# Patient Record
Sex: Female | Born: 1950 | Race: White | Hispanic: No | Marital: Married | Smoking: Never smoker
Health system: Southern US, Community
[De-identification: ages and names within clinical notes are randomized; demographics above are authoritative.]

## PROBLEM LIST (undated history)

## (undated) DIAGNOSIS — M8580 Other specified disorders of bone density and structure, unspecified site: Secondary | ICD-10-CM

## (undated) DIAGNOSIS — K219 Gastro-esophageal reflux disease without esophagitis: Secondary | ICD-10-CM

## (undated) DIAGNOSIS — E039 Hypothyroidism, unspecified: Secondary | ICD-10-CM

## (undated) DIAGNOSIS — S82892A Other fracture of left lower leg, initial encounter for closed fracture: Secondary | ICD-10-CM

## (undated) DIAGNOSIS — N200 Calculus of kidney: Secondary | ICD-10-CM

## (undated) DIAGNOSIS — I10 Essential (primary) hypertension: Secondary | ICD-10-CM

## (undated) DIAGNOSIS — R1013 Epigastric pain: Secondary | ICD-10-CM

## (undated) DIAGNOSIS — C541 Malignant neoplasm of endometrium: Secondary | ICD-10-CM

## (undated) DIAGNOSIS — E7800 Pure hypercholesterolemia, unspecified: Secondary | ICD-10-CM

## (undated) DIAGNOSIS — C801 Malignant (primary) neoplasm, unspecified: Secondary | ICD-10-CM

## (undated) DIAGNOSIS — Z87442 Personal history of urinary calculi: Secondary | ICD-10-CM

## (undated) DIAGNOSIS — F419 Anxiety disorder, unspecified: Secondary | ICD-10-CM

## (undated) DIAGNOSIS — E785 Hyperlipidemia, unspecified: Secondary | ICD-10-CM

## (undated) HISTORY — PX: ABDOMINAL HYSTERECTOMY: SHX81

## (undated) HISTORY — PX: DILATION AND CURETTAGE OF UTERUS: SHX78

## (undated) HISTORY — PX: GANGLION CYST EXCISION: SHX1691

## (undated) HISTORY — PX: EYE SURGERY: SHX253

## (undated) HISTORY — PX: KIDNEY STONE SURGERY: SHX686

## (undated) HISTORY — PX: EXTRACORPOREAL SHOCK WAVE LITHOTRIPSY: SHX1557

---

## 2002-12-13 HISTORY — PX: COLONOSCOPY WITH ESOPHAGOGASTRODUODENOSCOPY (EGD): SHX5779

## 2007-09-06 ENCOUNTER — Ambulatory Visit: Payer: Self-pay | Admitting: Obstetrics and Gynecology

## 2007-09-11 ENCOUNTER — Ambulatory Visit: Payer: Self-pay | Admitting: Internal Medicine

## 2007-09-15 ENCOUNTER — Ambulatory Visit: Payer: Self-pay | Admitting: Obstetrics and Gynecology

## 2007-09-18 ENCOUNTER — Ambulatory Visit: Payer: Self-pay | Admitting: Internal Medicine

## 2007-10-16 ENCOUNTER — Ambulatory Visit: Payer: Self-pay | Admitting: Obstetrics and Gynecology

## 2007-10-23 ENCOUNTER — Ambulatory Visit: Payer: Self-pay | Admitting: Obstetrics and Gynecology

## 2007-11-29 ENCOUNTER — Ambulatory Visit: Payer: Self-pay | Admitting: Obstetrics and Gynecology

## 2007-12-14 HISTORY — PX: TOTAL ABDOMINAL HYSTERECTOMY W/ BILATERAL SALPINGOOPHORECTOMY: SHX83

## 2008-01-09 ENCOUNTER — Ambulatory Visit: Payer: Self-pay | Admitting: Gynecologic Oncology

## 2008-01-11 ENCOUNTER — Ambulatory Visit: Payer: Self-pay | Admitting: Radiation Oncology

## 2008-01-14 ENCOUNTER — Ambulatory Visit: Payer: Self-pay | Admitting: Gynecologic Oncology

## 2008-01-14 ENCOUNTER — Ambulatory Visit: Payer: Self-pay | Admitting: Radiation Oncology

## 2008-02-11 ENCOUNTER — Ambulatory Visit: Payer: Self-pay | Admitting: Gynecologic Oncology

## 2008-02-11 ENCOUNTER — Ambulatory Visit: Payer: Self-pay | Admitting: Radiation Oncology

## 2008-03-13 ENCOUNTER — Ambulatory Visit: Payer: Self-pay | Admitting: Radiation Oncology

## 2008-04-22 ENCOUNTER — Inpatient Hospital Stay: Payer: Self-pay | Admitting: Surgery

## 2008-04-22 ENCOUNTER — Ambulatory Visit: Payer: Self-pay | Admitting: Internal Medicine

## 2008-05-02 ENCOUNTER — Ambulatory Visit: Payer: Self-pay | Admitting: Surgery

## 2008-05-06 ENCOUNTER — Ambulatory Visit: Payer: Self-pay | Admitting: Surgery

## 2008-05-10 ENCOUNTER — Ambulatory Visit: Payer: Self-pay | Admitting: Surgery

## 2008-05-28 ENCOUNTER — Ambulatory Visit: Payer: Self-pay | Admitting: Radiation Oncology

## 2008-07-01 ENCOUNTER — Ambulatory Visit: Payer: Self-pay | Admitting: Gynecologic Oncology

## 2008-07-09 ENCOUNTER — Ambulatory Visit: Payer: Self-pay | Admitting: Gynecologic Oncology

## 2008-10-13 ENCOUNTER — Ambulatory Visit: Payer: Self-pay | Admitting: Gynecologic Oncology

## 2008-10-29 ENCOUNTER — Ambulatory Visit: Payer: Self-pay | Admitting: Gynecologic Oncology

## 2008-11-29 ENCOUNTER — Ambulatory Visit: Payer: Self-pay | Admitting: Gynecologic Oncology

## 2009-01-13 ENCOUNTER — Ambulatory Visit: Payer: Self-pay | Admitting: Gynecologic Oncology

## 2009-01-24 ENCOUNTER — Ambulatory Visit: Payer: Self-pay | Admitting: Gynecologic Oncology

## 2009-02-04 ENCOUNTER — Ambulatory Visit: Payer: Self-pay | Admitting: Gynecologic Oncology

## 2009-06-02 ENCOUNTER — Ambulatory Visit: Payer: Self-pay | Admitting: Gynecologic Oncology

## 2009-06-03 ENCOUNTER — Ambulatory Visit: Payer: Self-pay | Admitting: Gynecologic Oncology

## 2009-06-12 ENCOUNTER — Ambulatory Visit: Payer: Self-pay | Admitting: Gynecologic Oncology

## 2009-06-30 ENCOUNTER — Ambulatory Visit: Payer: Self-pay | Admitting: Urology

## 2009-09-12 ENCOUNTER — Ambulatory Visit: Payer: Self-pay | Admitting: Gynecologic Oncology

## 2009-09-30 ENCOUNTER — Ambulatory Visit: Payer: Self-pay | Admitting: Gynecologic Oncology

## 2009-10-13 ENCOUNTER — Ambulatory Visit: Payer: Self-pay | Admitting: Gynecologic Oncology

## 2009-12-03 ENCOUNTER — Ambulatory Visit: Payer: Self-pay | Admitting: Gynecologic Oncology

## 2010-01-23 ENCOUNTER — Ambulatory Visit: Payer: Self-pay | Admitting: Gynecologic Oncology

## 2010-02-10 ENCOUNTER — Ambulatory Visit: Payer: Self-pay | Admitting: Gynecologic Oncology

## 2010-05-13 ENCOUNTER — Ambulatory Visit: Payer: Self-pay | Admitting: Gynecologic Oncology

## 2010-05-29 ENCOUNTER — Ambulatory Visit: Payer: Self-pay | Admitting: Gynecologic Oncology

## 2010-06-12 ENCOUNTER — Ambulatory Visit: Payer: Self-pay | Admitting: Gynecologic Oncology

## 2010-08-26 ENCOUNTER — Ambulatory Visit: Payer: Self-pay | Admitting: Urology

## 2010-10-16 DIAGNOSIS — Z86018 Personal history of other benign neoplasm: Secondary | ICD-10-CM

## 2010-10-16 HISTORY — DX: Personal history of other benign neoplasm: Z86.018

## 2010-11-02 ENCOUNTER — Ambulatory Visit: Payer: Self-pay | Admitting: Internal Medicine

## 2010-12-04 ENCOUNTER — Ambulatory Visit: Payer: Self-pay | Admitting: Radiation Oncology

## 2010-12-06 LAB — CA 125: CA 125: 12.3 U/mL (ref 0.0–34.0)

## 2010-12-13 ENCOUNTER — Ambulatory Visit: Payer: Self-pay | Admitting: Radiation Oncology

## 2010-12-25 ENCOUNTER — Ambulatory Visit: Payer: Self-pay | Admitting: Internal Medicine

## 2011-06-11 ENCOUNTER — Ambulatory Visit: Payer: Self-pay | Admitting: Gynecologic Oncology

## 2011-06-12 LAB — CA 125: CA 125: 14.8 U/mL (ref 0.0–34.0)

## 2011-06-13 ENCOUNTER — Ambulatory Visit: Payer: Self-pay | Admitting: Gynecologic Oncology

## 2011-06-15 ENCOUNTER — Ambulatory Visit: Payer: Self-pay | Admitting: Internal Medicine

## 2011-07-14 ENCOUNTER — Ambulatory Visit: Payer: Self-pay

## 2011-07-14 ENCOUNTER — Ambulatory Visit: Payer: Self-pay | Admitting: Gynecologic Oncology

## 2011-12-24 ENCOUNTER — Ambulatory Visit: Payer: Self-pay | Admitting: Gynecologic Oncology

## 2012-01-14 ENCOUNTER — Ambulatory Visit: Payer: Self-pay | Admitting: Gynecologic Oncology

## 2012-01-21 ENCOUNTER — Ambulatory Visit: Payer: Self-pay | Admitting: Internal Medicine

## 2012-06-26 ENCOUNTER — Ambulatory Visit: Payer: Self-pay | Admitting: Urology

## 2012-06-26 ENCOUNTER — Ambulatory Visit: Payer: Self-pay | Admitting: Gynecologic Oncology

## 2012-06-27 LAB — CA 125: CA 125: 15.2 U/mL (ref 0.0–34.0)

## 2012-07-13 ENCOUNTER — Ambulatory Visit: Payer: Self-pay | Admitting: Gynecologic Oncology

## 2012-08-07 DIAGNOSIS — R1011 Right upper quadrant pain: Secondary | ICD-10-CM | POA: Insufficient documentation

## 2012-08-07 DIAGNOSIS — R3129 Other microscopic hematuria: Secondary | ICD-10-CM | POA: Insufficient documentation

## 2012-08-07 DIAGNOSIS — R310 Gross hematuria: Secondary | ICD-10-CM | POA: Insufficient documentation

## 2012-08-07 DIAGNOSIS — N390 Urinary tract infection, site not specified: Secondary | ICD-10-CM | POA: Insufficient documentation

## 2012-08-07 DIAGNOSIS — C549 Malignant neoplasm of corpus uteri, unspecified: Secondary | ICD-10-CM | POA: Insufficient documentation

## 2012-08-07 DIAGNOSIS — N200 Calculus of kidney: Secondary | ICD-10-CM | POA: Insufficient documentation

## 2012-12-08 ENCOUNTER — Ambulatory Visit: Payer: Self-pay | Admitting: Internal Medicine

## 2012-12-13 ENCOUNTER — Ambulatory Visit: Payer: Self-pay | Admitting: Gynecologic Oncology

## 2012-12-13 HISTORY — PX: KNEE ARTHROSCOPY: SUR90

## 2012-12-13 HISTORY — PX: COLONOSCOPY: SHX174

## 2012-12-13 HISTORY — PX: CHOLECYSTECTOMY: SHX55

## 2012-12-18 ENCOUNTER — Ambulatory Visit: Payer: Self-pay | Admitting: Anesthesiology

## 2012-12-18 LAB — POTASSIUM: Potassium: 4 mmol/L (ref 3.5–5.1)

## 2012-12-22 ENCOUNTER — Ambulatory Visit: Payer: Self-pay | Admitting: General Practice

## 2013-01-02 LAB — CA 125: CA 125: 14.5 U/mL (ref 0.0–34.0)

## 2013-01-13 ENCOUNTER — Ambulatory Visit: Payer: Self-pay | Admitting: Gynecologic Oncology

## 2013-01-22 ENCOUNTER — Ambulatory Visit: Payer: Self-pay | Admitting: Internal Medicine

## 2013-03-14 ENCOUNTER — Ambulatory Visit: Payer: Self-pay | Admitting: Internal Medicine

## 2013-07-12 ENCOUNTER — Ambulatory Visit: Payer: Self-pay | Admitting: Internal Medicine

## 2013-08-22 ENCOUNTER — Ambulatory Visit: Payer: Self-pay | Admitting: Urology

## 2013-09-18 ENCOUNTER — Ambulatory Visit: Payer: Self-pay | Admitting: Surgery

## 2013-09-18 LAB — HEPATIC FUNCTION PANEL A (ARMC)
Bilirubin, Direct: <0.1 mg/dL (ref 0.00–0.20)
Bilirubin,Total: 0.2 mg/dL (ref 0.2–1.0)
SGOT(AST): 27 U/L (ref 15–37)
SGPT (ALT): 27 U/L (ref 12–78)
Total Protein: 7.5 g/dL (ref 6.4–8.2)

## 2013-09-25 ENCOUNTER — Ambulatory Visit: Payer: Self-pay | Admitting: Surgery

## 2013-10-18 LAB — PATHOLOGY REPORT

## 2013-10-22 ENCOUNTER — Ambulatory Visit: Payer: Self-pay | Admitting: Gastroenterology

## 2013-10-23 LAB — PATHOLOGY REPORT

## 2013-12-18 ENCOUNTER — Ambulatory Visit: Payer: Self-pay | Admitting: Gynecologic Oncology

## 2014-01-13 ENCOUNTER — Ambulatory Visit: Payer: Self-pay | Admitting: Gynecologic Oncology

## 2014-01-31 ENCOUNTER — Ambulatory Visit: Payer: Self-pay | Admitting: Internal Medicine

## 2014-02-12 ENCOUNTER — Ambulatory Visit: Payer: Self-pay | Admitting: Internal Medicine

## 2014-06-19 DIAGNOSIS — N23 Unspecified renal colic: Secondary | ICD-10-CM | POA: Insufficient documentation

## 2015-02-03 ENCOUNTER — Ambulatory Visit: Payer: Self-pay | Admitting: Internal Medicine

## 2015-04-04 NOTE — Op Note (Signed)
PATIENT NAME:  Morgan Dalton, Morgan Dalton MR#:  [id number] DATE OF BIRTH:  Jun 13, 1951  DATE OF PROCEDURE:  09/25/2013  PREOPERATIVE DIAGNOSIS: Cholecystitis.   POSTOPERATIVE DIAGNOSIS: Cholecystitis.   OPERATION: Robotic-assisted laparoscopic cholecystectomy.   ANESTHESIA: General.   SURGEON: Rodena Goldmann, MD.   OPERATIVE PROCEDURE: With the patient in the supine position after the induction of appropriate general anesthesia, the patient's abdomen was prepped with ChloraPrep and draped with sterile towels. The patient was placed in the head-down, feet-up position.   A small infraumbilical incision was made in the standard fashion, carried down bluntly through the subcutaneous tissue. A Veress needle was used to cannulate the peritoneal cavity. CO2 was insufflated to appropriate pressure measurements. When approximately 2.5 liters of CO2 was  instilled, the Veress needle was withdrawn and the 10 mm robotic port inserted that without difficulty.   Intraperitoneal position was confirmed. CO2 was re-insufflated. Two lateral robotic ports 8.5 mm in diameter were inserted under direct vision and a 5 mm assistant port inserted in the right upper quadrant. The robot was then docked to the ports and the instruments inserted under direct vision.   I then moved to the console. The gallbladder was grasped and retracted superiorly and laterally, exposing hepatoduodenal ligament. The cystic artery and cystic duct were identified. The cystic duct was visualized with the IC-Green dye. The cystic duct was doubly clipped on the common duct side and divided. The cystic artery was singly clipped on both sides and divided. The gallbladder was dissected free from its bed with the hook and cautery apparatus.   Once the gallbladder was free, the midline was inspected. The camera was moved to better identify the midline. There had been a large adhesion in the middle which we had to work around. There did not appear to be significant  damage to the abdominal wall, either to the adhesions or to the infraumbilical area. No significant bleeding was encountered. The robot had been undocked successfully. The gallbladder was then grasped through the umbilical port and brought up through the umbilical port without difficulty. The area was copiously suctioned and irrigated. The abdomen was then desufflated. All ports were withdrawn without difficulty. Skin incisions were closed with 5-0 nylon. The area was infiltrated with 0.25% Marcaine for postoperative pain control. Sterile dressings were applied. The patient returned to the recovery room having tolerated the procedure well. Sponge, instrument and needle counts were correct x2 in the operating room.    ____________________________ Rodena Goldmann III, MD rle:np D: 09/25/2013 15:11:08 ET T: 09/25/2013 15:42:55 ET JOB#: [id number]  cc: Micheline Maze, MD, <Dictator> Adin Hector, MD Rodena Goldmann MD ELECTRONICALLY SIGNED 09/29/2013 17:46

## 2015-04-04 NOTE — Op Note (Signed)
PATIENT NAME:  Morgan Dalton, Morgan Dalton MR#:  [id number] DATE OF BIRTH:  February 25, 1951  DATE OF PROCEDURE:  12/22/2012  PREOPERATIVE DIAGNOSIS: Internal derangement of the left knee.   POSTOPERATIVE DIAGNOSES: 1. Tear of posterior horn of medial meniscus, left knee.  2. 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   PROCEDURES PERFORMED:  1. Left knee arthroscopy.  2. Partial medial meniscectomy.  3. Medial chondroplasty.   SURGEON: Laurice Record. Hooten.   ANESTHESIA: General.   ESTIMATED BLOOD LOSS: Minimal.   TOURNIQUET TIME: Not used.   DRAINS: None.   INDICATIONS FOR SURGERY: The patient is a 64 year old female who has been seen for complaints of left knee pain. MRI demonstrated findings consistent with meniscal pathology. After discussion of the risks and benefits of surgical intervention, the patient expressed her understanding of the risks and benefits and agreed with plans for surgical intervention.   PROCEDURE IN DETAIL: The patient was brought into the operating room and, after adequate general anesthesia was achieved, a tourniquet was placed on the patient's left thigh and the leg was placed in a leg holder. All bony prominences were well padded. The patient's left knee and leg were cleaned and prepped with alcohol and DuraPrep and draped in the usual sterile fashion. A "timeout" was performed as per usual protocol. The anticipated portal sites were injected with 0.25% Marcaine with epinephrine. An anterolateral portal was created and cannula was inserted. A small effusion was evacuated. The scope was inserted and the knee was distended with fluid using the Stryker pump. The scope was advanced down the medial gutter into the medial compartment of the knee. Under visualization with the scope, an anteromedial portal was created and hook probe was inserted. Inspection of the medial compartment demonstrated a flap-type lesion involving the posterior horn of the medial meniscus. A  portion had retracted under the meniscus and this was pulled forward using the probe. The tear was debrided using meniscal punches and a 4.5 mm shaver. Final contouring was performed using the 50 degree ArthroCare wand. The anterior horn was visualized and probed and felt to be stable. Inspection of the articular cartilage demonstrated grade II to III changes of chondromalacia involving the medial femoral condyle. These areas were debrided and contoured using the ArthroCare wand. Scope was advanced into the intercondylar region. The anterior cruciate ligament was visualized and probed and felt to be stable. The scope was removed from the anterolateral portal and reinserted via the anteromedial portal so as to better visualize the lateral compartment. The articular surface of the lateral compartment was in excellent condition. The lateral meniscus was visualized and probed and felt to be stable. Finally, the scope was positioned so as to visualize the patellofemoral articulation. The articular surface was in good condition. Good patellar tracking was noted.   The knee was irrigated with copious amounts of fluid and then suctioned dry. The anterolateral portal was reapproximated using #3-0 nylon. A combination of 0.25% Marcaine with epinephrine and 4 mg of morphine was injected via the scope. The scope was removed and the anteromedial portal was reapproximated using #3-0 nylon. A sterile dressing was applied followed by application of ice wrap.   The patient tolerated the procedure well. She was transported to the recovery room in stable condition.  ____________________________ Laurice Record. Holley Bouche., MD jph:sb D: 12/22/2012 08:53:18 ET T: 12/22/2012 09:34:31 ET JOB#: [id number]  cc: Jeneen Rinks P. Holley Bouche., MD, <Dictator> Laurice Record Holley Bouche MD ELECTRONICALLY SIGNED 12/22/2012 16:35

## 2015-05-09 ENCOUNTER — Encounter: Payer: Self-pay | Admitting: Emergency Medicine

## 2015-05-09 ENCOUNTER — Other Ambulatory Visit: Payer: Self-pay

## 2015-05-09 ENCOUNTER — Emergency Department: Payer: Medicare Other

## 2015-05-09 ENCOUNTER — Emergency Department
Admission: EM | Admit: 2015-05-09 | Discharge: 2015-05-10 | Disposition: A | Discharge status: Home / Self Care | Payer: Medicare Other | Attending: Emergency Medicine | Admitting: Emergency Medicine

## 2015-05-09 DIAGNOSIS — I10 Essential (primary) hypertension: Secondary | ICD-10-CM | POA: Insufficient documentation

## 2015-05-09 DIAGNOSIS — M5412 Radiculopathy, cervical region: Secondary | ICD-10-CM | POA: Diagnosis not present

## 2015-05-09 DIAGNOSIS — R079 Chest pain, unspecified: Secondary | ICD-10-CM | POA: Insufficient documentation

## 2015-05-09 HISTORY — DX: Essential (primary) hypertension: I10

## 2015-05-09 LAB — CBC
HCT: 39.1 % (ref 36.0–46.0)
Hemoglobin: 12.9 g/dL (ref 12.0–15.0)
MCH: 29.1 pg (ref 26.0–34.0)
MCHC: 33 g/dL (ref 30.0–36.0)
MCV: 88.2 fL (ref 78.0–100.0)
Platelets: 361 10*3/uL (ref 150–400)
RBC: 4.43 MIL/uL (ref 3.87–5.11)
RDW: 13.4 % (ref 11.5–15.5)
WBC: 7.9 10*3/uL (ref 4.0–10.5)

## 2015-05-09 NOTE — ED Notes (Signed)
Pt presents to ED with c/o pain that began in her left arm, shoulder and upper back on Wednesday. Pt reports that last night and this morning pain began to move into left chest and up into left neck. Pt denies shortness of breath, denies diaphoresis. Reports nausea today. Pt is awake and alert during triage.

## 2015-05-09 NOTE — ED Notes (Signed)
Called lab to check on results of chemistry and troponin.

## 2015-05-09 NOTE — ED Provider Notes (Signed)
[facility] Emergency Department Provider Note  ____________________________________________  Time seen: Approximately 0093 PM  I have reviewed the triage vital signs and the nursing notes.   HISTORY  Chief Complaint Chest Pain    HPI Morgan Dalton is a 64 y.o. female with a history of hypertension who presents with 3 days of aching left arm pain. Said the pain started on Wednesday and has been mild to moderate and aching. It is migrated from the left arm up to the left neck and thoracic back and chest. There are no exacerbating factors. It does not worsen with movement or exertion. The patient that she takes several mile walk this morning without any shortness of breath or worsening of the pain. She said she did take a Flexeril last night which helped her sleep through the night but then she woke up feeling sick. She does admit been secondary to the Flexeril. She's had mild nausea today but no other symptoms. She's not had any tingling or numbness. She does not note any recent increased lifting or activity. Says she thinks she may have a history of arthritis. Sees Dr. Kristine Linea for primary care and takes hydrochlorothiazide daily for high blood pressure. She said that her mother had a heart attack but not till she was 66 years old. There is otherwise no history of coronary artery disease in the family. She is not having any pain with deep breathing.   Past Medical History  Diagnosis Date  . Hypertension     There are no active problems to display for this patient.   Past Surgical History  Procedure Laterality Date  . Abdominal hysterectomy    . Cholecystectomy      No current outpatient prescriptions on file.  Allergies Review of patient's allergies indicates no known allergies.  No family history on file.  Social History History  Substance Use Topics  . Smoking status: Never Smoker   . Smokeless tobacco: Not on file  . Alcohol Use: No     Review of Systems Constitutional: No fever/chills Eyes: No visual changes. ENT: No sore throat. Cardiovascular: As above Respiratory: Denies shortness of breath. Gastrointestinal: No abdominal pain.  No nausea, no vomiting.  No diarrhea.  No constipation. Genitourinary: Negative for dysuria. Musculoskeletal: Negative for back pain. Skin: Negative for rash. Neurological: Negative for headaches, focal weakness or numbness.  10-point ROS otherwise negative.  ____________________________________________   PHYSICAL EXAM:  VITAL SIGNS: ED Triage Vitals  Enc Vitals Group     BP 05/09/15 1924 185/84 mmHg     Pulse Rate 05/09/15 1924 77     Resp 05/09/15 1924 18     Temp 05/09/15 1924 98.3 F (36.8 C)     Temp Source 05/09/15 1924 Oral     SpO2 05/09/15 1924 100 %     Weight 05/09/15 1924 190 lb (86.183 kg)     Height 05/09/15 1924 5\' 4"  (1.626 m)     Head Cir --      Peak Flow --      Pain Score 05/09/15 1925 3     Pain Loc --      Pain Edu? --      Excl. in [place]? --     Constitutional: Alert and oriented. Well appearing and in no acute distress. Eyes: Conjunctivae are normal. PERRL. EOMI. Head: Atraumatic. Nose: No congestion/rhinnorhea. Mouth/Throat: Mucous membranes are moist.  Oropharynx non-erythematous. Neck: No stridor.   Cardiovascular: Normal rate, regular rhythm. Grossly normal heart  sounds.  Good peripheral circulation. Equal bilateral radial pulses. Respiratory: Normal respiratory effort.  No retractions. Lungs CTAB. No chest wall tenderness to palpation. Gastrointestinal: Soft and nontender. No distention. No abdominal bruits. No CVA tenderness. Musculoskeletal: No lower extremity tenderness nor edema.  No joint effusions. Neurologic:  Normal speech and language. No gross focal neurologic deficits are appreciated. Speech is normal. No gait instability. Skin:  Skin is warm, dry and intact. No rash noted. Psychiatric: Mood and affect are normal. Speech and  behavior are normal.  ____________________________________________   LABS (all labs ordered are listed, but only abnormal results are displayed)  Labs Reviewed  BASIC METABOLIC PANEL - Abnormal; Notable for the following:    Glucose, Bld 143 (*)    All other components within normal limits  BASIC METABOLIC PANEL - Abnormal; Notable for the following:    Glucose, Bld 108 (*)    All other components within normal limits  TROPONIN I  CBC  TROPONIN I   ____________________________________________  EKG  ED ECG REPORT I, Doran Stabler, the attending physician, personally viewed and interpreted this ECG.   Date: 05/10/2015  EKG Time: 1921  Rate: 76  Rhythm: normal sinus rhythm  Axis: Normal axis  Intervals: No abnormality  ST&T Change: Biphasic T-wave in aVL. There is no previous for comparison. Read is as possible left atrial enlargement with a borderline EKG.  ____________________________________________  RADIOLOGY  Chest x-ray NAD. ____________________________________________   PROCEDURES    ____________________________________________   INITIAL IMPRESSION / ASSESSMENT AND PLAN / ED COURSE  Pertinent labs & imaging results that were available during my care of the patient were reviewed by me and considered in my medical decision making (see chart for details).  Patient with story more consistent with radiculopathy. We'll give Naprosyn. Awaiting BMP and troponin. No shortness of breath. No exertional symptoms. As long as labs are normal we'll likely discharge and have follow-up with her primary care doctor.  ----------------------------------------- 12:42 AM on 05/10/2015 -----------------------------------------  Patient is resting comfortably. We'll be discharged home. Reassuring labs and EKG. Feel this is likely related to radiculopathy. However, given age instructed the patient to follow-up with her primary care doctor early next week. Discussed  possible need for stress test as the patient is in her mid 80s. Patient will take Aleve twice a day for 3-5 days for pain relief. We'll also try muscle cream such as as he had or BenGay at home. Furthermore do not suspect PE because no shortness of breath or pleuritic chest pain. Unlikely dissection. Equal pulses throughout and normal chest x-ray. ____________________________________________   FINAL CLINICAL IMPRESSION(S) / ED DIAGNOSES  Acute chest pain. Acute cervical radiculopathy. Initial visit.    Orbie Pyo, MD 05/10/15 [phone]

## 2015-05-10 DIAGNOSIS — R079 Chest pain, unspecified: Secondary | ICD-10-CM | POA: Diagnosis not present

## 2015-05-10 LAB — BASIC METABOLIC PANEL
Anion gap: 10 (ref 5–15)
Anion gap: 9 (ref 5–15)
BUN: 19 mg/dL (ref 6–20)
BUN: 20 mg/dL (ref 6–20)
CO2: 27 mmol/L (ref 22–32)
CO2: 28 mmol/L (ref 22–32)
Calcium: 9.3 mg/dL (ref 8.9–10.3)
Calcium: 9.4 mg/dL (ref 8.9–10.3)
Chloride: 103 mmol/L (ref 101–111)
Chloride: 104 mmol/L (ref 101–111)
Creatinine, Ser: 0.79 mg/dL (ref 0.44–1.00)
Creatinine, Ser: 0.87 mg/dL (ref 0.44–1.00)
GFR calc Af Amer: >60 mL/min (ref >60)
GFR calc Af Amer: >60 mL/min (ref >60)
GFR calc non Af Amer: >60 mL/min (ref >60)
GLUCOSE: 108 mg/dL — AB (ref 65–99)
Glucose, Bld: 143 mg/dL — ABNORMAL HIGH (ref 65–99)
Potassium: 4 mmol/L (ref 3.5–5.1)
Potassium: 4.1 mmol/L (ref 3.5–5.1)
Sodium: 140 mmol/L (ref 135–145)
Sodium: 141 mmol/L (ref 135–145)

## 2015-05-10 LAB — TROPONIN I
Troponin I: <0.03 ng/mL (ref <0.031)
Troponin I: <0.03 ng/mL (ref <0.031)

## 2015-05-10 MED ORDER — NAPROXEN 500 MG PO TABS
ORAL_TABLET | ORAL | Status: AC
  Administered 2015-05-10: 500 mg via ORAL
  Filled 2015-05-10: qty 1

## 2015-05-10 MED ORDER — NAPROXEN 500 MG PO TABS
500.0000 mg | ORAL_TABLET | Freq: Once | ORAL | Status: AC
  Administered 2015-05-10: 500 mg via ORAL

## 2015-05-10 NOTE — Discharge Instructions (Signed)

## 2015-11-25 ENCOUNTER — Emergency Department
Admission: EM | Admit: 2015-11-25 | Discharge: 2015-11-25 | Disposition: A | Discharge status: Home / Self Care | Payer: Medicare Other | Attending: Emergency Medicine | Admitting: Emergency Medicine

## 2015-11-25 ENCOUNTER — Emergency Department: Payer: Medicare Other

## 2015-11-25 ENCOUNTER — Encounter: Payer: Self-pay | Admitting: Emergency Medicine

## 2015-11-25 DIAGNOSIS — Z7982 Long term (current) use of aspirin: Secondary | ICD-10-CM | POA: Diagnosis not present

## 2015-11-25 DIAGNOSIS — Z79899 Other long term (current) drug therapy: Secondary | ICD-10-CM | POA: Insufficient documentation

## 2015-11-25 DIAGNOSIS — R109 Unspecified abdominal pain: Secondary | ICD-10-CM | POA: Diagnosis present

## 2015-11-25 DIAGNOSIS — N200 Calculus of kidney: Secondary | ICD-10-CM | POA: Insufficient documentation

## 2015-11-25 DIAGNOSIS — N23 Unspecified renal colic: Secondary | ICD-10-CM | POA: Diagnosis not present

## 2015-11-25 DIAGNOSIS — I10 Essential (primary) hypertension: Secondary | ICD-10-CM | POA: Diagnosis not present

## 2015-11-25 HISTORY — DX: Calculus of kidney: N20.0

## 2015-11-25 LAB — URINALYSIS COMPLETE WITH MICROSCOPIC (ARMC ONLY)
BACTERIA UA: NONE SEEN
BILIRUBIN URINE: NEGATIVE
Glucose, UA: NEGATIVE mg/dL
HGB URINE DIPSTICK: NEGATIVE
KETONES UR: NEGATIVE mg/dL
NITRITE: NEGATIVE
PROTEIN: 30 mg/dL — AB
Specific Gravity, Urine: 1.023 (ref 1.005–1.030)
pH: 5 (ref 5.0–8.0)

## 2015-11-25 LAB — BASIC METABOLIC PANEL
Anion gap: 10 (ref 5–15)
BUN: 20 mg/dL (ref 6–20)
CALCIUM: 9.1 mg/dL (ref 8.9–10.3)
CO2: 26 mmol/L (ref 22–32)
CREATININE: 0.88 mg/dL (ref 0.44–1.00)
Chloride: 103 mmol/L (ref 101–111)
GFR calc non Af Amer: >60 mL/min (ref >60)
GLUCOSE: 138 mg/dL — AB (ref 65–99)
Potassium: 3.9 mmol/L (ref 3.5–5.1)
Sodium: 139 mmol/L (ref 135–145)

## 2015-11-25 MED ORDER — SODIUM CHLORIDE 0.9 % IV BOLUS (SEPSIS)
1000.0000 mL | Freq: Once | INTRAVENOUS | Status: AC
  Administered 2015-11-25: 1000 mL via INTRAVENOUS

## 2015-11-25 MED ORDER — ONDANSETRON HCL 4 MG/2ML IJ SOLN
4.0000 mg | Freq: Once | INTRAMUSCULAR | Status: AC
  Administered 2015-11-25: 4 mg via INTRAVENOUS

## 2015-11-25 MED ORDER — NAPROXEN 500 MG PO TABS: 500.0000 mg | ORAL_TABLET | Freq: Two times a day (BID) | ORAL | Status: DC

## 2015-11-25 MED ORDER — OXYCODONE-ACETAMINOPHEN 5-325 MG PO TABS: 1.0000 | ORAL_TABLET | Freq: Four times a day (QID) | ORAL | Status: DC | PRN

## 2015-11-25 MED ORDER — TAMSULOSIN HCL 0.4 MG PO CAPS: 0.4000 mg | ORAL_CAPSULE | Freq: Every day | ORAL | Status: DC

## 2015-11-25 MED ORDER — KETOROLAC TROMETHAMINE 30 MG/ML IJ SOLN
30.0000 mg | Freq: Once | INTRAMUSCULAR | Status: AC
  Administered 2015-11-25: 30 mg via INTRAVENOUS

## 2015-11-25 MED ORDER — KETOROLAC TROMETHAMINE 30 MG/ML IJ SOLN
INTRAMUSCULAR | Status: AC
  Administered 2015-11-25: 30 mg via INTRAVENOUS
  Filled 2015-11-25: qty 1

## 2015-11-25 MED ORDER — ONDANSETRON HCL 4 MG/2ML IJ SOLN
INTRAMUSCULAR | Status: AC
  Administered 2015-11-25: 4 mg via INTRAVENOUS
  Filled 2015-11-25: qty 2

## 2015-11-25 MED ORDER — ONDANSETRON 8 MG PO TBDP: 8.0000 mg | ORAL_TABLET | Freq: Three times a day (TID) | ORAL | Status: DC | PRN

## 2015-11-25 NOTE — Discharge Instructions (Signed)
You were prescribed a medication that is potentially sedating. Do not drink alcohol, drive or participate in any other potentially dangerous activities while taking this medication as it may make you sleepy. Do not take this medication with any other sedating medications, either prescription or over-the-counter. If you were prescribed Percocet or Vicodin, do not take these with acetaminophen (Tylenol) as it is already contained within these medications. °  °Opioid pain medications (or "narcotics") can be habit forming.  Use it as little as possible to achieve adequate pain control.  Do not use or use it with extreme caution if you have a history of opiate abuse or dependence.  If you are on a pain contract with your primary care doctor or a pain specialist, be sure to let them know you were prescribed this medication today from the [facility].  This medication is intended for your use only - do not give any to anyone else and keep it in a secure place where nobody else, especially children and pets, have access to it.  It will also cause or worsen constipation, so you may want to consider taking an over-the-counter stool softener while you are taking this medication. ° °Kidney Stones °Kidney stones (urolithiasis) are deposits that form inside your kidneys. The intense pain is caused by the stone moving through the urinary tract. When the stone moves, the ureter goes into spasm around the stone. The stone is usually passed in the urine.  °CAUSES  °· A disorder that makes certain neck glands produce too much parathyroid hormone (primary hyperparathyroidism). °· A buildup of uric acid crystals, similar to gout in your joints. °· Narrowing (stricture) of the ureter. °· A kidney obstruction present at birth (congenital obstruction). °· Previous surgery on the kidney or ureters. °· Numerous kidney infections. °SYMPTOMS  °· Feeling sick to your stomach (nauseous). °· Throwing up  (vomiting). °· Blood in the urine (hematuria). °· Pain that usually spreads (radiates) to the groin. °· Frequency or urgency of urination. °DIAGNOSIS  °· Taking a history and physical exam. °· Blood or urine tests. °· CT scan. °· Occasionally, an examination of the inside of the urinary bladder (cystoscopy) is performed. °TREATMENT  °· Observation. °· Increasing your fluid intake. °· Extracorporeal shock wave lithotripsy--This is a noninvasive procedure that uses shock waves to break up kidney stones. °· Surgery may be needed if you have severe pain or persistent obstruction. There are various surgical procedures. Most of the procedures are performed with the use of small instruments. Only small incisions are needed to accommodate these instruments, so recovery time is minimized. °The size, location, and chemical composition are all important variables that will determine the proper choice of action for you. Talk to your health care provider to better understand your situation so that you will minimize the risk of injury to yourself and your kidney.  °HOME CARE INSTRUCTIONS  °· Drink enough water and fluids to keep your urine clear or pale yellow. This will help you to pass the stone or stone fragments. °· Strain all urine through the provided strainer. Keep all particulate matter and stones for your health care provider to see. The stone causing the pain may be as small as a grain of salt. It is very important to use the strainer each and every time you pass your urine. The collection of your stone will allow your health care provider to analyze it and verify that a stone has actually passed. The stone analysis will often   identify what you can do to reduce the incidence of recurrences. °· Only take over-the-counter or prescription medicines for pain, discomfort, or fever as directed by your health care provider. °· Keep all follow-up visits as told by your health care provider. This is important. °· Get follow-up  X-rays if required. The absence of pain does not always mean that the stone has passed. It may have only stopped moving. If the urine remains completely obstructed, it can cause loss of kidney function or even complete destruction of the kidney. It is your responsibility to make sure X-rays and follow-ups are completed. Ultrasounds of the kidney can show blockages and the status of the kidney. Ultrasounds are not associated with any radiation and can be performed easily in a matter of minutes. °· Make changes to your daily diet as told by your health care provider. You may be told to: °¨ Limit the amount of salt that you eat. °¨ Eat 5 or more servings of fruits and vegetables each day. °¨ Limit the amount of meat, poultry, fish, and eggs that you eat. °· Collect a 24-hour urine sample as told by your health care provider. You may need to collect another urine sample every 6-12 months. °SEEK MEDICAL CARE IF: °· You experience pain that is progressive and unresponsive to any pain medicine you have been prescribed. °SEEK IMMEDIATE MEDICAL CARE IF:  °· Pain cannot be controlled with the prescribed medicine. °· You have a fever or shaking chills. °· The severity or intensity of pain increases over 18 hours and is not relieved by pain medicine. °· You develop a new onset of abdominal pain. °· You feel faint or pass out. °· You are unable to urinate. °  °This information is not intended to replace advice given to you by your health care provider. Make sure you discuss any questions you have with your health care provider. °  °Document Released: 11/29/2005 Document Revised: 08/20/2015 Document Reviewed: 05/02/2013 °Elsevier Interactive Patient Education ©2016 [employer] ° °Renal Colic °Renal colic is pain that is caused by passing a kidney stone. The pain can be sharp and severe. It may be felt in the back, abdomen, side (flank), or groin. It can cause nausea. Renal colic can come and go. °HOME CARE INSTRUCTIONS °Watch  your condition for any changes. The following actions may help to lessen any discomfort that you are feeling: °· Take medicines only as directed by your health care provider. °· Ask your health care provider if it is okay to take over-the-counter pain medicine. °· Drink enough fluid to keep your urine clear or pale yellow. Drink 6-8 glasses of water each day. °· Limit the amount of salt that you eat to less than 2 grams per day. °· Reduce the amount of protein in your diet. Eat less meat, fish, nuts, and dairy. °· Avoid foods such as spinach, rhubarb, nuts, or bran. These may make kidney stones more likely to form. °SEEK MEDICAL CARE IF: °· You have a fever or chills. °· Your urine smells bad or looks cloudy. °· You have pain or burning when you pass urine. °SEEK IMMEDIATE MEDICAL CARE IF: °· Your flank pain or groin pain suddenly worsens. °· You become confused or disoriented or you lose consciousness. °  °This information is not intended to replace advice given to you by your health care provider. Make sure you discuss any questions you have with your health care provider. °  °Document Released: 09/08/2005 Document Revised: 12/20/2014 Document Reviewed: 10/09/2014 °  Elsevier Interactive Patient Education ©2016 [employer] ° °

## 2015-11-25 NOTE — ED Notes (Signed)
Pt here with c/o right flank pain. Tearful in triage, hx of kidney stones.

## 2015-11-25 NOTE — ED Provider Notes (Signed)
[facility] Emergency Department Provider Note  ____________________________________________  Time seen: 9:20 AM  I have reviewed the triage vital signs and the nursing notes.   HISTORY  Chief Complaint Flank Pain    HPI Morgan Dalton is a 64 y.o. female who complains of right flank pain that started this morning. Last night she was in her usual state of health. She has a history of right kidney stones that she is followed by Dr. cope with urology. She took Flomax and Percocet at home but has had severe ongoing pain. It is waxing and waning but constant, no urinary symptoms, no dizziness chest pain shortness of breath fevers or chills. Pain is in the right flank, sharp, radiates around to the right lower quadrant.     Past Medical History  Diagnosis Date  . Hypertension   . Kidney stone      There are no active problems to display for this patient.    Past Surgical History  Procedure Laterality Date  . Abdominal hysterectomy    . Cholecystectomy       Current Outpatient Rx  Name  Route  Sig  Dispense  Refill  . aspirin EC 81 MG tablet   Oral   Take 1 tablet by mouth daily.         Marland Kitchen atorvastatin (LIPITOR) 10 MG tablet   Oral   Take 1 tablet by mouth daily.      0   . Coenzyme Q10 (CO Q-10) 100 MG CAPS   Oral   Take 100 mg by mouth daily.         Marland Kitchen glucosamine-chondroitin (GLUCOSAMINE-CHONDROITIN DS) 500-400 MG tablet   Oral   Take 1 tablet by mouth daily.         . hydrochlorothiazide (MICROZIDE) 12.5 MG capsule   Oral   Take 1 capsule by mouth daily.         . Multiple Vitamins tablet   Oral   Take 1 tablet by mouth daily.         . Omega-3 Fatty Acids (FISH OIL) 1000 MG CAPS   Oral   Take 1,000 mg by mouth daily.         . OXYCODONE-ACETAMINOPHEN PO   Oral   Take 2 tablets by mouth as needed (PT states she took two this morning but her pharmacy didnt have any record of her filling in the past 90 days.).          Marland Kitchen pantoprazole (PROTONIX) 40 MG tablet   Oral   Take 1 tablet by mouth daily.         . tamsulosin (FLOMAX) 0.4 MG CAPS capsule   Oral   Take 1 capsule by mouth daily.         . naproxen (NAPROSYN) 500 MG tablet   Oral   Take 1 tablet (500 mg total) by mouth 2 (two) times daily with a meal.   20 tablet   0   . ondansetron (ZOFRAN ODT) 8 MG disintegrating tablet   Oral   Take 1 tablet (8 mg total) by mouth every 8 (eight) hours as needed for nausea or vomiting.   20 tablet   0   . oxyCODONE-acetaminophen (ROXICET) 5-325 MG tablet   Oral   Take 1 tablet by mouth every 6 (six) hours as needed for severe pain.   12 tablet   0   . tamsulosin (FLOMAX) 0.4 MG CAPS capsule   Oral  Take 1 capsule (0.4 mg total) by mouth daily.   30 capsule   0      Allergies Review of patient's allergies indicates no known allergies.   No family history on file.  Social History Social History  Substance Use Topics  . Smoking status: Never Smoker   . Smokeless tobacco: None  . Alcohol Use: No    Review of Systems  Constitutional:   No fever or chills. No weight changes Eyes:   No blurry vision or double vision.  ENT:   No sore throat. Cardiovascular:   No chest pain. Respiratory:   No dyspnea or cough. Gastrointestinal:   Negative for abdominal pain, vomiting and diarrhea.  No BRBPR or melena. Genitourinary:   Negative for dysuria, urinary retention, bloody urine, or difficulty urinating. Musculoskeletal:   Positive right flank pain Skin:   Negative for rash. Neurological:   Negative for headaches, focal weakness or numbness. Psychiatric:  No anxiety or depression.   Endocrine:  No hot/cold intolerance, changes in energy, or sleep difficulty.  10-point ROS otherwise negative.  ____________________________________________   PHYSICAL EXAM:  VITAL SIGNS: ED Triage Vitals  Enc Vitals Group     BP 11/25/15 0913 104/66 mmHg     Pulse Rate 11/25/15 0911 75      Resp 11/25/15 0911 18     Temp 11/25/15 0913 97.5 F (36.4 C)     Temp Source 11/25/15 0911 Oral     SpO2 11/25/15 0911 100 %     Weight 11/25/15 0911 195 lb (88.451 kg)     Height 11/25/15 0911 5\' 4"  (1.626 m)     Head Cir --      Peak Flow --      Pain Score 11/25/15 0911 9     Pain Loc --      Pain Edu? --      Excl. in [place]? --      Constitutional:   Alert and oriented. Mild distress due to pain  Eyes:   No scleral icterus. No conjunctival pallor. PERRL. EOMI ENT   Head:   Normocephalic and atraumatic.   Nose:   No congestion/rhinnorhea. No septal hematoma   Mouth/Throat:   MMM, no pharyngeal erythema. No peritonsillar mass. No uvula shift.   Neck:   No stridor. No SubQ emphysema. No meningismus. Hematological/Lymphatic/Immunilogical:   No cervical lymphadenopathy. Cardiovascular:   RRR. Normal and symmetric distal pulses are present in all extremities. No murmurs, rubs, or gallops. Respiratory:   Normal respiratory effort without tachypnea nor retractions. Breath sounds are clear and equal bilaterally. No wheezes/rales/rhonchi. Gastrointestinal:   Soft and nontender. No distention. There is no CVA tenderness.  No rebound, rigidity, or guarding. Genitourinary:   deferred Musculoskeletal:   Nontender with normal range of motion in all extremities. No joint effusions.  No lower extremity tenderness.  No edema. Neurologic:   Normal speech and language.  CN 2-10 normal. Motor grossly intact. No pronator drift.  Normal gait. No gross focal neurologic deficits are appreciated.  Skin:    Skin is warm, dry and intact. No rash noted.  No petechiae, purpura, or bullae. Psychiatric:   Mood and affect are normal. Speech and behavior are normal. Patient exhibits appropriate insight and judgment.  ____________________________________________    LABS (pertinent positives/negatives) (all labs ordered are listed, but only abnormal results are displayed) Labs Reviewed  BASIC  METABOLIC PANEL - Abnormal; Notable for the following:    Glucose, Bld 138 (*)    All  other components within normal limits  URINALYSIS COMPLETEWITH MICROSCOPIC ([facility] ONLY) - Abnormal; Notable for the following:    Color, Urine YELLOW (*)    APPearance CLEAR (*)    Protein, ur 30 (*)    Leukocytes, UA TRACE (*)    Squamous Epithelial / LPF 0-5 (*)    All other components within normal limits   ____________________________________________   EKG    ____________________________________________    RADIOLOGY  KUB shows right staghorn calculus without any ureterolithiasis.  ____________________________________________   PROCEDURES   ____________________________________________   INITIAL IMPRESSION / ASSESSMENT AND PLAN / ED COURSE  Pertinent labs & imaging results that were available during my care of the patient were reviewed by me and considered in my medical decision making (see chart for details).  Patient presents with right renal colic. Exam otherwise reassuring. Check urinalysis and creatinine, x-ray to evaluate for large ureterolithiasis.  ----------------------------------------- 12:09 PM on 11/25/2015 -----------------------------------------  Labs unremarkable, no UTI. Pain is controlled with Toradol here. No large stone on KUB. Likely a small stone that is not detectable on labs or has already passed. We'll get NSAIDs Percocet and Flomax and Zofran and have her follow-up with her urologist. No evidence of any intra-abdominal pathology such as appendicitis or perforation or obstruction. Low suspicion for AAA     ____________________________________________   FINAL CLINICAL IMPRESSION(S) / ED DIAGNOSES  Final diagnoses:  Renal colic on right side  Staghorn renal calculus      Carrie Mew, MD 11/25/15 1210

## 2015-11-25 NOTE — ED Notes (Signed)
Pt in x-ray at this time

## 2015-12-23 ENCOUNTER — Other Ambulatory Visit: Payer: Self-pay | Admitting: Obstetrics and Gynecology

## 2015-12-23 DIAGNOSIS — Z1231 Encounter for screening mammogram for malignant neoplasm of breast: Secondary | ICD-10-CM

## 2016-01-23 DIAGNOSIS — Z8542 Personal history of malignant neoplasm of other parts of uterus: Secondary | ICD-10-CM | POA: Insufficient documentation

## 2016-01-23 DIAGNOSIS — C541 Malignant neoplasm of endometrium: Secondary | ICD-10-CM | POA: Insufficient documentation

## 2016-02-05 ENCOUNTER — Ambulatory Visit
Admission: RE | Admit: 2016-02-05 | Discharge: 2016-02-05 | Disposition: A | Discharge status: Home / Self Care | Payer: Medicare Other | Source: Ambulatory Visit | Attending: Obstetrics and Gynecology | Admitting: Obstetrics and Gynecology

## 2016-02-05 ENCOUNTER — Other Ambulatory Visit: Payer: Self-pay | Admitting: Obstetrics and Gynecology

## 2016-02-05 DIAGNOSIS — Z1231 Encounter for screening mammogram for malignant neoplasm of breast: Secondary | ICD-10-CM

## 2016-02-05 HISTORY — DX: Malignant (primary) neoplasm, unspecified: C80.1

## 2016-07-23 DIAGNOSIS — F330 Major depressive disorder, recurrent, mild: Secondary | ICD-10-CM | POA: Insufficient documentation

## 2016-08-04 ENCOUNTER — Other Ambulatory Visit: Payer: Self-pay | Admitting: Obstetrics and Gynecology

## 2016-08-04 DIAGNOSIS — R102 Pelvic and perineal pain: Secondary | ICD-10-CM

## 2016-08-11 DIAGNOSIS — N3020 Other chronic cystitis without hematuria: Secondary | ICD-10-CM | POA: Insufficient documentation

## 2016-08-11 DIAGNOSIS — R3989 Other symptoms and signs involving the genitourinary system: Secondary | ICD-10-CM | POA: Insufficient documentation

## 2016-08-12 ENCOUNTER — Ambulatory Visit
Admission: RE | Admit: 2016-08-12 | Discharge: 2016-08-12 | Disposition: A | Discharge status: Home / Self Care | Payer: Medicare Other | Source: Ambulatory Visit | Attending: Obstetrics and Gynecology | Admitting: Obstetrics and Gynecology

## 2016-08-12 DIAGNOSIS — N200 Calculus of kidney: Secondary | ICD-10-CM | POA: Insufficient documentation

## 2016-08-12 DIAGNOSIS — R102 Pelvic and perineal pain: Secondary | ICD-10-CM

## 2016-08-12 DIAGNOSIS — I700 Atherosclerosis of aorta: Secondary | ICD-10-CM | POA: Insufficient documentation

## 2016-08-12 DIAGNOSIS — K760 Fatty (change of) liver, not elsewhere classified: Secondary | ICD-10-CM | POA: Diagnosis not present

## 2016-08-12 MED ORDER — IOPAMIDOL (ISOVUE-300) INJECTION 61%
100.0000 mL | Freq: Once | INTRAVENOUS | Status: AC | PRN
  Administered 2016-08-12: 100 mL via INTRAVENOUS

## 2016-09-09 DIAGNOSIS — D414 Neoplasm of uncertain behavior of bladder: Secondary | ICD-10-CM | POA: Insufficient documentation

## 2016-10-26 DIAGNOSIS — E039 Hypothyroidism, unspecified: Secondary | ICD-10-CM | POA: Insufficient documentation

## 2016-12-07 ENCOUNTER — Ambulatory Visit
Admission: RE | Admit: 2016-12-07 | Discharge: 2016-12-07 | Disposition: A | Discharge status: Home / Self Care | Payer: Medicare Other | Source: Ambulatory Visit | Attending: Ophthalmology | Admitting: Ophthalmology

## 2016-12-07 ENCOUNTER — Ambulatory Visit: Payer: Medicare Other | Admitting: Registered Nurse

## 2016-12-07 ENCOUNTER — Encounter
Admission: RE | Disposition: A | Discharge status: Home / Self Care | Payer: Self-pay | Source: Ambulatory Visit | Attending: Ophthalmology

## 2016-12-07 DIAGNOSIS — F419 Anxiety disorder, unspecified: Secondary | ICD-10-CM | POA: Diagnosis not present

## 2016-12-07 DIAGNOSIS — K219 Gastro-esophageal reflux disease without esophagitis: Secondary | ICD-10-CM | POA: Diagnosis not present

## 2016-12-07 DIAGNOSIS — H2511 Age-related nuclear cataract, right eye: Secondary | ICD-10-CM | POA: Diagnosis present

## 2016-12-07 DIAGNOSIS — I10 Essential (primary) hypertension: Secondary | ICD-10-CM | POA: Insufficient documentation

## 2016-12-07 DIAGNOSIS — Z79899 Other long term (current) drug therapy: Secondary | ICD-10-CM | POA: Diagnosis not present

## 2016-12-07 DIAGNOSIS — E039 Hypothyroidism, unspecified: Secondary | ICD-10-CM | POA: Insufficient documentation

## 2016-12-07 HISTORY — DX: Hypothyroidism, unspecified: E03.9

## 2016-12-07 HISTORY — DX: Other specified disorders of bone density and structure, unspecified site: M85.80

## 2016-12-07 HISTORY — DX: Pure hypercholesterolemia, unspecified: E78.00

## 2016-12-07 HISTORY — PX: CATARACT EXTRACTION W/PHACO: SHX586

## 2016-12-07 HISTORY — DX: Personal history of urinary calculi: Z87.442

## 2016-12-07 HISTORY — DX: Gastro-esophageal reflux disease without esophagitis: K21.9

## 2016-12-07 HISTORY — DX: Anxiety disorder, unspecified: F41.9

## 2016-12-07 SURGERY — PHACOEMULSIFICATION, CATARACT, WITH IOL INSERTION
Anesthesia: Monitor Anesthesia Care | Site: Eye | Laterality: Right | Wound class: Clean

## 2016-12-07 MED ORDER — EPINEPHRINE PF 1 MG/ML IJ SOLN
INTRAMUSCULAR | Status: DC | PRN
  Administered 2016-12-07: 10:00:00 via OPHTHALMIC

## 2016-12-07 MED ORDER — FENTANYL CITRATE (PF) 100 MCG/2ML IJ SOLN
INTRAMUSCULAR | Status: DC | PRN
  Administered 2016-12-07: 50 ug via INTRAVENOUS

## 2016-12-07 MED ORDER — MOXIFLOXACIN HCL 0.5 % OP SOLN
OPHTHALMIC | Status: AC
  Filled 2016-12-07: qty 3

## 2016-12-07 MED ORDER — MOXIFLOXACIN HCL 0.5 % OP SOLN
1.0000 [drp] | OPHTHALMIC | Status: AC | PRN
  Administered 2016-12-07 (×3): 1 [drp] via OPHTHALMIC

## 2016-12-07 MED ORDER — FENTANYL CITRATE (PF) 100 MCG/2ML IJ SOLN
INTRAMUSCULAR | Status: AC
  Filled 2016-12-07: qty 2

## 2016-12-07 MED ORDER — MIDAZOLAM HCL 2 MG/2ML IJ SOLN
INTRAMUSCULAR | Status: DC | PRN
  Administered 2016-12-07 (×2): 1 mg via INTRAVENOUS

## 2016-12-07 MED ORDER — MOXIFLOXACIN HCL 0.5 % OP SOLN
OPHTHALMIC | Status: DC | PRN
  Administered 2016-12-07: 1 [drp] via OPHTHALMIC

## 2016-12-07 MED ORDER — CARBACHOL 0.01 % IO SOLN
INTRAOCULAR | Status: DC | PRN
  Administered 2016-12-07: 0.5 mL via INTRAOCULAR

## 2016-12-07 MED ORDER — ARMC OPHTHALMIC DILATING DROPS
1.0000 "application " | OPHTHALMIC | Status: AC | PRN
  Administered 2016-12-07 (×3): 1 via OPHTHALMIC

## 2016-12-07 MED ORDER — SODIUM CHLORIDE 0.9 % IV SOLN
INTRAVENOUS | Status: DC
Start: 2016-12-07 — End: 2016-12-07
  Administered 2016-12-07: 09:00:00 via INTRAVENOUS

## 2016-12-07 MED ORDER — ARMC OPHTHALMIC DILATING DROPS
OPHTHALMIC | Status: AC
  Filled 2016-12-07: qty 0.4

## 2016-12-07 MED ORDER — NA CHONDROIT SULF-NA HYALURON 40-17 MG/ML IO SOLN
INTRAOCULAR | Status: DC | PRN
  Administered 2016-12-07: 1 mL via INTRAOCULAR

## 2016-12-07 MED ORDER — MIDAZOLAM HCL 2 MG/2ML IJ SOLN
INTRAMUSCULAR | Status: AC
  Filled 2016-12-07: qty 2

## 2016-12-07 MED ORDER — LIDOCAINE HCL (PF) 4 % IJ SOLN
INTRAOCULAR | Status: DC | PRN
  Administered 2016-12-07: 4 mL via OPHTHALMIC

## 2016-12-07 SURGICAL SUPPLY — 21 items
CANNULA ANT/CHMB 27GA (MISCELLANEOUS) ×2 IMPLANT
CUP MEDICINE 2OZ PLAST GRAD ST (MISCELLANEOUS) ×2 IMPLANT
GLOVE BIO SURGEON STRL SZ8 (GLOVE) ×2 IMPLANT
GLOVE BIOGEL M 6.5 STRL (GLOVE) ×2 IMPLANT
GLOVE SURG LX 8.0 MICRO (GLOVE) ×1
GLOVE SURG LX STRL 8.0 MICRO (GLOVE) ×1 IMPLANT
GOWN STRL REUS W/ TWL LRG LVL3 (GOWN DISPOSABLE) ×2 IMPLANT
GOWN STRL REUS W/TWL LRG LVL3 (GOWN DISPOSABLE) ×2
LENS IOL TECNIS ITEC 22.5 (Intraocular Lens) ×2 IMPLANT
PACK CATARACT (MISCELLANEOUS) ×2 IMPLANT
PACK CATARACT BRASINGTON LX (MISCELLANEOUS) ×2 IMPLANT
PACK EYE AFTER SURG (MISCELLANEOUS) ×2 IMPLANT
SOL BSS BAG (MISCELLANEOUS) ×2
SOL PREP PVP 2OZ (MISCELLANEOUS) ×2
SOLUTION BSS BAG (MISCELLANEOUS) ×1 IMPLANT
SOLUTION PREP PVP 2OZ (MISCELLANEOUS) ×1 IMPLANT
SYR 3ML LL SCALE MARK (SYRINGE) ×2 IMPLANT
SYR 5ML LL (SYRINGE) ×2 IMPLANT
SYR TB 1ML 27GX1/2 LL (SYRINGE) ×2 IMPLANT
WATER STERILE IRR 250ML POUR (IV SOLUTION) ×2 IMPLANT
WIPE NON LINTING 3.25X3.25 (MISCELLANEOUS) ×2 IMPLANT

## 2016-12-07 NOTE — Op Note (Signed)
PREOPERATIVE DIAGNOSIS:  Nuclear sclerotic cataract of the right eye.   POSTOPERATIVE DIAGNOSIS:  NUCLEAR SCLEROTIC CATARACT RIGHT EYE   OPERATIVE PROCEDURE: Procedure(s): CATARACT EXTRACTION PHACO AND INTRAOCULAR LENS PLACEMENT (IOC)   SURGEON:  Birder Robson, MD.   ANESTHESIA:  Anesthesiologist: Gunnar Fusi, MD CRNA: Hedda Slade, CRNA  1.      Managed anesthesia care. 2.      0.27ml of Shugarcaine was instilled in the eye following the paracentesis.   COMPLICATIONS:  None.   TECHNIQUE:   Stop and chop   DESCRIPTION OF PROCEDURE:  The patient was examined and consented in the preoperative holding area where the aforementioned topical anesthesia was applied to the right eye and then brought back to the Operating Room where the right eye was prepped and draped in the usual sterile ophthalmic fashion and a lid speculum was placed. A paracentesis was created with the side port blade and the anterior chamber was filled with viscoelastic. A near clear corneal incision was performed with the steel keratome. A continuous curvilinear capsulorrhexis was performed with a cystotome followed by the capsulorrhexis forceps. Hydrodissection and hydrodelineation were carried out with BSS on a blunt cannula. The lens was removed in a stop and chop  technique and the remaining cortical material was removed with the irrigation-aspiration handpiece. The capsular bag was inflated with viscoelastic and the Technis [id number]  lens was placed in the capsular bag without complication. The remaining viscoelastic was removed from the eye with the irrigation-aspiration handpiece. The wounds were hydrated. The anterior chamber was flushed with Miostat and the eye was inflated to physiologic pressure. 0.61ml of Vigamox was placed in the anterior chamber. The wounds were found to be water tight. The eye was dressed with Vigamox. The patient was given protective glasses to wear throughout the day and a shield with which to  sleep tonight. The patient was also given drops with which to begin a drop regimen today and will follow-up with me in one day.  Implant Name Type Inv. Item Serial No. Manufacturer Lot No. LRB No. Used  LENS IOL DIOP 22.5 - [id number] Intraocular Lens LENS IOL DIOP 22.5 [id number]   Right 1   Procedure(s) with comments: CATARACT EXTRACTION PHACO AND INTRAOCULAR LENS PLACEMENT (IOC) (Right) - [place] 42.5 AP% 23.2 CDE 9.87 Fluid pack lot # [id number]  Electronically signed: [place] 12/07/2016 9:53 AM

## 2016-12-07 NOTE — Discharge Instructions (Signed)
Eye Surgery Discharge Instructions  Expect mild scratchy sensation or mild soreness. DO NOT RUB YOUR EYE!  The day of surgery:  Minimal physical activity, but bed rest is not required  No reading, computer work, or close hand work  No bending, lifting, or straining.  May watch TV  For 24 hours:  No driving, legal decisions, or alcoholic beverages  Safety precautions  Eat anything you prefer: It is better to start with liquids, then soup then solid foods.  _____ Eye patch should be worn until postoperative exam tomorrow.  ____ Solar shield eyeglasses should be worn for comfort in the sunlight/patch while sleeping  Resume all regular medications including aspirin or Coumadin if these were discontinued prior to surgery. You may shower, bathe, shave, or wash your hair. Tylenol may be taken for mild discomfort.  Call your doctor if you experience significant pain, nausea, or vomiting, fever > 101 or other signs of infection. [phone] or [phone] Specific instructions:  Follow-up Information    PORFILIO,WILLIAM LOUIS, MD Follow up on 12/08/2016.   Specialty:  Ophthalmology Why:  10:40 Contact information: [address] [place] [address] [phone]

## 2016-12-07 NOTE — Anesthesia Postprocedure Evaluation (Signed)
Anesthesia Post Note  Patient: Morgan Dalton  Procedure(s) Performed: Procedure(s) (LRB): CATARACT EXTRACTION PHACO AND INTRAOCULAR LENS PLACEMENT (IOC) (Right)  Patient location during evaluation: PACU Anesthesia Type: MAC Level of consciousness: awake, awake and alert and oriented Pain management: pain level controlled Vital Signs Assessment: post-procedure vital signs reviewed and stable Respiratory status: spontaneous breathing Cardiovascular status: blood pressure returned to baseline Postop Assessment: no signs of nausea or vomiting Anesthetic complications: no     Last Vitals:  Vitals:   12/07/16 0832 12/07/16 0955  BP: (!) 156/85 (!) 159/94  Pulse: 67 62  Resp: 17 12  Temp: 36.7 C 36.1 C    Last Pain:  Vitals:   12/07/16 0955  TempSrc: Temporal                 Cha Gomillion Lorenza Chick

## 2016-12-07 NOTE — Anesthesia Preprocedure Evaluation (Signed)
Anesthesia Evaluation  Patient identified by MRN, date of birth, ID band Patient awake    Reviewed: Allergy & Precautions, NPO status , Patient's Chart, lab work & pertinent test results  History of Anesthesia Complications Negative for: history of anesthetic complications  Airway Mallampati: III       Dental   Pulmonary neg pulmonary ROS,           Cardiovascular hypertension, Pt. on medications (-) Past MI and (-) CHF (-) dysrhythmias (-) Valvular Problems/Murmurs     Neuro/Psych Anxiety negative neurological ROS     GI/Hepatic Neg liver ROS, GERD  ,  Endo/Other  Hypothyroidism   Renal/GU Renal disease (stones)     Musculoskeletal   Abdominal   Peds  Hematology negative hematology ROS (+)   Anesthesia Other Findings   Reproductive/Obstetrics                             Anesthesia Physical Anesthesia Plan  ASA: II  Anesthesia Plan: MAC   Post-op Pain Management:    Induction: Intravenous  Airway Management Planned:   Additional Equipment:   Intra-op Plan:   Post-operative Plan:   Informed Consent: I have reviewed the patients History and Physical, chart, labs and discussed the procedure including the risks, benefits and alternatives for the proposed anesthesia with the patient or authorized representative who has indicated his/her understanding and acceptance.     Plan Discussed with:   Anesthesia Plan Comments:         Anesthesia Quick Evaluation

## 2016-12-07 NOTE — Transfer of Care (Signed)
Immediate Anesthesia Transfer of Care Note  Patient: Morgan Dalton  Procedure(s) Performed: Procedure(s) with comments: CATARACT EXTRACTION PHACO AND INTRAOCULAR LENS PLACEMENT (IOC) (Right) - [place] 42.5 AP% 23.2 CDE 9.87 Fluid pack lot # [id number]  Patient Location: PACU  Anesthesia Type:MAC  Level of Consciousness: awake, alert  and oriented  Airway & Oxygen Therapy: Patient Spontanous Breathing  Post-op Assessment: Report given to RN and Post -op Vital signs reviewed and stable  Post vital signs: Reviewed and stable  Last Vitals:  Vitals:   12/07/16 0832 12/07/16 0955  BP: (!) 156/85 (!) 159/94  Pulse: 67 62  Resp: 17 12  Temp: 36.7 C 36.1 C    Last Pain:  Vitals:   12/07/16 0955  TempSrc: Temporal         Complications: No apparent anesthesia complications

## 2016-12-07 NOTE — Anesthesia Procedure Notes (Signed)
Procedure Name: MAC Date/Time: 12/07/2016 9:30 AM Performed by: Hedda Slade Pre-anesthesia Checklist: Patient identified, Emergency Drugs available, Suction available and Patient being monitored Oxygen Delivery Method: Nasal cannula

## 2016-12-07 NOTE — H&P (Signed)
All labs reviewed. Abnormal studies sent to patients PCP when indicated.  Previous H&P reviewed, patient examined, there are NO CHANGES.  Morgan Dalton LOUIS12/26/20179:24 AM

## 2016-12-09 IMAGING — DX DG CHEST 2V
2 series · 2 of 2 positions shown · non-contrast
Comparison: 12/18/2013.

CLINICAL DATA: Left upper chest and shoulder pain for 1 week. No
cough or shortness of breath.

EXAM:
CHEST  2 VIEW

[chest pa]
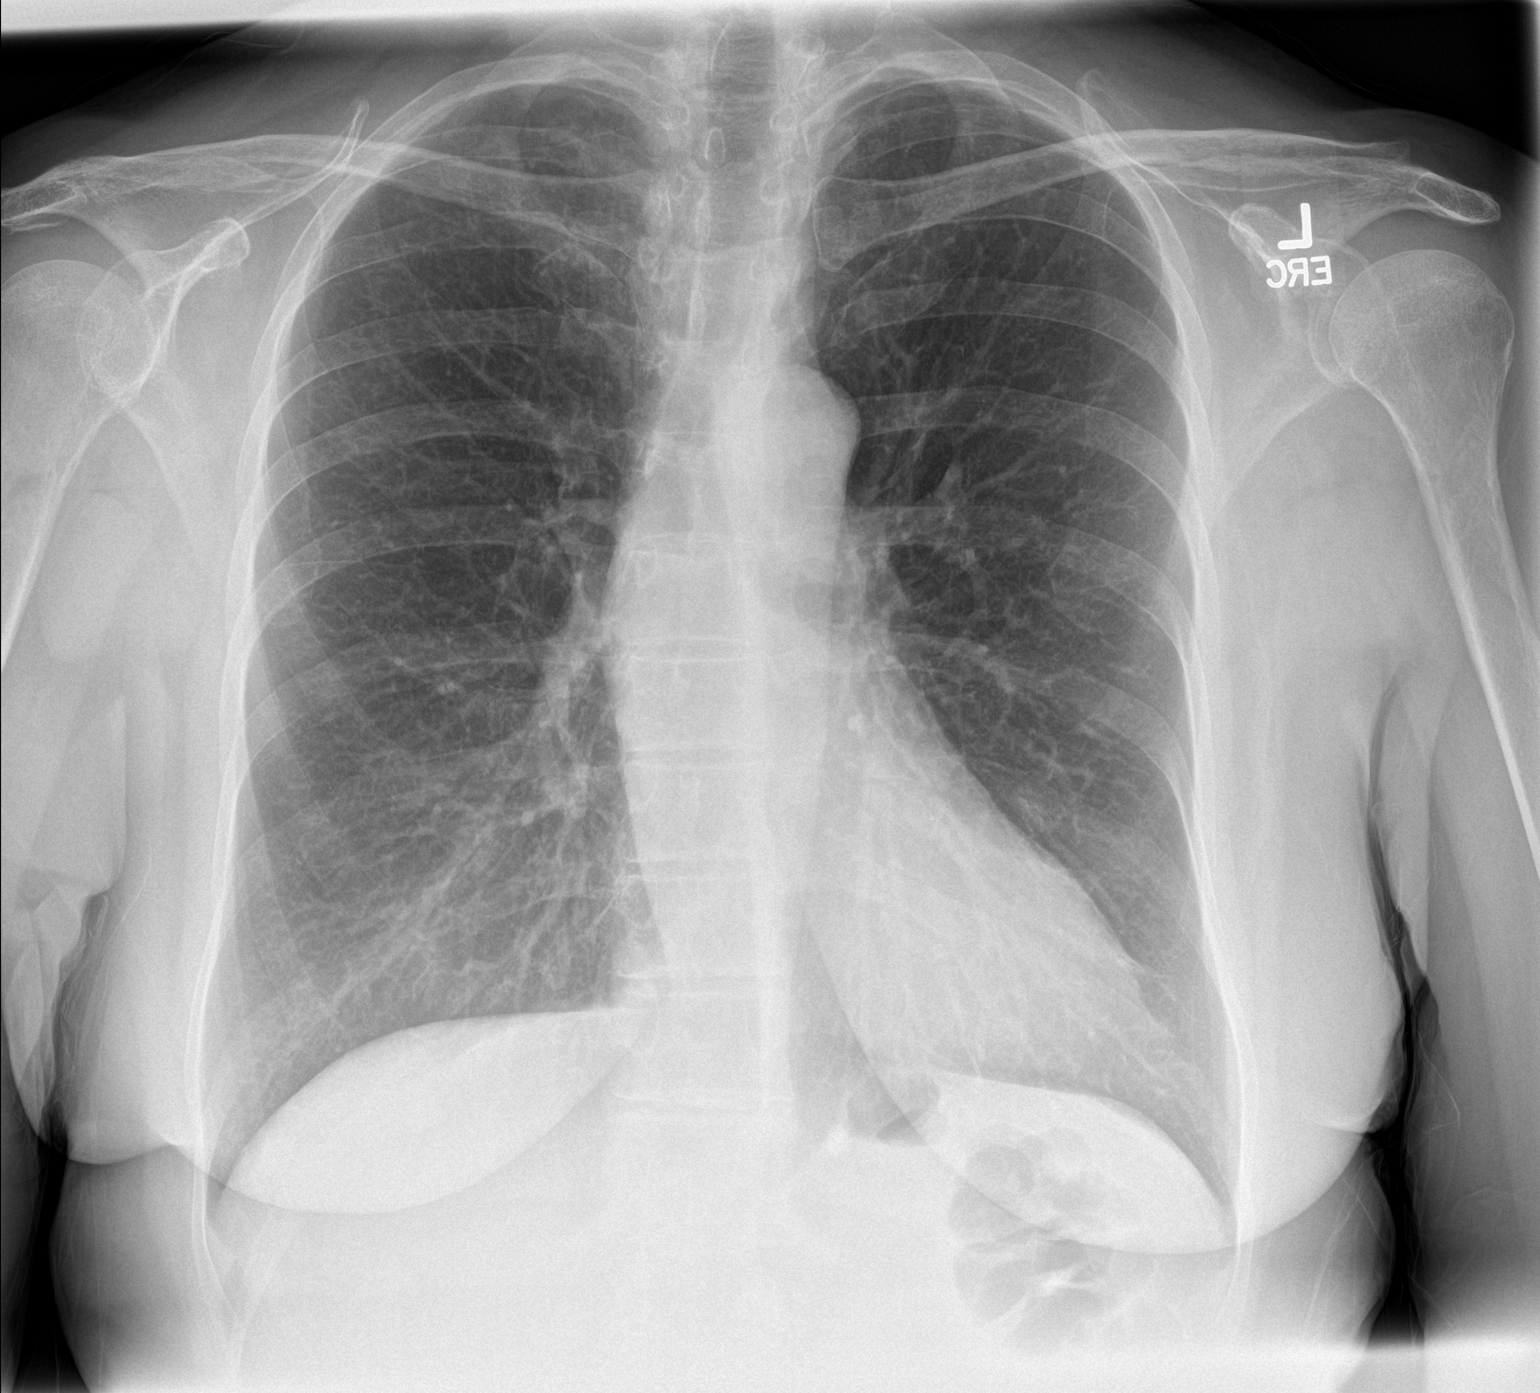

[chest lat]
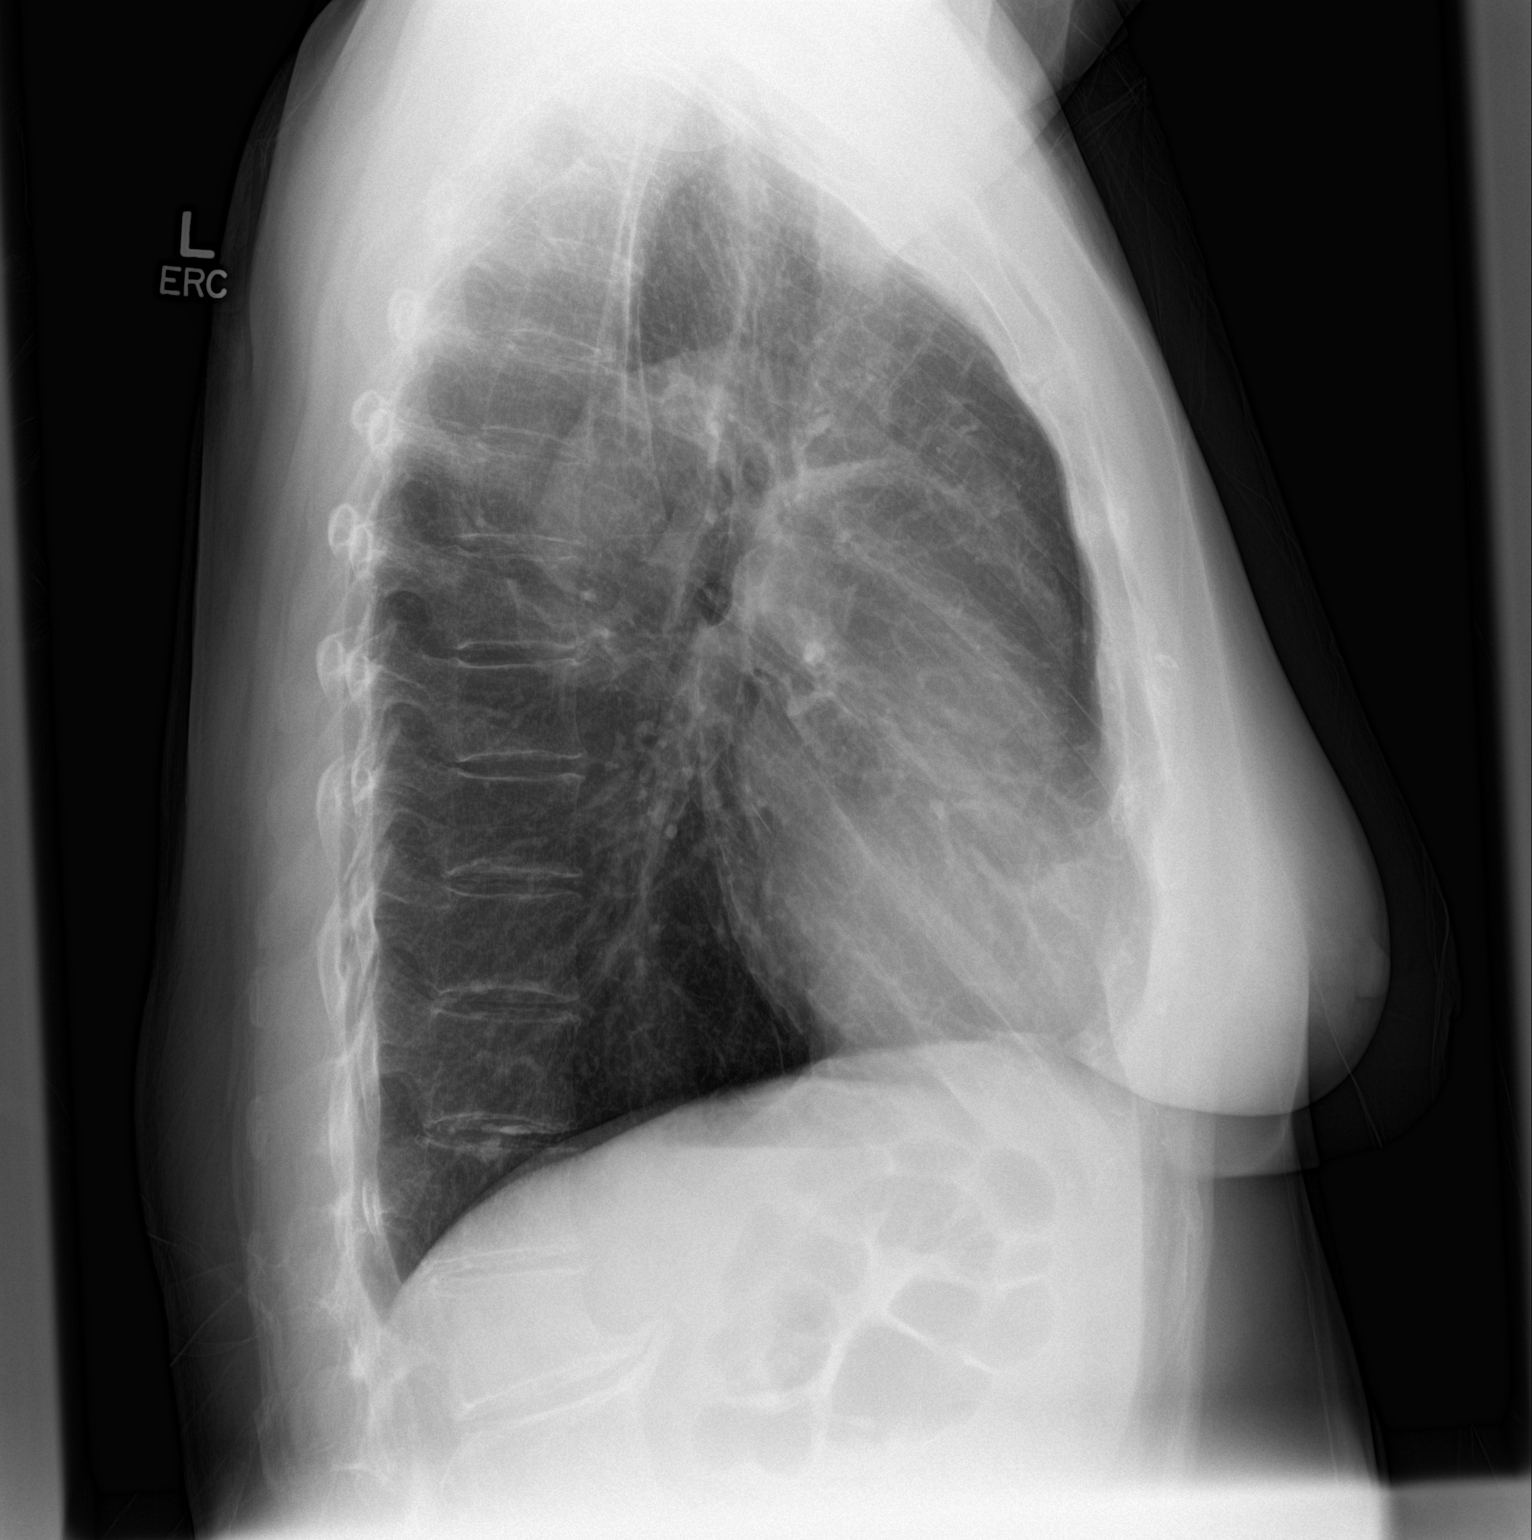

[2 of 2 positions shown; findings below may reference images not displayed]

FINDINGS: Cardiac silhouette is normal in size. Normal mediastinal and hilar
contours.

Lungs are mildly hyperexpanded but clear. No pleural effusion or
pneumothorax.

Bony thorax is demineralized but intact. No significant abnormality
noted of the left shoulder.
IMPRESSION: No active cardiopulmonary disease.

## 2016-12-22 ENCOUNTER — Other Ambulatory Visit: Payer: Self-pay | Admitting: Internal Medicine

## 2016-12-22 DIAGNOSIS — Z1231 Encounter for screening mammogram for malignant neoplasm of breast: Secondary | ICD-10-CM

## 2016-12-28 ENCOUNTER — Other Ambulatory Visit: Payer: Self-pay

## 2016-12-29 ENCOUNTER — Encounter: Payer: Self-pay | Admitting: *Deleted

## 2016-12-30 ENCOUNTER — Ambulatory Visit: Payer: Medicare Other

## 2016-12-31 ENCOUNTER — Encounter: Payer: Self-pay | Admitting: Urology

## 2016-12-31 ENCOUNTER — Other Ambulatory Visit
Admission: RE | Admit: 2016-12-31 | Discharge: 2016-12-31 | Disposition: A | Discharge status: Home / Self Care | Payer: Medicare Other | Source: Ambulatory Visit | Attending: Urology | Admitting: Urology

## 2016-12-31 ENCOUNTER — Ambulatory Visit (INDEPENDENT_AMBULATORY_CARE_PROVIDER_SITE_OTHER): Payer: Medicare Other | Admitting: Urology

## 2016-12-31 DIAGNOSIS — G4700 Insomnia, unspecified: Secondary | ICD-10-CM | POA: Insufficient documentation

## 2016-12-31 DIAGNOSIS — N200 Calculus of kidney: Secondary | ICD-10-CM

## 2016-12-31 DIAGNOSIS — N329 Bladder disorder, unspecified: Secondary | ICD-10-CM | POA: Diagnosis not present

## 2016-12-31 DIAGNOSIS — M8580 Other specified disorders of bone density and structure, unspecified site: Secondary | ICD-10-CM | POA: Insufficient documentation

## 2016-12-31 DIAGNOSIS — E785 Hyperlipidemia, unspecified: Secondary | ICD-10-CM | POA: Insufficient documentation

## 2016-12-31 DIAGNOSIS — R3129 Other microscopic hematuria: Secondary | ICD-10-CM | POA: Insufficient documentation

## 2016-12-31 DIAGNOSIS — K219 Gastro-esophageal reflux disease without esophagitis: Secondary | ICD-10-CM | POA: Insufficient documentation

## 2016-12-31 DIAGNOSIS — R102 Pelvic and perineal pain: Secondary | ICD-10-CM | POA: Diagnosis not present

## 2016-12-31 DIAGNOSIS — Z87442 Personal history of urinary calculi: Secondary | ICD-10-CM | POA: Insufficient documentation

## 2016-12-31 LAB — URINALYSIS, COMPLETE (UACMP) WITH MICROSCOPIC
BILIRUBIN URINE: NEGATIVE
GLUCOSE, UA: NEGATIVE mg/dL
KETONES UR: NEGATIVE mg/dL
NITRITE: NEGATIVE
PH: 6 (ref 5.0–8.0)
Protein, ur: NEGATIVE mg/dL

## 2016-12-31 MED ORDER — LIDOCAINE HCL 2 % EX GEL
1.0000 "application " | Freq: Once | CUTANEOUS | Status: AC
  Administered 2016-12-31: 1 via URETHRAL

## 2016-12-31 MED ORDER — CIPROFLOXACIN HCL 500 MG PO TABS
500.0000 mg | ORAL_TABLET | Freq: Once | ORAL | Status: AC
  Administered 2016-12-31: 500 mg via ORAL

## 2016-12-31 NOTE — Progress Notes (Signed)
12/31/2016 4:05 PM   Morgan Dalton 12-29-1950 PI:[id number]  Referring provider: Adin Hector, MD [facility] [place], [place] [address]  Chief Complaint  Patient presents with  . Nephrolithiasis    New Patient -transfer care from [facility]    HPI: 66 year old female with multiple GU issues transferring care from [facility], Dr. Jacqlyn Larsen to [place].  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  S/p PCNL '82, L basket extraction '93, L ESWL '95, passed 2 additional stones spontaneously '15 and '16.  Right nonobstructing partial staghorn involving right lower pole, stable x many years. Measures partly 3.1 x 1.1. Follow present by Dr. Jacqlyn Larsen, observation.  Rare UTIs, last infection Escherichia coli on 05/2016, resistant to Bactrim.   No flank pain or gross hematuria.    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   No voiding symptoms.    3- Bladder lesion- Status post cystoscopy by Dr. Jacqlyn Larsen 9/17 with a few suspicious areas, including a 3 mm area of raised tissue on the bladder base just the right of the midline, and 2 central raised lesions with hypervascularity. Urine cytology was negative.  Recommended follow cystoscopy in 3 months.  4- Pelvic pain-  Nonspecific pelvic pain for several months following UTI. Workup for this with Dr. Avelina Laine including CT abd/ pelvis with contrast 8/17.  Personal history of endometrial cancer s/p robotic hysterectomy with BSO 12/2007 with radiation. No evidence of recurrence. Pelvic pain is coming slightly resolved since that time.   Today, Morgan Dalton is seen to establish care.    PMH: Past Medical History:  Diagnosis Date  . Anxiety   . Cancer [facility])    Uterine Cancer  . GERD (gastroesophageal reflux disease)   . History of kidney stones   . Hypercholesterolemia   . Hypertension   .  Hypothyroidism   . Kidney stone   . Osteopenia     Surgical History: Past Surgical History:  Procedure Laterality Date  . ABDOMINAL HYSTERECTOMY    . CATARACT EXTRACTION W/PHACO Right 12/07/2016   Procedure: CATARACT EXTRACTION PHACO AND INTRAOCULAR LENS PLACEMENT (IOC);  Surgeon: Birder Robson, MD;  Location: [facility] ORS;  Service: Ophthalmology;  Laterality: Right;  [place] 42.5 AP% 23.2 CDE 9.87 Fluid pack lot # [id number]  . CHOLECYSTECTOMY    . DILATION AND CURETTAGE OF UTERUS    . KIDNEY STONE SURGERY    . KNEE ARTHROSCOPY      Home Medications:  Allergies as of 12/31/2016      Reactions   Statins    Myalgia       Medication List       Accurate as of 12/31/16 11:59 PM. Always use your most recent med list.          aspirin EC 81 MG tablet Take 81 mg by mouth daily.   Co Q-10 100 MG Caps Take 100 mg by mouth daily.   ezetimibe 10 MG tablet Commonly known as:  ZETIA Take 10 mg by mouth daily.   Glucosamine-Chondroitin 750-600 MG Tabs Take 1 tablet by mouth daily.   hydrochlorothiazide 25 MG tablet Commonly known as:  HYDRODIURIL Take 12.5 mg by mouth daily.   levothyroxine 25 MCG tablet Commonly known as:  SYNTHROID, LEVOTHROID Take 25 mcg by mouth daily before breakfast.   Multiple Vitamins tablet Take 1 tablet by mouth daily.   OMEGA 3 500 500 MG Caps Take  500 mg by mouth daily.   pantoprazole 40 MG tablet Commonly known as:  PROTONIX Take 40 mg by mouth daily.   sertraline 50 MG tablet Commonly known as:  ZOLOFT Take 50 mg by mouth daily.       Allergies:  Allergies  Allergen Reactions  . Statins     Myalgia     Family History: Family History  Problem Relation Age of Onset  . Breast cancer Paternal Aunt     Social History:  reports that she has never smoked. She has never used smokeless tobacco. She reports that she does not drink alcohol or use drugs.  ROS: UROLOGY Frequent Urination?: No Hard to postpone urination?:  No Burning/pain with urination?: No Get up at night to urinate?: No Leakage of urine?: No Urine stream starts and stops?: No Trouble starting stream?: No Do you have to strain to urinate?: No Blood in urine?: No Urinary tract infection?: No Sexually transmitted disease?: No Injury to kidneys or bladder?: No Painful intercourse?: No Weak stream?: No Currently pregnant?: No Vaginal bleeding?: No Last menstrual period?: n  Gastrointestinal Nausea?: No Vomiting?: No Indigestion/heartburn?: No Diarrhea?: No Constipation?: No  Constitutional Fever: No Night sweats?: Yes Weight loss?: No Fatigue?: No  Skin Skin rash/lesions?: No Itching?: No  Eyes Blurred vision?: Yes  Ears/Nose/Throat Sore throat?: No Sinus problems?: No  Hematologic/Lymphatic Swollen glands?: No Easy bruising?: No  Cardiovascular Leg swelling?: No Chest pain?: No  Respiratory Cough?: No Shortness of breath?: No  Endocrine Excessive thirst?: No  Musculoskeletal Back pain?: No Joint pain?: No  Neurological Headaches?: No Dizziness?: No  Psychologic Depression?: No Anxiety?: No  Physical Exam: BP (!) 159/83   Pulse 76   Ht 5\' 4"  (1.626 m)   Wt 205 lb (93 kg)   BMI 35.19 kg/m   Constitutional:  Alert and oriented, No acute distress. HEENT: [place] AT, moist mucus membranes.  Trachea midline, no masses. Cardiovascular: No clubbing, cyanosis.  Trace LE edema.  Marland Kitchen Respiratory: Normal respiratory effort, no increased work of breathing. GI: Abdomen is soft, nontender, nondistended, no abdominal masses. Obese.  GU: No CVA tenderness.  Skin: No rashes, bruises or suspicious lesions. Lymph: No cervical adenopathy.  Neurologic: Grossly intact, no focal deficits, moving all 4 extremities. Psychiatric: Normal mood and affect.  Laboratory Data: Labs 12/2015 Cr 0.9 Hgb 12.3   Urinalysis Component     Latest Ref Rng & Units 12/31/2016  Color, Urine     YELLOW YELLOW  Appearance      CLEAR CLEAR  Glucose     NEGATIVE mg/dL NEGATIVE  Bilirubin Urine     NEGATIVE NEGATIVE  Ketones, ur     NEGATIVE mg/dL NEGATIVE  Specific Gravity, Urine     1.005 - 1.030 <1.005 (L)  Hgb urine dipstick     NEGATIVE MODERATE (A)  pH     5.0 - 8.0 6.0  Protein     NEGATIVE mg/dL NEGATIVE  Nitrite     NEGATIVE NEGATIVE  Leukocytes, UA     NEGATIVE SMALL (A)  RBC / HPF     0 - 5 RBC/hpf 6-30  WBC, UA     0 - 5 WBC/hpf 6-30  Bacteria, UA     NONE SEEN FEW (A)  Squamous Epithelial / LPF     NONE SEEN 0-5 (A)  Mucous      PRESENT   Pertinent Imaging: CLINICAL DATA:  Worsening pelvic pain for 3 months. Recent urinary tract infection. Previous hysterectomy and  radiation therapy for uterine carcinoma.  EXAM: CT ABDOMEN AND PELVIS WITH CONTRAST  TECHNIQUE: Multidetector CT imaging of the abdomen and pelvis was performed using the standard protocol following bolus administration of intravenous contrast.  CONTRAST:  116mL ISOVUE-300 IOPAMIDOL (ISOVUE-300) INJECTION 61%  COMPARISON:  06/15/2011 and 05/29/2010  FINDINGS: Lower chest: No acute findings. Calcified granuloma in left lower lobe.  Hepatobiliary: Mild hepatic steatosis. No liver masses identified. Prior cholecystectomy noted. No evidence of biliary dilatation.  Pancreas: No mass, inflammatory changes, or other significant abnormality.  Spleen: Within normal limits in size and appearance.  Adrenals/Urinary Tract: No adrenal masses. Mild left renal parenchymal atrophy and scarring is stable. No renal masses identified. Partial staghorn calculus again seen within the lower pole collecting system of the right kidney. No evidence of ureteral calculi or hydronephrosis. Unopacified urinary bladder is unremarkable in appearance.  Stomach/Bowel: No evidence of obstruction, inflammatory process, or abnormal fluid collections.  Vascular/Lymphatic: No pathologically enlarged lymph nodes.  Sub-cm retroperitoneal lymph nodes at the level of the aortic bifurcation are stable since 2011.  Aortic atherosclerosis.  No evidence of abdominal aortic aneurysm.  Reproductive: Prior hysterectomy noted. Adnexal regions are unremarkable in appearance.  Other: None.  Musculoskeletal:  No suspicious bone lesions identified.  IMPRESSION: No acute findings within the abdomen or pelvis.  Partial staghorn calculus in right lower pole intrarenal collecting system. No evidence of ureteral calculi or hydronephrosis.  Mild hepatic steatosis.  Aortic atherosclerosis.   Electronically Signed   By: Earle Gell M.D.   On: 08/12/2016 12:44  CT scan personally reviewed today, this is compared to previous scans.  Assessment & Plan:    1. Staghorn kidney stones Stable RLQ staghorn, 3.1 x 1.1 cm Asymptomatic with only single UTI, 05/2016 Discussed PCNL vs. Continued observation and risk/ benefits of both Interested in continued observation Reviewed stone prevention techniques F/u in 6 months with KUB Return sooner as needed-  For flank pain, gross hematuria, UTI  2. Microscopic hematuria Chronic, likely related to stones Cystoscopy recommended  3. Lesion of bladder Recommend cystoscopy for follow up of suspicious bladder lesion  4. Pelvic pain in female Resolved    Return in about 6 months (around 06/30/2017) for with KUB.  Hollice Espy, MD  [facility] [address], [place],  [address] [phone]

## 2017-01-01 NOTE — Progress Notes (Signed)
   12/31/16  CC:  Chief Complaint  Patient presents with  . Nephrolithiasis    New Patient -transfer care from [facility]    HPI:  See note from 12/31/16  Blood pressure (!) 159/83, pulse 76, height 5\' 4"  (1.626 m), weight 205 lb (93 kg). NED. A&Ox3.   No respiratory distress   Abd soft, NT, ND Normal external genitalia with patent urethral meatus  Cystoscopy Procedure Note  Patient identification was confirmed, informed consent was obtained, and patient was prepped using Betadine solution.  Lidocaine jelly was administered per urethral meatus.    Preoperative abx where received prior to procedure.    Procedure: - Flexible cystoscope introduced, without any difficulty.   - Thorough search of the bladder revealed:    normal urethral meatus    normal urothelium, no suspicious lesion    no stones    no ulcers     no tumors    no urethral polyps    no trabeculation  - Ureteral orifices were normal in position and appearance.  Post-Procedure: - Patient tolerated the procedure well   Hollice Espy, MD

## 2017-01-04 ENCOUNTER — Ambulatory Visit: Payer: Medicare Other | Admitting: Anesthesiology

## 2017-01-04 ENCOUNTER — Ambulatory Visit
Admission: RE | Admit: 2017-01-04 | Discharge: 2017-01-04 | Disposition: A | Discharge status: Home / Self Care | Payer: Medicare Other | Source: Ambulatory Visit | Attending: Ophthalmology | Admitting: Ophthalmology

## 2017-01-04 ENCOUNTER — Encounter
Admission: RE | Disposition: A | Discharge status: Home / Self Care | Payer: Self-pay | Source: Ambulatory Visit | Attending: Ophthalmology

## 2017-01-04 DIAGNOSIS — K219 Gastro-esophageal reflux disease without esophagitis: Secondary | ICD-10-CM | POA: Insufficient documentation

## 2017-01-04 DIAGNOSIS — Z79899 Other long term (current) drug therapy: Secondary | ICD-10-CM | POA: Diagnosis not present

## 2017-01-04 DIAGNOSIS — E039 Hypothyroidism, unspecified: Secondary | ICD-10-CM | POA: Diagnosis not present

## 2017-01-04 DIAGNOSIS — H2512 Age-related nuclear cataract, left eye: Secondary | ICD-10-CM | POA: Insufficient documentation

## 2017-01-04 DIAGNOSIS — I10 Essential (primary) hypertension: Secondary | ICD-10-CM | POA: Insufficient documentation

## 2017-01-04 HISTORY — PX: CATARACT EXTRACTION W/PHACO: SHX586

## 2017-01-04 SURGERY — PHACOEMULSIFICATION, CATARACT, WITH IOL INSERTION
Anesthesia: Monitor Anesthesia Care | Site: Eye | Laterality: Left | Wound class: Clean

## 2017-01-04 MED ORDER — MIDAZOLAM HCL 2 MG/2ML IJ SOLN
INTRAMUSCULAR | Status: DC | PRN
  Administered 2017-01-04: 2 mg via INTRAVENOUS

## 2017-01-04 MED ORDER — POVIDONE-IODINE 5 % OP SOLN
OPHTHALMIC | Status: AC
  Filled 2017-01-04: qty 30

## 2017-01-04 MED ORDER — MOXIFLOXACIN HCL 0.5 % OP SOLN
1.0000 [drp] | OPHTHALMIC | Status: AC
  Administered 2017-01-04 (×3): 1 [drp] via OPHTHALMIC

## 2017-01-04 MED ORDER — SODIUM CHLORIDE 0.9 % IV SOLN
INTRAVENOUS | Status: DC
  Administered 2017-01-04: 08:00:00 via INTRAVENOUS

## 2017-01-04 MED ORDER — MOXIFLOXACIN HCL 0.5 % OP SOLN
OPHTHALMIC | Status: DC | PRN
  Administered 2017-01-04: 1 [drp] via OPHTHALMIC

## 2017-01-04 MED ORDER — NA CHONDROIT SULF-NA HYALURON 40-17 MG/ML IO SOLN
INTRAOCULAR | Status: DC | PRN
  Administered 2017-01-04: 1 mL via INTRAOCULAR

## 2017-01-04 MED ORDER — LIDOCAINE HCL (PF) 4 % IJ SOLN
INTRAOCULAR | Status: DC | PRN
  Administered 2017-01-04: 4 mL via OPHTHALMIC

## 2017-01-04 MED ORDER — NA CHONDROIT SULF-NA HYALURON 40-17 MG/ML IO SOLN
INTRAOCULAR | Status: AC
  Filled 2017-01-04: qty 1

## 2017-01-04 MED ORDER — ARMC OPHTHALMIC DILATING DROPS
1.0000 "application " | OPHTHALMIC | Status: AC
  Administered 2017-01-04 (×3): 1 via OPHTHALMIC

## 2017-01-04 MED ORDER — MIDAZOLAM HCL 5 MG/5ML IJ SOLN
INTRAMUSCULAR | Status: AC
  Filled 2017-01-04: qty 5

## 2017-01-04 MED ORDER — POVIDONE-IODINE 5 % OP SOLN
OPHTHALMIC | Status: DC | PRN
  Administered 2017-01-04: 1 via OPHTHALMIC

## 2017-01-04 MED ORDER — CARBACHOL 0.01 % IO SOLN
INTRAOCULAR | Status: DC | PRN
  Administered 2017-01-04: 0.5 mL via INTRAOCULAR

## 2017-01-04 MED ORDER — LIDOCAINE HCL (PF) 4 % IJ SOLN
INTRAMUSCULAR | Status: AC
  Filled 2017-01-04: qty 5

## 2017-01-04 MED ORDER — EPINEPHRINE PF 1 MG/ML IJ SOLN
INTRAMUSCULAR | Status: AC
  Filled 2017-01-04: qty 2

## 2017-01-04 MED ORDER — EPINEPHRINE PF 1 MG/ML IJ SOLN
INTRAMUSCULAR | Status: DC | PRN
  Administered 2017-01-04: 09:00:00 via OPHTHALMIC

## 2017-01-04 SURGICAL SUPPLY — 21 items
CANNULA ANT/CHMB 27GA (MISCELLANEOUS) ×2 IMPLANT
CUP MEDICINE 2OZ PLAST GRAD ST (MISCELLANEOUS) ×2 IMPLANT
GLOVE BIO SURGEON STRL SZ8 (GLOVE) ×2 IMPLANT
GLOVE BIOGEL M 6.5 STRL (GLOVE) ×2 IMPLANT
GLOVE SURG LX 8.0 MICRO (GLOVE) ×1
GLOVE SURG LX STRL 8.0 MICRO (GLOVE) ×1 IMPLANT
GOWN STRL REUS W/ TWL LRG LVL3 (GOWN DISPOSABLE) ×2 IMPLANT
GOWN STRL REUS W/TWL LRG LVL3 (GOWN DISPOSABLE) ×2
LENS IOL TECNIS ITEC 22.0 (Intraocular Lens) ×2 IMPLANT
PACK CATARACT (MISCELLANEOUS) ×2 IMPLANT
PACK CATARACT BRASINGTON LX (MISCELLANEOUS) ×2 IMPLANT
PACK EYE AFTER SURG (MISCELLANEOUS) ×2 IMPLANT
SOL BSS BAG (MISCELLANEOUS) ×2
SOL PREP PVP 2OZ (MISCELLANEOUS) ×2
SOLUTION BSS BAG (MISCELLANEOUS) ×1 IMPLANT
SOLUTION PREP PVP 2OZ (MISCELLANEOUS) ×1 IMPLANT
SYR 3ML LL SCALE MARK (SYRINGE) ×2 IMPLANT
SYR 5ML LL (SYRINGE) ×2 IMPLANT
SYR TB 1ML 27GX1/2 LL (SYRINGE) ×2 IMPLANT
WATER STERILE IRR 250ML POUR (IV SOLUTION) ×2 IMPLANT
WIPE NON LINTING 3.25X3.25 (MISCELLANEOUS) ×2 IMPLANT

## 2017-01-04 NOTE — Anesthesia Postprocedure Evaluation (Signed)
Anesthesia Post Note  Patient: ASHYA NICOLAISEN  Procedure(s) Performed: Procedure(s) (LRB): CATARACT EXTRACTION PHACO AND INTRAOCULAR LENS PLACEMENT (IOC) (Left)  Patient location during evaluation: PACU Anesthesia Type: MAC Level of consciousness: awake and alert Pain management: pain level controlled Vital Signs Assessment: post-procedure vital signs reviewed and stable Respiratory status: spontaneous breathing, nonlabored ventilation, respiratory function stable and patient connected to nasal cannula oxygen Cardiovascular status: stable and blood pressure returned to baseline Anesthetic complications: no     Last Vitals:  Vitals:   01/04/17 0816 01/04/17 0927  BP: (!) 153/83 (!) 158/75  Pulse: 76 62  Resp: 16 16  Temp: (!) 35.8 C 36.6 C    Last Pain:  Vitals:   01/04/17 0816  TempSrc: Tympanic                 Martha Clan

## 2017-01-04 NOTE — Transfer of Care (Signed)
Immediate Anesthesia Transfer of Care Note  Patient: Morgan Dalton  Procedure(s) Performed: Procedure(s) with comments: CATARACT EXTRACTION PHACO AND INTRAOCULAR LENS PLACEMENT (IOC) (Left) - [place] 39.2 AP% 23.9 CDE 9.38 Fluid Pack Lot # [id number]  Patient Location: PACU  Anesthesia Type:MAC  Level of Consciousness: awake  Airway & Oxygen Therapy: Patient Spontanous Breathing  Post-op Assessment: Report given to RN  Post vital signs: stable  Last Vitals:  Vitals:   01/04/17 0816 01/04/17 0927  BP: (!) 153/83   Pulse: 76   Resp: 16 16  Temp: (!) 35.8 C     Last Pain:  Vitals:   01/04/17 0816  TempSrc: Tympanic         Complications: No apparent anesthesia complications

## 2017-01-04 NOTE — Anesthesia Post-op Follow-up Note (Cosign Needed)
Anesthesia QCDR form completed.        

## 2017-01-04 NOTE — Addendum Note (Signed)
Addendum  created 01/04/17 1153 by Leander Rams, CRNA   Charge Capture section accepted

## 2017-01-04 NOTE — Discharge Instructions (Signed)

## 2017-01-04 NOTE — Anesthesia Preprocedure Evaluation (Signed)
Anesthesia Evaluation  Patient identified by MRN, date of birth, ID band Patient awake    Reviewed: Allergy & Precautions, NPO status , Patient's Chart, lab work & pertinent test results  History of Anesthesia Complications Negative for: history of anesthetic complications  Airway Mallampati: III       Dental   Pulmonary neg pulmonary ROS,           Cardiovascular hypertension, Pt. on medications (-) Past MI and (-) CHF (-) dysrhythmias (-) Valvular Problems/Murmurs     Neuro/Psych negative neurological ROS     GI/Hepatic Neg liver ROS, GERD  ,  Endo/Other  Hypothyroidism   Renal/GU Renal disease (stones)     Musculoskeletal   Abdominal   Peds  Hematology negative hematology ROS (+)   Anesthesia Other Findings   Reproductive/Obstetrics                             Anesthesia Physical  Anesthesia Plan  ASA: II  Anesthesia Plan: MAC   Post-op Pain Management:    Induction: Intravenous  Airway Management Planned:   Additional Equipment:   Intra-op Plan:   Post-operative Plan:   Informed Consent: I have reviewed the patients History and Physical, chart, labs and discussed the procedure including the risks, benefits and alternatives for the proposed anesthesia with the patient or authorized representative who has indicated his/her understanding and acceptance.     Plan Discussed with:   Anesthesia Plan Comments:         Anesthesia Quick Evaluation

## 2017-01-04 NOTE — Op Note (Signed)
PREOPERATIVE DIAGNOSIS:  Nuclear sclerotic cataract of the left eye.   POSTOPERATIVE DIAGNOSIS:  Nuclear sclerotic cataract of the left eye.   OPERATIVE PROCEDURE: Procedure(s): CATARACT EXTRACTION PHACO AND INTRAOCULAR LENS PLACEMENT (IOC)   SURGEON:  Birder Robson, MD.   ANESTHESIA:  Anesthesiologist: Martha Clan, MD CRNA: Rolla Plate, CRNA; Leander Rams, CRNA  1.      Managed anesthesia care. 2.     0.48ml of Shugarcaine was instilled following the paracentesis   COMPLICATIONS:  None.   TECHNIQUE:   Stop and chop   DESCRIPTION OF PROCEDURE:  The patient was examined and consented in the preoperative holding area where the aforementioned topical anesthesia was applied to the left eye and then brought back to the Operating Room where the left eye was prepped and draped in the usual sterile ophthalmic fashion and a lid speculum was placed. A paracentesis was created with the side port blade and the anterior chamber was filled with viscoelastic. A near clear corneal incision was performed with the steel keratome. A continuous curvilinear capsulorrhexis was performed with a cystotome followed by the capsulorrhexis forceps. Hydrodissection and hydrodelineation were carried out with BSS on a blunt cannula. The lens was removed in a stop and chop  technique and the remaining cortical material was removed with the irrigation-aspiration handpiece. The capsular bag was inflated with viscoelastic and the Technis [id number] lens was placed in the capsular bag without complication. The remaining viscoelastic was removed from the eye with the irrigation-aspiration handpiece. The wounds were hydrated. The anterior chamber was flushed with Miostat and the eye was inflated to physiologic pressure. 0.107ml Vigamox was placed in the anterior chamber. The wounds were found to be water tight. The eye was dressed with Vigamox. The patient was given protective glasses to wear throughout the day and a shield with  which to sleep tonight. The patient was also given drops with which to begin a drop regimen today and will follow-up with me in one day.  Implant Name Type Inv. Item Serial No. Manufacturer Lot No. LRB No. Used  LENS IOL DIOP 22.0 - [id number] Intraocular Lens LENS IOL DIOP 22.0 [id number]   Left 1    Procedure(s) with comments: CATARACT EXTRACTION PHACO AND INTRAOCULAR LENS PLACEMENT (IOC) (Left) - [place] 39.2 AP% 23.9 CDE 9.38 Fluid Pack Lot # [id number]  Electronically signed: [place] 01/04/2017 9:28 AM

## 2017-02-07 ENCOUNTER — Ambulatory Visit
Admission: RE | Admit: 2017-02-07 | Discharge: 2017-02-07 | Disposition: A | Discharge status: Home / Self Care | Payer: Medicare Other | Source: Ambulatory Visit | Attending: Internal Medicine | Admitting: Internal Medicine

## 2017-02-07 DIAGNOSIS — Z1231 Encounter for screening mammogram for malignant neoplasm of breast: Secondary | ICD-10-CM | POA: Diagnosis present

## 2017-06-30 ENCOUNTER — Ambulatory Visit
Admission: RE | Admit: 2017-06-30 | Discharge: 2017-06-30 | Disposition: A | Discharge status: Home / Self Care | Payer: Medicare Other | Source: Ambulatory Visit | Attending: Urology | Admitting: Urology

## 2017-06-30 DIAGNOSIS — N200 Calculus of kidney: Secondary | ICD-10-CM | POA: Diagnosis not present

## 2017-07-01 ENCOUNTER — Ambulatory Visit (INDEPENDENT_AMBULATORY_CARE_PROVIDER_SITE_OTHER): Payer: Medicare Other | Admitting: Urology

## 2017-07-01 ENCOUNTER — Encounter: Payer: Self-pay | Admitting: Urology

## 2017-07-01 VITALS — BP 132/79 | HR 71 | Ht 64.0 in | Wt 192.0 lb

## 2017-07-01 DIAGNOSIS — R3129 Other microscopic hematuria: Secondary | ICD-10-CM | POA: Diagnosis not present

## 2017-07-01 DIAGNOSIS — N200 Calculus of kidney: Secondary | ICD-10-CM

## 2017-07-01 NOTE — Progress Notes (Signed)
07/01/2017 11:45 AM   Morgan Dalton 1951/06/15 [id number]  Referring provider: Adin Hector, MD [facility] [place], [place] [address]  Chief Complaint  Patient presents with  . Nephrolithiasis    6 month    HPI: 66 year old female who returns for six-month follow-up.  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  S/p PCNL '82, L basket extraction '93, L ESWL '95, passed 2 additional stones spontaneously '15 and '16.  Right nonobstructing partial staghorn involving right lower pole, stable x many years. Measures partly 3.1 x 1.1 cm. Venously followed by Dr. Jacqlyn Larsen with observation.    Rare UTIs, last infection Escherichia coli on 05/2016, resistant to Bactrim.    Drinks 72 oz water daily.  No flank pain or gross hematuria.     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   No voiding symptoms.    Cystoscopy 12/31/16 negative for bladder pathology.  Previous cytology negative.    3- Pelvic pain Nonspecific pelvic pain for several months following UTI. Workup for this with Dr. Avelina Laine including CT abd/ pelvis with contrast 8/17.  Personal history of endometrial cancer s/p robotic hysterectomy with BSO 12/2007 with radiation. No evidence of recurrence.    Over the past 6 months, the pelvic pain continues to improve. It is at times related to bladder filling. This is not severe.  PMH: Past Medical History:  Diagnosis Date  . Anxiety   . Cancer [facility])    Uterine Cancer  . GERD (gastroesophageal reflux disease)   . History of kidney stones   . Hypercholesterolemia   . Hypertension   . Hypothyroidism   . Kidney stone   . Osteopenia     Surgical History: Past Surgical History:  Procedure Laterality Date  . ABDOMINAL HYSTERECTOMY    . CATARACT EXTRACTION W/PHACO Right 12/07/2016   Procedure: CATARACT EXTRACTION PHACO AND  INTRAOCULAR LENS PLACEMENT (IOC);  Surgeon: Birder Robson, MD;  Location: [facility] ORS;  Service: Ophthalmology;  Laterality: Right;  [place] 42.5 AP% 23.2 CDE 9.87 Fluid pack lot # [id number]  . CATARACT EXTRACTION W/PHACO Left 01/04/2017   Procedure: CATARACT EXTRACTION PHACO AND INTRAOCULAR LENS PLACEMENT (IOC);  Surgeon: Birder Robson, MD;  Location: [facility] ORS;  Service: Ophthalmology;  Laterality: Left;  [place] 39.2 AP% 23.9 CDE 9.38 Fluid Pack Lot # [id number]  . CHOLECYSTECTOMY    . DILATION AND CURETTAGE OF UTERUS    . KIDNEY STONE SURGERY    . KNEE ARTHROSCOPY      Home Medications:  Allergies as of 07/01/2017      Reactions   Statins    Myalgia       Medication List       Accurate as of 07/01/17 11:45 AM. Always use your most recent med list.          aspirin EC 81 MG tablet Take 81 mg by mouth daily.   Co Q-10 100 MG Caps Take 100 mg by mouth daily.   ezetimibe 10 MG tablet Commonly known as:  ZETIA Take 10 mg by mouth daily.   Glucosamine-Chondroitin 750-600 MG Tabs Take 1 tablet by mouth daily.   hydrochlorothiazide 25 MG tablet Commonly known as:  HYDRODIURIL Take 12.5 mg by mouth daily.   levothyroxine 25 MCG tablet Commonly known as:  SYNTHROID, LEVOTHROID Take 25 mcg by mouth daily before breakfast.   Multiple Vitamins tablet Take 1 tablet by mouth daily.  OMEGA 3 500 500 MG Caps Take 500 mg by mouth daily.   pantoprazole 40 MG tablet Commonly known as:  PROTONIX Take 40 mg by mouth daily.   sertraline 50 MG tablet Commonly known as:  ZOLOFT Take 50 mg by mouth daily.       Allergies:  Allergies  Allergen Reactions  . Statins     Myalgia     Family History: Family History  Problem Relation Age of Onset  . Breast cancer Paternal Aunt     Social History:  reports that she has never smoked. She has never used smokeless tobacco. She reports that she does not drink alcohol or use drugs.  ROS: UROLOGY Frequent Urination?: No Hard to  postpone urination?: No Burning/pain with urination?: No Get up at night to urinate?: No Leakage of urine?: No Urine stream starts and stops?: No Trouble starting stream?: No Do you have to strain to urinate?: No Blood in urine?: No Urinary tract infection?: No Sexually transmitted disease?: No Injury to kidneys or bladder?: No Painful intercourse?: No Weak stream?: No Currently pregnant?: No Vaginal bleeding?: No Last menstrual period?: n  Gastrointestinal Nausea?: No Vomiting?: No Indigestion/heartburn?: No Diarrhea?: No Constipation?: No  Constitutional Fever: No Night sweats?: No Weight loss?: No Fatigue?: No  Skin Skin rash/lesions?: No Itching?: No  Eyes Blurred vision?: No Double vision?: No  Ears/Nose/Throat Sore throat?: No Sinus problems?: No  Hematologic/Lymphatic Swollen glands?: No Easy bruising?: No  Cardiovascular Leg swelling?: No Chest pain?: No  Respiratory Cough?: No Shortness of breath?: No  Endocrine Excessive thirst?: No  Musculoskeletal Back pain?: No Joint pain?: No  Neurological Headaches?: No Dizziness?: No  Psychologic Depression?: No Anxiety?: No  Physical Exam: BP 132/79   Pulse 71   Ht 5\' 4"  (1.626 m)   Wt 192 lb (87.1 kg)   BMI 32.96 kg/m   Constitutional:  Alert and oriented, No acute distress. HEENT: [place] AT, moist mucus membranes.  Trachea midline, no masses. Cardiovascular: No clubbing, cyanosis.  Respiratory: Normal respiratory effort, no increased work of breathing. GI: Abdomen is soft, nontender, nondistended, no abdominal masses. Obese.  No suprapubic pain.  GU: No CVA tenderness.  Skin: No rashes, bruises or suspicious lesions. Neurologic: Grossly intact, no focal deficits, moving all 4 extremities. Psychiatric: Normal mood and affect.  Laboratory Data: Cr 0.9 on 01/2017  Imaging: CLINICAL DATA:  Nephrolithiasis  EXAM: ABDOMEN - 1 VIEW  COMPARISON:  August 12, 2016 CT abdomen and  pelvis ; abdominal radiograph November 25, 2015  FINDINGS: There is a staghorn type calculus in the right mid kidney region measuring 3.2 x 1.2 cm. No other calcifications evident. There is moderate stool in colon. There is no bowel dilatation or air-fluid level to suggest bowel obstruction. No free air. Liver appears prominent, similar to prior CT. Lung bases clear.  IMPRESSION: Staghorn calculus right mid kidney measuring 3.2 x 1.2 cm. No other calculi evident. Bowel gas pattern unremarkable. No obstruction or free air. Liver prominence, also seen on prior CT.   Electronically Signed   By: Lowella Grip III M.D.   On: 06/30/2017 09:45  KUB was personally reviewed today. This is compared to previous CT scan in 2017. Staghorn appears to be unchanged.  Assessment & Plan:    1. Staghorn kidney stones Stable RLP staghorn, 3.1 x 1.1 cm, essentially stable on KUB today Asymptomatic with only single UTI, 05/2016 Interested in continued observation Reviewed stone prevention techniques F/u in 12 months with KUB Return sooner  as needed-  For flank pain, gross hematuria, UTI  2. Microscopic hematuria Chronic, likely related to stones Personal history of pelvic radiation Recheck UA next visit, if microscopic hematuria persist we'll pursue cystoscopy again given risk factors for bladder cancer   Return in about 1 year (around 07/01/2018) for KUB, UA, possible cysto.  Hollice Espy, MD  [facility] [address] [place], [place] [address] [phone]

## 2017-07-26 ENCOUNTER — Other Ambulatory Visit: Payer: Medicare Other

## 2017-07-26 DIAGNOSIS — R102 Pelvic and perineal pain: Secondary | ICD-10-CM

## 2017-07-26 LAB — URINALYSIS, COMPLETE
BILIRUBIN UA: NEGATIVE
GLUCOSE, UA: NEGATIVE
KETONES UA: NEGATIVE
NITRITE UA: NEGATIVE
Protein, UA: NEGATIVE
SPEC GRAV UA: 1.015 (ref 1.005–1.030)
Urobilinogen, Ur: 0.2 mg/dL (ref 0.2–1.0)
pH, UA: 6 (ref 5.0–7.5)

## 2017-07-26 LAB — MICROSCOPIC EXAMINATION: Epithelial Cells (non renal): NONE SEEN /hpf (ref 0–10)

## 2017-07-27 ENCOUNTER — Other Ambulatory Visit: Payer: Self-pay

## 2017-07-27 ENCOUNTER — Telehealth: Payer: Self-pay

## 2017-07-27 NOTE — Telephone Encounter (Signed)
-----   Message from Hollice Espy, MD sent at 07/27/2017 11:00 AM EDT ----- What was this urine drop off yesterday?  I don't see any messages or notes.    Hollice Espy, MD

## 2017-07-27 NOTE — Telephone Encounter (Signed)
Morgan Dalton stated that pt walked in saying Dr. Erlene Quan told her anytime she had a UTI to come in and give a urine sample. Attempted to call pt to see what her s/s of a UTI were. LMOM

## 2017-07-27 NOTE — Telephone Encounter (Signed)
Patient's symptoms include tea colored urine and passing a few small clots. UA not particularly suspicious for infection. She has no other signs or symptoms of UTI. We'll send culture to roll this out.  Given that she has ongoing grossly metastatic hematuria, I would recommend follow-up with cystoscopy. Patient is unable this plan.  Hollice Espy, MD

## 2017-07-29 ENCOUNTER — Telehealth: Payer: Self-pay

## 2017-07-29 LAB — CULTURE, URINE COMPREHENSIVE

## 2017-07-29 MED ORDER — SULFAMETHOXAZOLE-TRIMETHOPRIM 800-160 MG PO TABS: 1.0000 | ORAL_TABLET | Freq: Two times a day (BID) | ORAL | 0 refills | Status: DC

## 2017-07-29 NOTE — Telephone Encounter (Signed)
Patient notified and script sent 

## 2017-07-29 NOTE — Telephone Encounter (Signed)
-----   Message from Hollice Espy, MD sent at 07/29/2017  1:18 PM EDT ----- Urine culture did end up growing bacteria, staph species which sometimes can be a contaminant. Given her symptoms though, and like to go ahead and treat her with Bactrim DS twice a day 7 days.  Hollice Espy, MD

## 2017-08-02 ENCOUNTER — Ambulatory Visit (INDEPENDENT_AMBULATORY_CARE_PROVIDER_SITE_OTHER): Payer: Medicare Other | Admitting: Urology

## 2017-08-02 ENCOUNTER — Encounter: Payer: Self-pay | Admitting: Urology

## 2017-08-02 VITALS — BP 119/68 | HR 87 | Ht 64.0 in | Wt 185.0 lb

## 2017-08-02 DIAGNOSIS — N200 Calculus of kidney: Secondary | ICD-10-CM

## 2017-08-02 DIAGNOSIS — R310 Gross hematuria: Secondary | ICD-10-CM

## 2017-08-02 LAB — URINALYSIS, COMPLETE
Bilirubin, UA: NEGATIVE
Glucose, UA: NEGATIVE
Ketones, UA: NEGATIVE
Nitrite, UA: NEGATIVE
PH UA: 6 (ref 5.0–7.5)
PROTEIN UA: NEGATIVE
Specific Gravity, UA: 1.015 (ref 1.005–1.030)
Urobilinogen, Ur: 0.2 mg/dL (ref 0.2–1.0)

## 2017-08-02 LAB — MICROSCOPIC EXAMINATION
BACTERIA UA: NONE SEEN
EPITHELIAL CELLS (NON RENAL): NONE SEEN /HPF (ref 0–10)

## 2017-08-02 MED ORDER — CIPROFLOXACIN HCL 500 MG PO TABS: 500.0000 mg | ORAL_TABLET | Freq: Once | ORAL | Status: DC

## 2017-08-02 MED ORDER — LIDOCAINE HCL 2 % EX GEL
1.0000 "application " | Freq: Once | CUTANEOUS | Status: AC
  Administered 2017-08-02: 1 via URETHRAL

## 2017-08-08 ENCOUNTER — Ambulatory Visit: Payer: Medicare Other

## 2017-08-08 VITALS — BP 124/82 | HR 77 | Ht 64.0 in | Wt 182.6 lb

## 2017-08-08 DIAGNOSIS — N3020 Other chronic cystitis without hematuria: Secondary | ICD-10-CM

## 2017-08-08 LAB — URINALYSIS, COMPLETE
Bilirubin, UA: NEGATIVE
Glucose, UA: NEGATIVE
KETONES UA: NEGATIVE
NITRITE UA: NEGATIVE
PH UA: 5.5 (ref 5.0–7.5)
Protein, UA: NEGATIVE
SPEC GRAV UA: 1.01 (ref 1.005–1.030)
Urobilinogen, Ur: 0.2 mg/dL (ref 0.2–1.0)

## 2017-08-08 LAB — MICROSCOPIC EXAMINATION

## 2017-08-08 NOTE — Progress Notes (Signed)
Pt walked in today stating "Dr. Erlene Quan told me to just drop off a urine. Dr. Erlene Quan also told me I did not have to do a nurse visit. I dont know why yall are making me go do all of this!" Reinforced with pt all urine drop off's have to be on the nurse schedule. Pt was not happy but allowed VS to be taken. Pt also requested that her urine be sent for culture. Pt did not circle any UTI s/s. Pt did state that she has been on abx in the last 30 days. U/a and culture orders placed.   Blood pressure 124/82, pulse 77, height 5\' 4"  (1.626 m), weight 182 lb 9.6 oz (82.8 kg).

## 2017-08-10 LAB — CULTURE, URINE COMPREHENSIVE

## 2017-08-16 NOTE — Telephone Encounter (Signed)
-----   Message from Hollice Espy, MD sent at 08/11/2017  9:44 AM EDT ----- Low colony count of staph epidermidis which is a skin contaminant.  No further infection.  Great news.  Hollice Espy, MD

## 2017-08-16 NOTE — Telephone Encounter (Signed)
Spoke with pt in reference to ucx results. Pt voiced understanding.  

## 2017-08-22 NOTE — Progress Notes (Signed)
   08/22/17  CC:  Chief Complaint  Patient presents with  . Cysto    HPI: 66 year old female with a known right partial staghorn calculus who presented with T colored urine and a few small clots but no other signs or symptoms of UTI.  UA was unremarkable. She did have a very low colony count of staph epidermidis which appeared to be a contaminant. She presents today for cystoscopy to rule out any underlying bladder pathology.  Blood pressure 119/68, pulse 87, height 5\' 4"  (1.626 m), weight 185 lb (83.9 kg). NED. A&Ox3.   No respiratory distress   Abd soft, NT, ND Normal external genitalia with patent urethral meatus  Cystoscopy Procedure Note  Patient identification was confirmed, informed consent was obtained, and patient was prepped using Betadine solution.  Lidocaine jelly was administered per urethral meatus.    Preoperative abx where received prior to procedure.    Procedure: - Flexible cystoscope introduced, without any difficulty.   - Thorough search of the bladder revealed:    normal urethral meatus    normal urothelium    no stones    no ulcers     no tumors    no urethral polyps    no trabeculation  - Ureteral orifices were normal in position and appearance.  Post-Procedure: - Patient tolerated the procedure well  Assessment/ Plan:  1. Gross hematuria- likely secondary to right partial staghorn stone. We did treat urine culture growing Staohylococcus lugdenesis as presumed infection as precation, recommend repeat f/u UCx after abx complete.    2.  Staghorn- relatively asymptomatic in the past, however, recent episode of gross hematuria. If she continues to have episodes of hematuria, flank pain, and/or recurrent infections, we'll push for treatment of her stone.   F/u as previously scheduled  Hollice Espy, MD

## 2017-12-27 ENCOUNTER — Other Ambulatory Visit: Payer: Self-pay | Admitting: Internal Medicine

## 2017-12-27 DIAGNOSIS — Z1231 Encounter for screening mammogram for malignant neoplasm of breast: Secondary | ICD-10-CM

## 2018-02-10 ENCOUNTER — Ambulatory Visit
Admission: RE | Admit: 2018-02-10 | Discharge: 2018-02-10 | Disposition: A | Discharge status: Home / Self Care | Payer: Medicare Other | Source: Ambulatory Visit | Attending: Internal Medicine | Admitting: Internal Medicine

## 2018-02-10 DIAGNOSIS — Z1231 Encounter for screening mammogram for malignant neoplasm of breast: Secondary | ICD-10-CM

## 2018-06-29 ENCOUNTER — Ambulatory Visit
Admission: RE | Admit: 2018-06-29 | Discharge: 2018-06-29 | Disposition: A | Discharge status: Home / Self Care | Payer: Medicare Other | Source: Ambulatory Visit | Attending: Urology | Admitting: Urology

## 2018-06-29 DIAGNOSIS — N200 Calculus of kidney: Secondary | ICD-10-CM | POA: Diagnosis present

## 2018-06-30 ENCOUNTER — Ambulatory Visit (INDEPENDENT_AMBULATORY_CARE_PROVIDER_SITE_OTHER): Payer: Medicare Other | Admitting: Urology

## 2018-06-30 ENCOUNTER — Encounter: Payer: Self-pay | Admitting: Urology

## 2018-06-30 VITALS — BP 124/79 | HR 73 | Ht 64.0 in | Wt 180.0 lb

## 2018-06-30 DIAGNOSIS — R3129 Other microscopic hematuria: Secondary | ICD-10-CM

## 2018-06-30 DIAGNOSIS — N200 Calculus of kidney: Secondary | ICD-10-CM | POA: Diagnosis not present

## 2018-06-30 LAB — MICROSCOPIC EXAMINATION: EPITHELIAL CELLS (NON RENAL): NONE SEEN /HPF (ref 0–10)

## 2018-06-30 LAB — URINALYSIS, COMPLETE
Bilirubin, UA: NEGATIVE
GLUCOSE, UA: NEGATIVE
KETONES UA: NEGATIVE
NITRITE UA: NEGATIVE
Specific Gravity, UA: 1.02 (ref 1.005–1.030)
UUROB: 0.2 mg/dL (ref 0.2–1.0)
pH, UA: 7 (ref 5.0–7.5)

## 2018-06-30 NOTE — Progress Notes (Signed)
06/30/2018 12:57 PM   Morgan Dalton 25-Nov-1951 [id number]  Referring provider: Adin Hector, MD [place] [facility], [place] [address]  Chief Complaint  Patient presents with  . Nephrolithiasis    1year w/KUB    HPI: 67 year old female who returns for annual follow up.  Over the past year, she had no significant changes in her past medical history.  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  S/p PCNL '82, L basket extraction '93, L ESWL '95, passed 2 additional stones spontaneously '15 and '16.  Right nonobstructing partial staghorn involving right lower pole, stable x many years. Measures partly 3.1 x 1.1 cm. Previously followed by Dr. Jacqlyn Larsen with observation.    Rare UTIs, most recent UTI approximately 1 year ago.  No flank pain or gross hematuria this year.  No significant urinary complaints.  KUB today with stable large 3.1 cm partial staghorn.  No progression.   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   No voiding symptoms.    Cystoscopy 8/18 negative for bladder pathology.  Previous cytology negative.    3- Pelvic pain Nonspecific pelvic pain for several months following UTI. Workup for this with Dr. Avelina Laine including CT abd/ pelvis with contrast 8/17.  Personal history of endometrial cancer s/p robotic hysterectomy with BSO 12/2007 with radiation.  No further pelvic pain over the past year.   PMH: Past Medical History:  Diagnosis Date  . Anxiety   . Cancer [facility])    Uterine Cancer  . GERD (gastroesophageal reflux disease)   . History of kidney stones   . Hypercholesterolemia   . Hypertension   . Hypothyroidism   . Kidney stone   . Osteopenia     Surgical History: Past Surgical History:  Procedure Laterality Date  . ABDOMINAL HYSTERECTOMY    . CATARACT EXTRACTION W/PHACO Right 12/07/2016   Procedure: CATARACT EXTRACTION PHACO AND INTRAOCULAR LENS PLACEMENT (IOC);  Surgeon: Birder Robson, MD;  Location: [facility] ORS;  Service: Ophthalmology;  Laterality: Right;  [place] 42.5 AP% 23.2 CDE 9.87 Fluid pack lot # [id number]  . CATARACT EXTRACTION W/PHACO Left 01/04/2017   Procedure: CATARACT EXTRACTION PHACO AND INTRAOCULAR LENS PLACEMENT (IOC);  Surgeon: Birder Robson, MD;  Location: [facility] ORS;  Service: Ophthalmology;  Laterality: Left;  [place] 39.2 AP% 23.9 CDE 9.38 Fluid Pack Lot # [id number]  . CHOLECYSTECTOMY    . DILATION AND CURETTAGE OF UTERUS    . KIDNEY STONE SURGERY    . KNEE ARTHROSCOPY      Home Medications:  Allergies as of 06/30/2018      Reactions   Statins    Myalgia       Medication List        Accurate as of 06/30/18 11:59 PM. Always use your most recent med list.          aspirin EC 81 MG tablet Take 81 mg by mouth daily.   Co Q-10 100 MG Caps Take 100 mg by mouth daily.   ezetimibe 10 MG tablet Commonly known as:  ZETIA Take 10 mg by mouth daily.   Glucosamine-Chondroitin 750-600 MG Tabs Take 1 tablet by mouth daily.   hydrochlorothiazide 25 MG tablet Commonly known as:  HYDRODIURIL Take 12.5 mg by mouth daily.   levothyroxine 25 MCG tablet Commonly known as:  SYNTHROID, LEVOTHROID Take 25 mcg by mouth daily before breakfast.   Multiple Vitamins tablet Take 1  tablet by mouth daily.   OMEGA 3 500 500 MG Caps Take 500 mg by mouth daily.   pantoprazole 40 MG tablet Commonly known as:  PROTONIX Take 40 mg by mouth daily.       Allergies:  Allergies  Allergen Reactions  . Statins     Myalgia     Family History: Family History  Problem Relation Age of Onset  . Breast cancer Paternal Aunt     Social History:  reports that she has never smoked. She has never used smokeless tobacco. She reports that she does not drink alcohol or use drugs.  ROS: UROLOGY Frequent Urination?: No Hard to postpone urination?: No Burning/pain with  urination?: No Get up at night to urinate?: No Leakage of urine?: No Urine stream starts and stops?: No Trouble starting stream?: No Do you have to strain to urinate?: No Blood in urine?: No Urinary tract infection?: No Sexually transmitted disease?: No Injury to kidneys or bladder?: No Painful intercourse?: No Weak stream?: No Currently pregnant?: No Vaginal bleeding?: No Last menstrual period?: n  Gastrointestinal Nausea?: No Vomiting?: No Indigestion/heartburn?: No Diarrhea?: No Constipation?: No  Constitutional Fever: No Night sweats?: No Weight loss?: No Fatigue?: No  Skin Skin rash/lesions?: No Itching?: No  Eyes Blurred vision?: No Double vision?: No  Ears/Nose/Throat Sore throat?: No Sinus problems?: No  Hematologic/Lymphatic Swollen glands?: No Easy bruising?: No  Cardiovascular Leg swelling?: No Chest pain?: No  Respiratory Cough?: No Shortness of breath?: No  Endocrine Excessive thirst?: No  Musculoskeletal Back pain?: No Joint pain?: No  Neurological Headaches?: No Dizziness?: No  Psychologic Depression?: No Anxiety?: No  Physical Exam: BP 124/79   Pulse 73   Ht 5\' 4"  (1.626 m)   Wt 180 lb (81.6 kg)   BMI 30.90 kg/m   Constitutional:  Alert and oriented, No acute distress. HEENT: [place] AT, moist mucus membranes.  Trachea midline, no masses. Cardiovascular: No clubbing, cyanosis, or edema. Respiratory: Normal respiratory effort, no increased work of breathing. GU: No CVA tenderness Skin: No rashes, bruises or suspicious lesions. Neurologic: Grossly intact, no focal deficits, moving all 4 extremities. Psychiatric: Normal mood and affect.  Laboratory Data: Lab Results  Component Value Date   WBC 7.9 05/09/2015   HGB 12.9 05/09/2015   HCT 39.1 05/09/2015   MCV 88.2 05/09/2015   PLT 361 05/09/2015    Lab Results  Component Value Date   CREATININE 0.88 11/25/2015    Urinalysis Results for orders placed or performed  in visit on 06/30/18  Microscopic Examination  Result Value Ref Range   WBC, UA 0-5 0 - 5 /hpf   RBC, UA 11-30 (A) 0 - 2 /hpf   Epithelial Cells (non renal) None seen 0 - 10 /hpf   Mucus, UA Present (A) Not Estab.   Bacteria, UA Few (A) None seen/Few  Urinalysis, Complete  Result Value Ref Range   Specific Gravity, UA 1.020 1.005 - 1.030   pH, UA 7.0 5.0 - 7.5   Color, UA Yellow Yellow   Appearance Ur Clear Clear   Leukocytes, UA 1+ (A) Negative   Protein, UA 1+ (A) Negative/Trace   Glucose, UA Negative Negative   Ketones, UA Negative Negative   RBC, UA 2+ (A) Negative   Bilirubin, UA Negative Negative   Urobilinogen, Ur 0.2 0.2 - 1.0 mg/dL   Nitrite, UA Negative Negative   Microscopic Examination See below:     Pertinent Imaging: Results for orders placed during the hospital encounter of  06/29/18  Abdomen 1 view (KUB)   Narrative CLINICAL DATA:  Kidney stone follow-up.  EXAM: ABDOMEN - 1 VIEW  COMPARISON:  06/30/2017.  FINDINGS: Stable right nephrolithiasis. No evidence of urolithiasis. Pelvic consistent phleboliths. Stool noted throughout the colon. Degenerative change lumbar spine and both hips.  IMPRESSION: 1.  Stable right nephrolithiasis.  2.  Stool noted throughout the colon suggesting constipation.   Electronically Signed   By: Marcello Moores  Register   On: 06/29/2018 14:22    KUB imaging personally reviewed today, stable and unchanged from KUB last year  Assessment & Plan:    1. Microscopic hematuria Persistent microscopic hematuria Given inability to differentiate blood related to stone versus underlying bladder pathology Recommend cystoscopy every 2 to 3 years if microscopic hematuria persist She is agreeable with this plan - Urinalysis, Complete  2. Staghorn kidney stones Stable asymptomatic right partial staghorn calculus Stable times many years She is otherwise asymptomatic without infections, flank pain, or any other symptoms She would  continue to prefer observation Plan for KUB again in a year to assess for interval growth Follow-up sooner as needed - DG Abd 1 View; Future   Return in about 1 year (around 07/01/2019) for KUB, cysto.  Hollice Espy, MD  [facility] [address], [place], [place] [address] [phone]

## 2018-11-06 ENCOUNTER — Encounter: Payer: Self-pay | Admitting: *Deleted

## 2018-11-07 ENCOUNTER — Ambulatory Visit: Payer: Medicare Other | Admitting: Anesthesiology

## 2018-11-07 ENCOUNTER — Encounter
Admission: RE | Disposition: A | Discharge status: Home / Self Care | Payer: Self-pay | Source: Ambulatory Visit | Attending: Gastroenterology

## 2018-11-07 ENCOUNTER — Ambulatory Visit
Admission: RE | Admit: 2018-11-07 | Discharge: 2018-11-07 | Disposition: A | Discharge status: Home / Self Care | Payer: Medicare Other | Source: Ambulatory Visit | Attending: Gastroenterology | Admitting: Gastroenterology

## 2018-11-07 ENCOUNTER — Encounter: Payer: Self-pay | Admitting: *Deleted

## 2018-11-07 DIAGNOSIS — E039 Hypothyroidism, unspecified: Secondary | ICD-10-CM | POA: Diagnosis not present

## 2018-11-07 DIAGNOSIS — I10 Essential (primary) hypertension: Secondary | ICD-10-CM | POA: Diagnosis not present

## 2018-11-07 DIAGNOSIS — E7800 Pure hypercholesterolemia, unspecified: Secondary | ICD-10-CM | POA: Diagnosis not present

## 2018-11-07 DIAGNOSIS — Z8542 Personal history of malignant neoplasm of other parts of uterus: Secondary | ICD-10-CM | POA: Insufficient documentation

## 2018-11-07 DIAGNOSIS — Q438 Other specified congenital malformations of intestine: Secondary | ICD-10-CM | POA: Insufficient documentation

## 2018-11-07 DIAGNOSIS — Z09 Encounter for follow-up examination after completed treatment for conditions other than malignant neoplasm: Secondary | ICD-10-CM | POA: Insufficient documentation

## 2018-11-07 DIAGNOSIS — Z86010 Personal history of colonic polyps: Secondary | ICD-10-CM | POA: Insufficient documentation

## 2018-11-07 DIAGNOSIS — Z79899 Other long term (current) drug therapy: Secondary | ICD-10-CM | POA: Insufficient documentation

## 2018-11-07 DIAGNOSIS — K219 Gastro-esophageal reflux disease without esophagitis: Secondary | ICD-10-CM | POA: Diagnosis not present

## 2018-11-07 DIAGNOSIS — Z79890 Hormone replacement therapy (postmenopausal): Secondary | ICD-10-CM | POA: Diagnosis not present

## 2018-11-07 DIAGNOSIS — Z7982 Long term (current) use of aspirin: Secondary | ICD-10-CM | POA: Insufficient documentation

## 2018-11-07 DIAGNOSIS — K5730 Diverticulosis of large intestine without perforation or abscess without bleeding: Secondary | ICD-10-CM | POA: Insufficient documentation

## 2018-11-07 HISTORY — PX: COLONOSCOPY WITH PROPOFOL: SHX5780

## 2018-11-07 SURGERY — COLONOSCOPY WITH PROPOFOL
Anesthesia: General

## 2018-11-07 MED ORDER — PROPOFOL 500 MG/50ML IV EMUL
INTRAVENOUS | Status: DC | PRN
  Administered 2018-11-07: 150 ug/kg/min via INTRAVENOUS

## 2018-11-07 MED ORDER — SODIUM CHLORIDE 0.9 % IV SOLN
INTRAVENOUS | Status: DC
  Administered 2018-11-07: 1000 mL via INTRAVENOUS

## 2018-11-07 MED ORDER — PROPOFOL 500 MG/50ML IV EMUL
INTRAVENOUS | Status: AC
  Filled 2018-11-07: qty 50

## 2018-11-07 MED ORDER — PROPOFOL 10 MG/ML IV BOLUS
INTRAVENOUS | Status: DC | PRN
  Administered 2018-11-07: 100 mg via INTRAVENOUS

## 2018-11-07 MED ORDER — LIDOCAINE 2% (20 MG/ML) 5 ML SYRINGE
INTRAMUSCULAR | Status: DC | PRN
  Administered 2018-11-07: 30 mg via INTRAVENOUS

## 2018-11-07 MED ORDER — FENTANYL CITRATE (PF) 100 MCG/2ML IJ SOLN
INTRAMUSCULAR | Status: AC
  Filled 2018-11-07: qty 2

## 2018-11-07 MED ORDER — FENTANYL CITRATE (PF) 100 MCG/2ML IJ SOLN
INTRAMUSCULAR | Status: DC | PRN
  Administered 2018-11-07: 50 ug via INTRAVENOUS
  Administered 2018-11-07 (×2): 25 ug via INTRAVENOUS

## 2018-11-07 NOTE — Transfer of Care (Signed)
Immediate Anesthesia Transfer of Care Note  Patient: Morgan Dalton  Procedure(s) Performed: COLONOSCOPY WITH PROPOFOL (N/A )  Patient Location: PACU and Endoscopy Unit  Anesthesia Type:General  Level of Consciousness: sedated  Airway & Oxygen Therapy: Patient Spontanous Breathing and Patient connected to nasal cannula oxygen  Post-op Assessment: Report given to RN and Post -op Vital signs reviewed and stable  Post vital signs: Reviewed and stable  Last Vitals:  Vitals Value Taken Time  BP    Temp    Pulse    Resp    SpO2      Last Pain:  Vitals:   11/07/18 0958  TempSrc: Tympanic  PainSc: 0-No pain         Complications: No apparent anesthesia complications

## 2018-11-07 NOTE — Op Note (Signed)
[facility] Gastroenterology Patient Name: Morgan Dalton Procedure Date: 11/07/2018 10:14 AM MRN: [id number] Account #: [id number] Date of Birth: [birth year] Admit Type: Outpatient Age: 67 Room: [facility] ENDO ROOM 3 Gender: Female Note Status: Finalized Procedure:            Colonoscopy Indications:          Personal history of colonic polyps Providers:            Lollie Sails, MD Referring MD:         Ernst Spell Medicines:            Monitored Anesthesia Care Complications:        No immediate complications. Procedure:            Pre-Anesthesia Assessment:                       - ASA Grade Assessment: II - A patient with mild                        systemic disease.                       After obtaining informed consent, the colonoscope was                        passed under direct vision. Throughout the procedure,                        the patient's blood pressure, pulse, and oxygen                        saturations were monitored continuously. The                        Colonoscope was introduced through the anus and                        advanced to the the cecum, identified by appendiceal                        orifice and ileocecal valve. The colonoscopy was                        performed without difficulty. The patient tolerated the                        procedure well. The quality of the bowel preparation                        was good. Findings:      The sigmoid colon, descending colon and transverse colon were       significantly tortuous.      A few small-mouthed diverticula were found in the sigmoid colon.      The digital rectal exam was normal.      The exam was otherwise without abnormality. Impression:           - Tortuous colon.                       - Diverticulosis in the sigmoid colon.                       -  The examination was otherwise normal.                       - No specimens collected. Recommendation:       - Discharge  patient to home.                       - Advance diet as tolerated.                       - Repeat colonoscopy in 5 years for screening purposes. Procedure Code(s):    --- Professional ---                       [phone], Colonoscopy, flexible; diagnostic, including                        collection of specimen(s) by brushing or washing, when                        performed (separate procedure) Diagnosis Code(s):    --- Professional ---                       Z86.010, Personal history of colonic polyps                       K57.30, Diverticulosis of large intestine without                        perforation or abscess without bleeding                       Q43.8, Other specified congenital malformations of                        intestine CPT copyright 2018 American Medical Association. All rights reserved. The codes documented in this report are preliminary and upon coder review may  be revised to meet current compliance requirements. Lollie Sails, MD 11/07/2018 10:48:45 AM This report has been signed electronically. Number of Addenda: 0 Note Initiated On: 11/07/2018 10:14 AM Scope Withdrawal Time: 0 hours 9 minutes 1 second  Total Procedure Duration: 0 hours 18 minutes 48 seconds       [facility]

## 2018-11-07 NOTE — Anesthesia Preprocedure Evaluation (Signed)
Anesthesia Evaluation  Patient identified by MRN, date of birth, ID band Patient awake    Reviewed: Allergy & Precautions, H&P , NPO status , Patient's Chart, lab work & pertinent test results, reviewed documented beta blocker date and time   History of Anesthesia Complications Negative for: history of anesthetic complications  Airway Mallampati: I  TM Distance: >3 FB Neck ROM: full    Dental  (+) Dental Advidsory Given, Caps, Teeth Intact   Pulmonary neg pulmonary ROS,           Cardiovascular Exercise Tolerance: Good hypertension, (-) angina(-) CAD, (-) Past MI, (-) Cardiac Stents and (-) CABG (-) dysrhythmias (-) Valvular Problems/Murmurs     Neuro/Psych PSYCHIATRIC DISORDERS Anxiety Depression negative neurological ROS     GI/Hepatic Neg liver ROS, GERD  ,  Endo/Other  neg diabetesHypothyroidism   Renal/GU Renal disease (kidney stones)  negative genitourinary   Musculoskeletal   Abdominal   Peds  Hematology negative hematology ROS (+)   Anesthesia Other Findings Past Medical History: No date: Anxiety No date: Cancer [facility])     Comment:  Uterine Cancer No date: GERD (gastroesophageal reflux disease) No date: History of kidney stones No date: Hypercholesterolemia No date: Hypertension No date: Hypothyroidism No date: Kidney stone No date: Osteopenia   Reproductive/Obstetrics negative OB ROS                             Anesthesia Physical Anesthesia Plan  ASA: II  Anesthesia Plan: General   Post-op Pain Management:    Induction: Intravenous  PONV Risk Score and Plan: 3 and Propofol infusion and TIVA  Airway Management Planned: Natural Airway and Nasal Cannula  Additional Equipment:   Intra-op Plan:   Post-operative Plan:   Informed Consent: I have reviewed the patients History and Physical, chart, labs and discussed the procedure including the risks, benefits and  alternatives for the proposed anesthesia with the patient or authorized representative who has indicated his/her understanding and acceptance.   Dental Advisory Given  Plan Discussed with: Anesthesiologist, CRNA and Surgeon  Anesthesia Plan Comments:         Anesthesia Quick Evaluation

## 2018-11-07 NOTE — H&P (Signed)
Outpatient short stay form Pre-procedure 11/07/2018 10:10 AM Morgan Sails MD  Primary Physician: Ramonita Lab, MD  Reason for visit: Colonoscopy  History of present illness: Patient is a 67 year old female presenting today for colonoscopy in regards to her personal history of tubular adenomatous colon polyps.  Her last colonoscopy was 10/22/2013.  She tolerated her prep well.  She takes no aspirin or blood thinning agent with the exception of the daily 81 mg aspirin that has been held for about 5 days.      Current Facility-Administered Medications:  .  0.9 %  sodium chloride infusion, , Intravenous, Continuous, Morgan Sails, MD  Medications Prior to Admission  Medication Sig Dispense Refill Last Dose  . cholecalciferol (VITAMIN D3) 10 MCG (400 UNIT) TABS tablet Take 1,000 Units by mouth daily.   Past Week at Unknown time  . Coenzyme Q10 (CO Q-10) 100 MG CAPS Take 100 mg by mouth daily.   Past Week at Unknown time  . ezetimibe (ZETIA) 10 MG tablet Take 10 mg by mouth daily.   11/06/2018 at Unknown time  . Glucosamine-Chondroitin 750-600 MG TABS Take 1 tablet by mouth daily.   Past Week at Unknown time  . hydrochlorothiazide (HYDRODIURIL) 25 MG tablet Take 12.5 mg by mouth daily.   11/06/2018 at Unknown time  . Krill Oil 500 MG CAPS Take 500 mg by mouth daily.   Past Week at Unknown time  . levothyroxine (SYNTHROID, LEVOTHROID) 25 MCG tablet Take 25 mcg by mouth daily before breakfast.   11/06/2018 at Unknown time  . Multiple Vitamins tablet Take 1 tablet by mouth daily.   Past Week at Unknown time  . pantoprazole (PROTONIX) 40 MG tablet Take 40 mg by mouth daily.    11/06/2018 at Unknown time  . rosuvastatin (CRESTOR) 5 MG tablet Take 5 mg by mouth daily.   Past Week at Unknown time  . aspirin EC 81 MG tablet Take 81 mg by mouth daily.    Taking  . Omega-3 Fatty Acids (OMEGA 3 500) 500 MG CAPS Take 500 mg by mouth daily.   Not Taking at Unknown time     Allergies  Allergen  Reactions  . Statins Other (See Comments)    Myalgia      Past Medical History:  Diagnosis Date  . Anxiety   . Cancer [place])    Uterine Cancer  . GERD (gastroesophageal reflux disease)   . History of kidney stones   . Hypercholesterolemia   . Hypertension   . Hypothyroidism   . Kidney stone   . Osteopenia     Review of systems:      Physical Exam    Heart and lungs: Regular rate and rhythm without rub or gallop, lungs are bilaterally clear.    HEENT: Normocephalic atraumatic eyes are anicteric    Other:    Pertinant exam for procedure: Soft nontender nondistended bowel sounds positive normoactive    Planned proceedures: Colonoscopy and indicated procedures. I have discussed the risks benefits and complications of procedures to include not limited to bleeding, infection, perforation and the risk of sedation and the patient wishes to proceed.    Morgan Sails, MD Gastroenterology 11/07/2018  10:10 AM

## 2018-11-07 NOTE — Anesthesia Post-op Follow-up Note (Signed)
Anesthesia QCDR form completed.        

## 2018-11-08 ENCOUNTER — Encounter: Payer: Self-pay | Admitting: Gastroenterology

## 2018-11-08 NOTE — Anesthesia Postprocedure Evaluation (Signed)
Anesthesia Post Note  Patient: Morgan Dalton  Procedure(s) Performed: COLONOSCOPY WITH PROPOFOL (N/A )  Patient location during evaluation: Endoscopy Anesthesia Type: General Level of consciousness: awake and alert Pain management: pain level controlled Vital Signs Assessment: post-procedure vital signs reviewed and stable Respiratory status: spontaneous breathing, nonlabored ventilation, respiratory function stable and patient connected to nasal cannula oxygen Cardiovascular status: blood pressure returned to baseline and stable Postop Assessment: no apparent nausea or vomiting Anesthetic complications: no     Last Vitals:  Vitals:   11/07/18 1114 11/07/18 1124  BP: 127/71 (!) 143/78  Pulse:    Resp:    Temp:    SpO2:      Last Pain:  Vitals:   11/08/18 0731  TempSrc:   PainSc: 0-No pain                 Martha Clan

## 2018-12-28 ENCOUNTER — Other Ambulatory Visit: Payer: Self-pay | Admitting: Internal Medicine

## 2018-12-28 DIAGNOSIS — Z1231 Encounter for screening mammogram for malignant neoplasm of breast: Secondary | ICD-10-CM

## 2019-02-12 ENCOUNTER — Ambulatory Visit
Admission: RE | Admit: 2019-02-12 | Discharge: 2019-02-12 | Disposition: A | Discharge status: Home / Self Care | Payer: Medicare Other | Source: Ambulatory Visit | Attending: Internal Medicine | Admitting: Internal Medicine

## 2019-02-12 DIAGNOSIS — Z1231 Encounter for screening mammogram for malignant neoplasm of breast: Secondary | ICD-10-CM | POA: Diagnosis not present

## 2019-07-02 ENCOUNTER — Other Ambulatory Visit: Payer: Self-pay

## 2019-07-02 ENCOUNTER — Ambulatory Visit
Admission: RE | Admit: 2019-07-02 | Discharge: 2019-07-02 | Disposition: A | Discharge status: Home / Self Care | Payer: Medicare Other | Source: Ambulatory Visit | Attending: Urology | Admitting: Urology

## 2019-07-02 DIAGNOSIS — N200 Calculus of kidney: Secondary | ICD-10-CM | POA: Diagnosis present

## 2019-07-03 ENCOUNTER — Encounter: Payer: Self-pay | Admitting: Urology

## 2019-07-03 ENCOUNTER — Ambulatory Visit (INDEPENDENT_AMBULATORY_CARE_PROVIDER_SITE_OTHER): Payer: Medicare Other | Admitting: Urology

## 2019-07-03 VITALS — BP 160/83 | HR 80 | Ht 64.0 in | Wt 218.0 lb

## 2019-07-03 DIAGNOSIS — R109 Unspecified abdominal pain: Secondary | ICD-10-CM

## 2019-07-03 DIAGNOSIS — R3129 Other microscopic hematuria: Secondary | ICD-10-CM | POA: Diagnosis not present

## 2019-07-03 DIAGNOSIS — N200 Calculus of kidney: Secondary | ICD-10-CM

## 2019-07-03 LAB — URINALYSIS, COMPLETE
Bilirubin, UA: NEGATIVE
Glucose, UA: NEGATIVE
Ketones, UA: NEGATIVE
Nitrite, UA: NEGATIVE
Specific Gravity, UA: 1.02 (ref 1.005–1.030)
Urobilinogen, Ur: 0.2 mg/dL (ref 0.2–1.0)
pH, UA: 6.5 (ref 5.0–7.5)

## 2019-07-03 LAB — MICROSCOPIC EXAMINATION

## 2019-07-03 NOTE — Progress Notes (Signed)
   07/03/19  CC:  Chief Complaint  Patient presents with  . Cysto    HPI: 68 year old female with right lower pole stone and chronic microscopic hematuria who presents today for routine annual KUB and cystoscopy (recommended every 2 to 3 years in the setting of chronic micro-heme).  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 S/p PCNL '82, L basket extraction '93, L ESWL '95, passed 2 additional stones spontaneously '15 and '16.  Right nonobstructing partial staghorn involving right lower pole, stable x many years. Measures partly 3.1 x 1.1cm.Previously followed byDr. Copewithobservation.   KUB today with stable large 3.1 cm partial staghorn.  No progression or changes over past 12 months.  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 No voiding symptoms.   Cystoscopy 8/18 negative for bladder pathology. Previous cytology negative.   3-right flank pain Reports a 3-week bout of right flank pain which she described as constant, dull, aching overlying her right kidney.  This stopped about 1 week ago.  It was not severe.  No any other associated symptoms including no fevers, chills, dysuria.  This is mostly in the daytime only.  She wonders if it may be musculoskeletal.  She is not had any further issues.  Height 5\' 4"  (1.626 m). NED. A&Ox3.   No respiratory distress   Abd soft, NT, ND Normal external genitalia with patent urethral meatus  Cystoscopy Procedure Note  Patient identification was confirmed, informed consent was obtained, and patient was prepped using Betadine solution.  Lidocaine jelly was administered per urethral meatus.    Procedure: - Flexible cystoscope introduced, without any difficulty.   - Thorough search of the bladder revealed:    normal urethral meatus    normal urothelium    no stones    no ulcers     no tumors    no  urethral polyps    no trabeculation  - Ureteral orifices were normal in position and appearance.  Post-Procedure: - Patient tolerated the procedure well  Assessment/ Plan:  1. Staghorn kidney stones Stable right lower pole staghorn measuring 3.2 cm, unchanged times many years She is essentially asymptomatic from this, see discussion below Given lack of interval change, she would like to continue to follow this conservatively as she has in the past Given overall stability, will plan to recheck with KUB in 2 years, advised follow-up sooner if she has any UTIs, flank pain, or any other concerns in the interim  2. Microscopic hematuria Personal history of microscopic hematuria, presumably related to staghorn calculus Asymptomatic Cystoscopy today is unremarkable Continue to recommend cystoscopy every 2 to 3 years as long as her hematuria persist - Urinalysis, Complete  3. Right flank pain Etiology unclear, nature of pain is not specific to the kidney and is resolved If she has recurrence of her pain, she was advised to let us know, would consider CT scan for further diagnostic evaluation and consideration of treatment of her stone    Return in about 2 years (around 07/02/2021) for cysto, KUB.  Hollice Espy, MD

## 2019-07-17 ENCOUNTER — Telehealth: Payer: Self-pay | Admitting: Urology

## 2019-07-17 DIAGNOSIS — N200 Calculus of kidney: Secondary | ICD-10-CM

## 2019-07-17 NOTE — Telephone Encounter (Signed)
Pt called and states that she is still having pain in her side and would like to proceed with the CT that was discussed at her last visit.

## 2019-07-17 NOTE — Telephone Encounter (Signed)
CT stone protocol.  Please help schedule.  Hollice Espy, MD

## 2019-07-17 NOTE — Telephone Encounter (Signed)
Can you see about getting this scheduled?

## 2019-07-18 NOTE — Telephone Encounter (Signed)
Message sent to scheduling to schedule  Morgan Dalton

## 2019-07-23 ENCOUNTER — Telehealth: Payer: Self-pay | Admitting: Family Medicine

## 2019-07-23 ENCOUNTER — Ambulatory Visit
Admission: RE | Admit: 2019-07-23 | Discharge: 2019-07-23 | Disposition: A | Discharge status: Home / Self Care | Payer: Medicare Other | Source: Ambulatory Visit | Attending: Urology | Admitting: Urology

## 2019-07-23 ENCOUNTER — Other Ambulatory Visit: Payer: Self-pay

## 2019-07-23 DIAGNOSIS — N200 Calculus of kidney: Secondary | ICD-10-CM | POA: Diagnosis not present

## 2019-07-23 NOTE — Telephone Encounter (Signed)
-----   Message from Hollice Espy, MD sent at 07/23/2019  3:50 PM EDT ----- Your large partial staghorn stone looks unchanged.  There are no ureteral stones which is good news.  It does look like you may be constipated this may be part of the issue.  I recommend a bowel cleanout followed by a regimen with a stool softener on a regular basis.  If your pain does not improve, we can always get back together and discuss treating your staghorn stone but given that it has not changed in so many years and your pain is more recent, it is unlikely that this is the cause.

## 2019-07-23 NOTE — Telephone Encounter (Signed)
Patient notified and will try Miralax, if the symptoms continue she will contact our office.

## 2019-08-06 ENCOUNTER — Other Ambulatory Visit: Payer: Self-pay | Admitting: Internal Medicine

## 2019-08-06 DIAGNOSIS — R1011 Right upper quadrant pain: Secondary | ICD-10-CM

## 2019-08-06 DIAGNOSIS — Z8542 Personal history of malignant neoplasm of other parts of uterus: Secondary | ICD-10-CM

## 2019-08-13 ENCOUNTER — Ambulatory Visit
Admission: RE | Admit: 2019-08-13 | Discharge: 2019-08-13 | Disposition: A | Discharge status: Home / Self Care | Payer: Medicare Other | Source: Ambulatory Visit | Attending: Internal Medicine | Admitting: Internal Medicine

## 2019-08-13 ENCOUNTER — Other Ambulatory Visit: Payer: Self-pay

## 2019-08-13 DIAGNOSIS — R1011 Right upper quadrant pain: Secondary | ICD-10-CM | POA: Diagnosis present

## 2019-08-13 DIAGNOSIS — Z8542 Personal history of malignant neoplasm of other parts of uterus: Secondary | ICD-10-CM | POA: Diagnosis present

## 2019-08-13 MED ORDER — IOHEXOL 300 MG/ML  SOLN
100.0000 mL | Freq: Once | INTRAMUSCULAR | Status: AC | PRN
  Administered 2019-08-13: 100 mL via INTRAVENOUS

## 2019-08-30 DIAGNOSIS — C4492 Squamous cell carcinoma of skin, unspecified: Secondary | ICD-10-CM

## 2019-08-30 HISTORY — DX: Squamous cell carcinoma of skin, unspecified: C44.92

## 2019-11-29 DIAGNOSIS — D75839 Thrombocytosis, unspecified: Secondary | ICD-10-CM | POA: Insufficient documentation

## 2019-12-18 ENCOUNTER — Other Ambulatory Visit: Payer: Self-pay | Admitting: Family Medicine

## 2019-12-18 ENCOUNTER — Ambulatory Visit
Admission: RE | Admit: 2019-12-18 | Discharge: 2019-12-18 | Disposition: A | Discharge status: Home / Self Care | Payer: Medicare Other | Attending: Urology | Admitting: Urology

## 2019-12-18 ENCOUNTER — Encounter: Payer: Self-pay | Admitting: Urology

## 2019-12-18 ENCOUNTER — Ambulatory Visit
Admission: RE | Admit: 2019-12-18 | Discharge: 2019-12-18 | Disposition: A | Discharge status: Home / Self Care | Payer: Medicare Other | Source: Ambulatory Visit | Attending: Urology | Admitting: Urology

## 2019-12-18 ENCOUNTER — Other Ambulatory Visit: Payer: Self-pay

## 2019-12-18 ENCOUNTER — Ambulatory Visit (INDEPENDENT_AMBULATORY_CARE_PROVIDER_SITE_OTHER): Payer: Medicare Other | Admitting: Urology

## 2019-12-18 VITALS — BP 157/83 | HR 116 | Ht 64.0 in | Wt 210.0 lb

## 2019-12-18 DIAGNOSIS — R300 Dysuria: Secondary | ICD-10-CM | POA: Diagnosis not present

## 2019-12-18 DIAGNOSIS — N200 Calculus of kidney: Secondary | ICD-10-CM | POA: Diagnosis present

## 2019-12-18 DIAGNOSIS — R3129 Other microscopic hematuria: Secondary | ICD-10-CM

## 2019-12-18 LAB — URINALYSIS, COMPLETE
Bilirubin, UA: NEGATIVE
Glucose, UA: NEGATIVE
Ketones, UA: NEGATIVE
Nitrite, UA: NEGATIVE
Protein,UA: NEGATIVE
Specific Gravity, UA: 1.01 (ref 1.005–1.030)
Urobilinogen, Ur: 0.2 mg/dL (ref 0.2–1.0)
pH, UA: 6 (ref 5.0–7.5)

## 2019-12-18 LAB — MICROSCOPIC EXAMINATION: RBC: >30 /hpf — AB (ref 0–2)

## 2019-12-18 MED ORDER — SULFAMETHOXAZOLE-TRIMETHOPRIM 800-160 MG PO TABS: 1.0000 | ORAL_TABLET | Freq: Two times a day (BID) | ORAL | 0 refills | Status: DC

## 2019-12-18 NOTE — Progress Notes (Signed)
12/18/2019 2:01 PM   Morgan Dalton 11-May-1951 [id number]  Referring provider: Adin Hector, MD [facility] [place],  [place] [address]  Chief Complaint  Patient presents with  . Nephrolithiasis    HPI: Morgan Dalton is a 69 year old female with a history of staghorn calculus who presents today complaining of burning with urination.  She states last Thursday she saw a little bit of blood in her underwear.  She then started to have urgency, frequency and suprapubic pain.  Patient denies any gross hematuria, terminal dysuria or flank pain.  Patient denies any fevers, chills, nausea or vomiting.   UA yellow hazy, +1 leuks, 3 + blood on dips and microscopically 11-30 WBC, > 30 RBC and 0-10 epithelial cells.    KUB 12/18/2019 unchanged right renal calculus.     PMH: Past Medical History:  Diagnosis Date  . Anxiety   . Cancer [facility])    Uterine Cancer  . GERD (gastroesophageal reflux disease)   . History of kidney stones   . Hypercholesterolemia   . Hypertension   . Hypothyroidism   . Kidney stone   . Osteopenia     Surgical History: Past Surgical History:  Procedure Laterality Date  . ABDOMINAL HYSTERECTOMY    . CATARACT EXTRACTION W/PHACO Right 12/07/2016   Procedure: CATARACT EXTRACTION PHACO AND INTRAOCULAR LENS PLACEMENT (IOC);  Surgeon: Birder Robson, MD;  Location: [facility] ORS;  Service: Ophthalmology;  Laterality: Right;  [place] 42.5 AP% 23.2 CDE 9.87 Fluid pack lot # [id number]  . CATARACT EXTRACTION W/PHACO Left 01/04/2017   Procedure: CATARACT EXTRACTION PHACO AND INTRAOCULAR LENS PLACEMENT (IOC);  Surgeon: Birder Robson, MD;  Location: [facility] ORS;  Service: Ophthalmology;  Laterality: Left;  [place] 39.2 AP% 23.9 CDE 9.38 Fluid Pack Lot # [id number]  . CHOLECYSTECTOMY    . COLONOSCOPY WITH PROPOFOL N/A 11/07/2018   Procedure: COLONOSCOPY WITH PROPOFOL;  Surgeon: Lollie Sails, MD;  Location: [facility] ENDOSCOPY;  Service: Endoscopy;   Laterality: N/A;  . DILATION AND CURETTAGE OF UTERUS    . EYE SURGERY    . KIDNEY STONE SURGERY    . KNEE ARTHROSCOPY      Home Medications:  Allergies as of 12/18/2019      Reactions   Statins Other (See Comments)   Myalgia       Medication List       Accurate as of December 18, 2019  2:01 PM. If you have any questions, ask your nurse or doctor.        aspirin EC 81 MG tablet Take 81 mg by mouth daily.   cholecalciferol 10 MCG (400 UNIT) Tabs tablet Commonly known as: VITAMIN D3 Take 1,000 Units by mouth daily.   Co Q-10 100 MG Caps Take 100 mg by mouth daily.   ezetimibe 10 MG tablet Commonly known as: ZETIA Take 10 mg by mouth daily.   Glucosamine-Chondroitin 750-600 MG Tabs Take 1 tablet by mouth daily.   hydrochlorothiazide 25 MG tablet Commonly known as: HYDRODIURIL Take 12.5 mg by mouth daily.   Krill Oil 500 MG Caps Take 500 mg by mouth daily.   levothyroxine 25 MCG tablet Commonly known as: SYNTHROID Take 25 mcg by mouth daily before breakfast.   Multiple Vitamins tablet Take 1 tablet by mouth daily.   pantoprazole 40 MG tablet Commonly known as: PROTONIX Take 40 mg by mouth daily.   rosuvastatin 5 MG tablet Commonly known as: CRESTOR Take 5 mg by  mouth daily.   sulfamethoxazole-trimethoprim 800-160 MG tablet Commonly known as: BACTRIM DS Take 1 tablet by mouth every 12 (twelve) hours. Started by: Zara Council, PA-C       Allergies:  Allergies  Allergen Reactions  . Statins Other (See Comments)    Myalgia     Family History: Family History  Problem Relation Age of Onset  . Breast cancer Paternal Aunt     Social History:  reports that she has never smoked. She has never used smokeless tobacco. She reports that she does not drink alcohol or use drugs.  ROS: UROLOGY Frequent Urination?: No Hard to postpone urination?: No Burning/pain with urination?: Yes Get up at night to urinate?: No Leakage of urine?: No Urine stream  starts and stops?: No Trouble starting stream?: No Do you have to strain to urinate?: No Blood in urine?: No Urinary tract infection?: Yes Sexually transmitted disease?: No Injury to kidneys or bladder?: No Painful intercourse?: No Weak stream?: No Currently pregnant?: No Vaginal bleeding?: No Last menstrual period?: n  Gastrointestinal Nausea?: Yes Vomiting?: No Indigestion/heartburn?: No Diarrhea?: No Constipation?: No  Constitutional Fever: No Night sweats?: No Weight loss?: No Fatigue?: No  Skin Skin rash/lesions?: No Itching?: No  Eyes Blurred vision?: No Double vision?: No  Ears/Nose/Throat Sore throat?: No Sinus problems?: No  Hematologic/Lymphatic Swollen glands?: No Easy bruising?: No  Cardiovascular Leg swelling?: No Chest pain?: No  Respiratory Cough?: No Shortness of breath?: No  Endocrine Excessive thirst?: No  Musculoskeletal Back pain?: No Joint pain?: No  Neurological Headaches?: Yes Dizziness?: No  Psychologic Depression?: No Anxiety?: No  Physical Exam: BP (!) 157/83   Pulse (!) 116   Ht 5\' 4"  (1.626 m)   Wt 210 lb (95.3 kg)   BMI 36.05 kg/m   Constitutional:  Well nourished. Alert and oriented, No acute distress. HEENT: [place] AT, mask in place.  Trachea midline, no masses. Cardiovascular: No clubbing, cyanosis, or edema. Respiratory: Normal respiratory effort, no increased work of breathing. Neurologic: Grossly intact, no focal deficits, moving all 4 extremities. Psychiatric: Normal mood and affect.  Laboratory Data: Lab Results  Component Value Date   WBC 7.9 05/09/2015   HGB 12.9 05/09/2015   HCT 39.1 05/09/2015   MCV 88.2 05/09/2015   PLT 361 05/09/2015    Lab Results  Component Value Date   CREATININE 0.88 11/25/2015    No results found for: PSA  No results found for: TESTOSTERONE  No results found for: HGBA1C  No results found for: TSH  No results found for: CHOL, HDL, CHOLHDL, VLDL,  LDLCALC  Lab Results  Component Value Date   AST 27 09/18/2013   Lab Results  Component Value Date   ALT 27 09/18/2013   No components found for: ALKALINEPHOPHATASE No components found for: BILIRUBINTOTAL  No results found for: ESTRADIOL  Urinalysis    Component Value Date/Time   COLORURINE YELLOW 12/31/2016 1311   APPEARANCEUR Cloudy (A) 07/03/2019 0931   LABSPEC <1.005 (L) 12/31/2016 1311   PHURINE 6.0 12/31/2016 1311   GLUCOSEU Negative 07/03/2019 0931   HGBUR MODERATE (A) 12/31/2016 1311   BILIRUBINUR Negative 07/03/2019 0931   KETONESUR NEGATIVE 12/31/2016 1311   PROTEINUR 2+ (A) 07/03/2019 0931   PROTEINUR NEGATIVE 12/31/2016 1311   NITRITE Negative 07/03/2019 0931   NITRITE NEGATIVE 12/31/2016 1311   LEUKOCYTESUR 1+ (A) 07/03/2019 0931    I have reviewed the labs.   Pertinent Imaging: CLINICAL DATA:  Kidney stone. Pelvic pain with urinary urgency, nausea, and  diarrhea for 5 days.  EXAM: ABDOMEN - 1 VIEW  COMPARISON:  CT abdomen and pelvis 08/13/2019. Abdominal radiographs 07/02/2019.  FINDINGS: A 3.7 cm right lower pole renal calculus is unchanged from the prior radiographs. No other definite urinary tract calculi are identified. There is no bowel dilatation. No acute osseous abnormality is seen.  IMPRESSION: Unchanged right renal calculus.   Electronically Signed   By: Logan Bores M.D.   On: 12/18/2019 16:54 I have independently reviewed the films and note the right staghorn and a possible left renal stone  Assessment & Plan:   1. Staghorn kidney stones Stable right staghorn stone ? Left renal stone Obtain RUS for further evaluation  2. Dysuria - Urinalysis, Complete - CULTURE, URINE COMPREHENSIVE Urine sent for culture - started on Septra DS - will adjust if necessary once urine culture results are available  3. Microscopic hematuria Likely due to UTI Will recheck once UTI is treated   Return for pending urine culture  results .  These notes generated with voice recognition software. I apologize for typographical errors.  Zara Council, PA-C  [facility] [address]  [place], [place] [address] [phone]

## 2019-12-20 ENCOUNTER — Ambulatory Visit: Payer: Medicare Other | Attending: Internal Medicine

## 2019-12-20 DIAGNOSIS — Z20822 Contact with and (suspected) exposure to covid-19: Secondary | ICD-10-CM

## 2019-12-20 LAB — CULTURE, URINE COMPREHENSIVE

## 2019-12-21 ENCOUNTER — Other Ambulatory Visit: Payer: Self-pay | Admitting: Family Medicine

## 2019-12-21 MED ORDER — NITROFURANTOIN MONOHYD MACRO 100 MG PO CAPS: 100.0000 mg | ORAL_CAPSULE | Freq: Two times a day (BID) | ORAL | 0 refills | Status: DC

## 2019-12-21 NOTE — Telephone Encounter (Signed)
Patient notified and RX sent to pharmacy.

## 2019-12-21 NOTE — Telephone Encounter (Signed)
-----   Message from Nori Riis, PA-C sent at 12/21/2019  7:53 AM EST ----- Please let Mrs. Noblitt that her urine culture has returned positive for infection and we need to switch her antibiotic to Macrobid 100 mg, BID x 7 days.

## 2019-12-21 NOTE — Telephone Encounter (Signed)
-----   Message from Nori Riis, PA-C sent at 12/21/2019  7:53 AM EST ----- Please let [name] that her urine culture has returned positive for infection and we need to switch her antibiotic to Macrobid 100 mg, BID x 7 days.

## 2019-12-22 LAB — NOVEL CORONAVIRUS, NAA: SARS-CoV-2, NAA: NOT DETECTED

## 2019-12-28 ENCOUNTER — Other Ambulatory Visit: Payer: Self-pay

## 2019-12-28 DIAGNOSIS — B952 Enterococcus as the cause of diseases classified elsewhere: Secondary | ICD-10-CM

## 2019-12-28 DIAGNOSIS — N390 Urinary tract infection, site not specified: Secondary | ICD-10-CM

## 2019-12-28 MED ORDER — CIPROFLOXACIN HCL 250 MG PO TABS: 250.0000 mg | ORAL_TABLET | Freq: Two times a day (BID) | ORAL | 0 refills | Status: AC

## 2019-12-28 NOTE — Progress Notes (Signed)
cipr

## 2019-12-31 ENCOUNTER — Other Ambulatory Visit: Payer: Self-pay | Admitting: Internal Medicine

## 2019-12-31 DIAGNOSIS — Z1231 Encounter for screening mammogram for malignant neoplasm of breast: Secondary | ICD-10-CM

## 2020-01-03 ENCOUNTER — Other Ambulatory Visit: Payer: Self-pay | Admitting: Urology

## 2020-01-03 ENCOUNTER — Other Ambulatory Visit: Payer: Self-pay

## 2020-01-03 ENCOUNTER — Ambulatory Visit
Admission: RE | Admit: 2020-01-03 | Discharge: 2020-01-03 | Disposition: A | Discharge status: Home / Self Care | Payer: Medicare Other | Source: Ambulatory Visit | Attending: Urology | Admitting: Urology

## 2020-01-03 DIAGNOSIS — N200 Calculus of kidney: Secondary | ICD-10-CM | POA: Diagnosis not present

## 2020-01-03 DIAGNOSIS — R3129 Other microscopic hematuria: Secondary | ICD-10-CM | POA: Diagnosis present

## 2020-01-24 ENCOUNTER — Other Ambulatory Visit: Payer: Self-pay

## 2020-01-24 ENCOUNTER — Other Ambulatory Visit: Payer: Medicare Other

## 2020-01-24 DIAGNOSIS — R3129 Other microscopic hematuria: Secondary | ICD-10-CM

## 2020-01-24 LAB — MICROSCOPIC EXAMINATION

## 2020-01-24 LAB — URINALYSIS, COMPLETE
Bilirubin, UA: NEGATIVE
Glucose, UA: NEGATIVE
Ketones, UA: NEGATIVE
Nitrite, UA: NEGATIVE
Protein,UA: NEGATIVE
Specific Gravity, UA: 1.01 (ref 1.005–1.030)
Urobilinogen, Ur: 0.2 mg/dL (ref 0.2–1.0)
pH, UA: 6.5 (ref 5.0–7.5)

## 2020-01-25 ENCOUNTER — Telehealth: Payer: Self-pay | Admitting: Family Medicine

## 2020-01-25 NOTE — Telephone Encounter (Signed)
-----   Message from Nori Riis, PA-C sent at 01/25/2020  7:52 AM EST ----- Please let Morgan Dalton know that her urine was clear of microscopic hematuria.  Per Dr. Cherrie Gauze last note, she would like to see Morgan Dalton in 06/2021 for a KUB and cystoscopy.

## 2020-01-25 NOTE — Telephone Encounter (Signed)
Patient notified and cysto scheduled.

## 2020-03-03 ENCOUNTER — Ambulatory Visit
Admission: RE | Admit: 2020-03-03 | Discharge: 2020-03-03 | Disposition: A | Discharge status: Home / Self Care | Payer: Medicare Other | Source: Ambulatory Visit | Attending: Internal Medicine | Admitting: Internal Medicine

## 2020-03-03 DIAGNOSIS — Z1231 Encounter for screening mammogram for malignant neoplasm of breast: Secondary | ICD-10-CM

## 2020-03-12 ENCOUNTER — Other Ambulatory Visit: Payer: Self-pay

## 2020-03-12 ENCOUNTER — Ambulatory Visit (INDEPENDENT_AMBULATORY_CARE_PROVIDER_SITE_OTHER): Payer: Medicare Other | Admitting: Dermatology

## 2020-03-12 DIAGNOSIS — L820 Inflamed seborrheic keratosis: Secondary | ICD-10-CM

## 2020-03-12 DIAGNOSIS — L814 Other melanin hyperpigmentation: Secondary | ICD-10-CM

## 2020-03-12 DIAGNOSIS — Z85828 Personal history of other malignant neoplasm of skin: Secondary | ICD-10-CM

## 2020-03-12 DIAGNOSIS — L821 Other seborrheic keratosis: Secondary | ICD-10-CM | POA: Diagnosis not present

## 2020-03-12 DIAGNOSIS — Z1283 Encounter for screening for malignant neoplasm of skin: Secondary | ICD-10-CM | POA: Diagnosis not present

## 2020-03-12 DIAGNOSIS — D1801 Hemangioma of skin and subcutaneous tissue: Secondary | ICD-10-CM

## 2020-03-12 DIAGNOSIS — L578 Other skin changes due to chronic exposure to nonionizing radiation: Secondary | ICD-10-CM

## 2020-03-12 NOTE — Patient Instructions (Signed)

## 2020-03-12 NOTE — Progress Notes (Signed)
   Follow-Up Visit   Subjective  Morgan Dalton is a 69 y.o. female who presents for the following: Annual Exam (TBSE, hx SCC R temple) and check spots (trunk, irritated by clothes). The patient presents for skin cancer screening.  She has several spots all over the skin and she would like them evaluated. She has several rough irritating symptomatic areas for which she desires treatment.  The following portions of the chart were reviewed this encounter and updated as appropriate:     Review of Systems: No other skin or systemic complaints.  Objective  Well appearing patient in no apparent distress; mood and affect are within normal limits.  A full examination was performed including scalp, head, eyes, ears, nose, lips, neck, chest, axillae, abdomen, back, buttocks, bilateral upper extremities, bilateral lower extremities, hands, feet, fingers, toes, fingernails, and toenails. All findings within normal limits unless otherwise noted below.  Objective  R temple: clear  Objective  forehead x1, R ear helix above earlobe x 1, back x 2, L chest x 1, L inframammary x 12, bil popliteal x 5, L waist x 3, R ingiunal crease x 2 (27): Stuck on waxy paps with erythema   Assessment & Plan    Skin cancer screening performed today. Seborrheic Keratoses - Stuck-on, waxy, tan-brown papules and plaques  - Discussed benign etiology and prognosis. - Observe - Call for any changes Lentigines - Scattered tan macules - Discussed due to sun exposure - Benign, observe - Call for any changes Actinic Damage - diffuse scaly erythematous macules with underlying dyspigmentation - Recommend daily broad spectrum sunscreen SPF 30+ to sun-exposed areas, reapply every 2 hours as needed.  - Call for new or changing lesions. 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  Clear, no evidence of recurrence,  observe  Inflamed seborrheic keratosis (27) forehead x1, R ear helix above earlobe x 1, back x 2, L chest x 1, L inframammary x 12, bil popliteal x 5, L waist x 3, R ingiunal crease x 2  Destruction of lesion - forehead x1, R ear helix above earlobe x 1, back x 2, L chest x 1, L inframammary x 12, bil popliteal x 5, L waist x 3, R ingiunal crease x 2 Complexity: simple   Destruction method: cryotherapy   Informed consent: discussed and consent obtained   Timeout:  patient name, date of birth, surgical site, and procedure verified Lesion destroyed using liquid nitrogen: Yes   Region frozen until ice ball extended beyond lesion: Yes   Outcome: patient tolerated procedure well with no complications   Post-procedure details: wound care instructions given    Return in about 3 months (around 06/11/2020) for recheck ISK R ear helix above earlobe, txt ISKs.   I, Othelia Pulling, RMA, am acting as scribe for Sarina Ser, MD .

## 2020-06-09 ENCOUNTER — Ambulatory Visit (INDEPENDENT_AMBULATORY_CARE_PROVIDER_SITE_OTHER): Payer: Medicare Other | Admitting: Dermatology

## 2020-06-09 ENCOUNTER — Other Ambulatory Visit: Payer: Self-pay

## 2020-06-09 DIAGNOSIS — L578 Other skin changes due to chronic exposure to nonionizing radiation: Secondary | ICD-10-CM

## 2020-06-09 DIAGNOSIS — L820 Inflamed seborrheic keratosis: Secondary | ICD-10-CM | POA: Diagnosis not present

## 2020-06-09 DIAGNOSIS — L821 Other seborrheic keratosis: Secondary | ICD-10-CM

## 2020-06-09 NOTE — Progress Notes (Signed)
   Follow-Up Visit   Subjective  Morgan Dalton is a 69 y.o. female who presents for the following: ISK (Recheck previously treated lesion on the right ear and inframammary areas).  The following portions of the chart were reviewed this encounter and updated as appropriate:  Tobacco  Allergies  Meds  Problems  Med Hx  Surg Hx  Fam Hx     Review of Systems:  No other skin or systemic complaints except as noted in HPI or Assessment and Plan.  Objective  Well appearing patient in no apparent distress; mood and affect are within normal limits.  A focused examination was performed including face, neck, chest and back and the trunk. Relevant physical exam findings are noted in the Assessment and Plan.  Objective  inframammary (31): Erythematous keratotic or waxy stuck-on papule or plaque.   Assessment & Plan    Inflamed seborrheic keratosis (31) inframammary  R ear helix above the earlobe - resolved  Destruction of lesion - inframammary Complexity: simple   Destruction method: cryotherapy   Informed consent: discussed and consent obtained   Timeout:  patient name, date of birth, surgical site, and procedure verified Lesion destroyed using liquid nitrogen: Yes   Region frozen until ice ball extended beyond lesion: Yes   Outcome: patient tolerated procedure well with no complications   Post-procedure details: wound care instructions given     Seborrheic Keratoses - Stuck-on, waxy, tan-brown papules and plaques  - Discussed benign etiology and prognosis. - Observe - Call for any changes  Actinic Damage - diffuse scaly erythematous macules with underlying dyspigmentation - Recommend daily broad spectrum sunscreen SPF 30+ to sun-exposed areas, reapply every 2 hours as needed.  - Call for new or changing lesions.  Return in about 9 months (around 03/09/2021) for TBSE.  Luther Redo, CMA, am acting as scribe for Sarina Ser, MD . Documentation: I have reviewed the  above documentation for accuracy and completeness, and I agree with the above.  Sarina Ser, MD

## 2020-06-22 ENCOUNTER — Encounter: Payer: Self-pay | Admitting: Dermatology

## 2020-07-08 ENCOUNTER — Ambulatory Visit (INDEPENDENT_AMBULATORY_CARE_PROVIDER_SITE_OTHER): Payer: Medicare Other | Admitting: Physician Assistant

## 2020-07-08 ENCOUNTER — Encounter: Payer: Self-pay | Admitting: Physician Assistant

## 2020-07-08 ENCOUNTER — Other Ambulatory Visit: Payer: Self-pay

## 2020-07-08 VITALS — BP 161/85 | HR 89 | Ht 64.0 in | Wt 193.5 lb

## 2020-07-08 DIAGNOSIS — R300 Dysuria: Secondary | ICD-10-CM | POA: Diagnosis not present

## 2020-07-08 LAB — BLADDER SCAN AMB NON-IMAGING

## 2020-07-08 MED ORDER — CIPROFLOXACIN HCL 250 MG PO TABS: 250.0000 mg | ORAL_TABLET | Freq: Two times a day (BID) | ORAL | 0 refills | Status: AC

## 2020-07-08 NOTE — Progress Notes (Signed)
07/08/2020 1:38 PM   Morgan Dalton 1951/10/21 [id number]  CC: Chief Complaint  Patient presents with  . Dysuria    HPI: Morgan Dalton is a 69 y.o. female with PMH right lower pole renal stone and chronic microscopic hematuria on surveillance with routine KUB and cystoscopy who presents today for evaluation of possible UTI. She is an established [facility] patient who last saw Zara Council on 12/18/2019 for the same.  Urine culture at that time grew tetracycline resistant Enterococcus faecalis; she was initially treated with Macrobid but switched to Cipro due to inadequate therapeutic response.  Today she reports a 4-day history of lower abdominal fullness and pressure, urgency, and dysuria.  She denies fever, chills, nausea, vomiting, and gross hematuria.  She has taken Azo for relief of her symptoms, most recently yesterday afternoon.  In-office UA today positive for 1+ ketones, 1+ blood, nitrites, and 2+ leukocyte esterase; urine microscopy with >30 WBCs/HPF and 3-10 RBCs/HPF. PVR 25mL.  PMH: Past Medical History:  Diagnosis Date  . Anxiety   . Cancer [facility])    Uterine Cancer  . GERD (gastroesophageal reflux disease)   . History of kidney stones   . Hx of dysplastic nevus 10/16/2010   Right lower leg. Severe atypia. Excised: 11/25/2010. Margins free  . Hypercholesterolemia   . Hypertension   . Hypothyroidism   . Kidney stone   . Osteopenia   . Squamous cell carcinoma of skin 08/30/2019   Right temple. Well differentiated. Tx: [facility]    Surgical History: Past Surgical History:  Procedure Laterality Date  . ABDOMINAL HYSTERECTOMY    . CATARACT EXTRACTION W/PHACO Right 12/07/2016   Procedure: CATARACT EXTRACTION PHACO AND INTRAOCULAR LENS PLACEMENT (IOC);  Surgeon: Birder Robson, MD;  Location: [facility] ORS;  Service: Ophthalmology;  Laterality: Right;  [place] 42.5 AP% 23.2 CDE 9.87 Fluid pack lot # [id number]  . CATARACT EXTRACTION W/PHACO Left 01/04/2017   Procedure: CATARACT  EXTRACTION PHACO AND INTRAOCULAR LENS PLACEMENT (IOC);  Surgeon: Birder Robson, MD;  Location: [facility] ORS;  Service: Ophthalmology;  Laterality: Left;  [place] 39.2 AP% 23.9 CDE 9.38 Fluid Pack Lot # [id number]  . CHOLECYSTECTOMY    . COLONOSCOPY WITH PROPOFOL N/A 11/07/2018   Procedure: COLONOSCOPY WITH PROPOFOL;  Surgeon: Lollie Sails, MD;  Location: [facility] ENDOSCOPY;  Service: Endoscopy;  Laterality: N/A;  . DILATION AND CURETTAGE OF UTERUS    . EYE SURGERY    . KIDNEY STONE SURGERY    . KNEE ARTHROSCOPY      Home Medications:  Allergies as of 07/08/2020      Reactions   Statins Other (See Comments)   Myalgia       Medication List       Accurate as of July 08, 2020  1:38 PM. If you have any questions, ask your nurse or doctor.        STOP taking these medications   nitrofurantoin (macrocrystal-monohydrate) 100 MG capsule Commonly known as: MACROBID Stopped by: Debroah Loop, PA-C     TAKE these medications   aspirin EC 81 MG tablet Take 81 mg by mouth daily.   cholecalciferol 10 MCG (400 UNIT) Tabs tablet Commonly known as: VITAMIN D3 Take 1,000 Units by mouth daily.   ciprofloxacin 250 MG tablet Commonly known as: CIPRO Take 1 tablet (250 mg total) by mouth 2 (two) times daily for 7 days. Started by: Debroah Loop, PA-C   Co Q-10 100 MG Caps Take 100 mg by mouth daily.   ezetimibe 10  MG tablet Commonly known as: ZETIA Take 10 mg by mouth daily.   Glucosamine-Chondroitin 750-600 MG Tabs Take 1 tablet by mouth daily.   hydrochlorothiazide 25 MG tablet Commonly known as: HYDRODIURIL Take 12.5 mg by mouth daily.   Krill Oil 500 MG Caps Take 500 mg by mouth daily.   levothyroxine 25 MCG tablet Commonly known as: SYNTHROID Take 25 mcg by mouth daily before breakfast.   Multiple Vitamins tablet Take 1 tablet by mouth daily.   pantoprazole 40 MG tablet Commonly known as: PROTONIX Take 40 mg by mouth daily.   rosuvastatin 5 MG  tablet Commonly known as: CRESTOR Take 5 mg by mouth daily.   sulfamethoxazole-trimethoprim 800-160 MG tablet Commonly known as: BACTRIM DS Take 1 tablet by mouth every 12 (twelve) hours.       Allergies:  Allergies  Allergen Reactions  . Statins Other (See Comments)    Myalgia     Family History: Family History  Problem Relation Age of Onset  . Breast cancer Paternal Aunt     Social History:   reports that she has never smoked. She has never used smokeless tobacco. She reports that she does not drink alcohol and does not use drugs.  Physical Exam: BP (!) 161/85 (BP Location: Left Arm, Patient Position: Sitting, Cuff Size: Normal)   Pulse 89   Ht 5\' 4"  (1.626 m)   Wt 193 lb 8 oz (87.8 kg)   BMI 33.21 kg/m   Constitutional:  Alert and oriented, no acute distress, nontoxic appearing HEENT: [place], AT Cardiovascular: No clubbing, cyanosis, or edema Respiratory: Normal respiratory effort, no increased work of breathing Skin: No rashes, bruises or suspicious lesions Neurologic: Grossly intact, no focal deficits, moving all 4 extremities Psychiatric: Normal mood and affect  Laboratory Data: Results for orders placed or performed in visit on 07/08/20  Microscopic Examination   Urine  Result Value Ref Range   WBC, UA >30 (A) 0 - 5 /hpf   RBC 3-10 (A) 0 - 2 /hpf   Epithelial Cells (non renal) 0-10 0 - 10 /hpf   Casts Present (A) None seen /lpf   Cast Type Hyaline casts N/A   Bacteria, UA Few None seen/Few  Urinalysis, Complete  Result Value Ref Range   Specific Gravity, UA 1.010 1.005 - 1.030   pH, UA 5.5 5.0 - 7.5   Color, UA Yellow Yellow   Appearance Ur Cloudy (A) Clear   Leukocytes,UA 2+ (A) Negative   Protein,UA Negative Negative/Trace   Glucose, UA Negative Negative   Ketones, UA 1+ (A) Negative   RBC, UA 1+ (A) Negative   Bilirubin, UA Negative Negative   Urobilinogen, Ur 0.2 0.2 - 1.0 mg/dL   Nitrite, UA Positive (A) Negative   Microscopic Examination See  below:   Bladder Scan (Post Void Residual) in office  Result Value Ref Range   Scan Result 27mL    Assessment & Plan:   1. Dysuria Nitrites possible contaminant due to recent Azo use, UA otherwise notable for pyuria consistent with acute cystitis.  Will start empiric Cipro and send for culture for further evaluation.  Given patient's history of chronic microscopic hematuria, will not pursue follow-up UA after completion of antibiotics to prove resolution of MH today. - Urinalysis, Complete - Bladder Scan (Post Void Residual) in office - CULTURE, URINE COMPREHENSIVE - ciprofloxacin (CIPRO) 250 MG tablet; Take 1 tablet (250 mg total) by mouth 2 (two) times daily for 7 days.  Dispense: 14 tablet; Refill: 0  Return if symptoms worsen or fail to improve.  Debroah Loop, PA-C  [facility] [address] [place], [place] [address] [phone]

## 2020-07-09 LAB — URINALYSIS, COMPLETE
Bilirubin, UA: NEGATIVE
Glucose, UA: NEGATIVE
Nitrite, UA: POSITIVE — AB
Protein,UA: NEGATIVE
Specific Gravity, UA: 1.01 (ref 1.005–1.030)
Urobilinogen, Ur: 0.2 mg/dL (ref 0.2–1.0)
pH, UA: 5.5 (ref 5.0–7.5)

## 2020-07-09 LAB — MICROSCOPIC EXAMINATION: WBC, UA: >30 /hpf — AB (ref 0–5)

## 2020-07-14 LAB — CULTURE, URINE COMPREHENSIVE

## 2020-10-13 ENCOUNTER — Ambulatory Visit: Payer: Medicare Other | Attending: Internal Medicine

## 2020-10-13 DIAGNOSIS — Z23 Encounter for immunization: Secondary | ICD-10-CM

## 2020-10-13 NOTE — Progress Notes (Signed)
   Covid-19 Vaccination Clinic  Name:  MARIESHA VENTURELLA    MRN: [id number] DOB: [birth year]  10/13/2020  Ms. Deremer was observed post Covid-19 immunization for 15 minutes without incident. She was provided with Vaccine Information Sheet and instruction to access the V-Safe system.   Ms. Esteban was instructed to call 911 with any severe reactions post vaccine: Marland Kitchen Difficulty breathing  . Swelling of face and throat  . A fast heartbeat  . A bad rash all over body  . Dizziness and weakness

## 2020-12-11 ENCOUNTER — Other Ambulatory Visit: Payer: Self-pay

## 2020-12-11 ENCOUNTER — Telehealth: Payer: Self-pay | Admitting: Urology

## 2020-12-11 ENCOUNTER — Ambulatory Visit (INDEPENDENT_AMBULATORY_CARE_PROVIDER_SITE_OTHER): Payer: Medicare Other | Admitting: Physician Assistant

## 2020-12-11 VITALS — BP 157/84 | HR 79 | Ht 64.0 in | Wt 199.0 lb

## 2020-12-11 DIAGNOSIS — R300 Dysuria: Secondary | ICD-10-CM | POA: Diagnosis not present

## 2020-12-11 LAB — URINALYSIS, COMPLETE
Bilirubin, UA: NEGATIVE
Glucose, UA: NEGATIVE
Ketones, UA: NEGATIVE
Nitrite, UA: NEGATIVE
Protein,UA: NEGATIVE
Specific Gravity, UA: 1.015 (ref 1.005–1.030)
Urobilinogen, Ur: 0.2 mg/dL (ref 0.2–1.0)
pH, UA: 7 (ref 5.0–7.5)

## 2020-12-11 LAB — MICROSCOPIC EXAMINATION: WBC, UA: >30 /hpf — AB (ref 0–5)

## 2020-12-11 MED ORDER — CIPROFLOXACIN HCL 250 MG PO TABS: 250.0000 mg | ORAL_TABLET | Freq: Two times a day (BID) | ORAL | 0 refills | Status: DC

## 2020-12-11 MED ORDER — AMOXICILLIN-POT CLAVULANATE 875-125 MG PO TABS: 1.0000 | ORAL_TABLET | Freq: Two times a day (BID) | ORAL | 0 refills | Status: AC

## 2020-12-11 NOTE — Telephone Encounter (Signed)
Patient called the office this morning to request a same day appointment for a possible infection.  She is experiencing painful urination, pressure and urgency.  I added her to Sam's schedule for today at 11:30am.

## 2020-12-11 NOTE — Progress Notes (Signed)
12/11/2020 1:27 PM   Morgan Dalton 1951/06/13 [id number]  CC: Chief Complaint  Patient presents with  . Dysuria   HPI: Morgan Dalton is a 69 y.o. female with PMH right lower pole renal stone and chronic microscopic hematuria on surveillance with routine KUB and cystoscopy who presents today for evaluation of possible UTI.  I saw her in clinic most recently July 2021 for the same.  Urine culture at that time grew tetracycline resistant Enterococcus faecalis and I prescribed Cipro 250 mg twice daily x5 days.  Today she reports a 5-day history of lower abdominal pain, pressure, and dysuria relieved with voiding.  She denies fever, chills, nausea, vomiting, flank pain, and gross hematuria.  Notably, she states she initially felt the Cipro helped her most recent infection, however her symptoms returned immediately after completion of these.  Her PCP prescribed another course of Cipro, which again failed to clear her symptoms.  Ultimately, she was treated with a course of amoxicillin, which resolved her symptoms.    In-office UA today positive for 2+ blood and 2+ leukocyte esterase; urine microscopy with >30 WBCs/HPF and 11-30 RBCs/HPF.  PMH: Past Medical History:  Diagnosis Date  . Anxiety   . Cancer [facility])    Uterine Cancer  . GERD (gastroesophageal reflux disease)   . History of kidney stones   . Hx of dysplastic nevus 10/16/2010   Right lower leg. Severe atypia. Excised: 11/25/2010. Margins free  . Hypercholesterolemia   . Hypertension   . Hypothyroidism   . Kidney stone   . Osteopenia   . Squamous cell carcinoma of skin 08/30/2019   Right temple. Well differentiated. Tx: [facility]    Surgical History: Past Surgical History:  Procedure Laterality Date  . ABDOMINAL HYSTERECTOMY    . CATARACT EXTRACTION W/PHACO Right 12/07/2016   Procedure: CATARACT EXTRACTION PHACO AND INTRAOCULAR LENS PLACEMENT (IOC);  Surgeon: Birder Robson, MD;  Location: [facility] ORS;  Service: Ophthalmology;   Laterality: Right;  [place] 42.5 AP% 23.2 CDE 9.87 Fluid pack lot # [id number]  . CATARACT EXTRACTION W/PHACO Left 01/04/2017   Procedure: CATARACT EXTRACTION PHACO AND INTRAOCULAR LENS PLACEMENT (IOC);  Surgeon: Birder Robson, MD;  Location: [facility] ORS;  Service: Ophthalmology;  Laterality: Left;  [place] 39.2 AP% 23.9 CDE 9.38 Fluid Pack Lot # [id number]  . CHOLECYSTECTOMY    . COLONOSCOPY WITH PROPOFOL N/A 11/07/2018   Procedure: COLONOSCOPY WITH PROPOFOL;  Surgeon: Lollie Sails, MD;  Location: [facility] ENDOSCOPY;  Service: Endoscopy;  Laterality: N/A;  . DILATION AND CURETTAGE OF UTERUS    . EYE SURGERY    . KIDNEY STONE SURGERY    . KNEE ARTHROSCOPY      Home Medications:  Allergies as of 12/11/2020      Reactions   Statins Other (See Comments)   Myalgia       Medication List       Accurate as of December 11, 2020  1:27 PM. If you have any questions, ask your nurse or doctor.        STOP taking these medications   sulfamethoxazole-trimethoprim 800-160 MG tablet Commonly known as: BACTRIM DS Stopped by: Debroah Loop, PA-C     TAKE these medications   amoxicillin-clavulanate 875-125 MG tablet Commonly known as: AUGMENTIN Take 1 tablet by mouth 2 (two) times daily for 7 days. Started by: Debroah Loop, PA-C   ascorbic Acid 500 MG Cpcr Commonly known as: VITAMIN C Take 500 mg by mouth daily.   aspirin EC 81 MG  tablet Take 81 mg by mouth daily.   cholecalciferol 10 MCG (400 UNIT) Tabs tablet Commonly known as: VITAMIN D3 Take 1,000 Units by mouth daily.   Co Q-10 100 MG Caps Take 100 mg by mouth daily.   Cranberry 500 MG Tabs Take by mouth.   ezetimibe 10 MG tablet Commonly known as: ZETIA Take 10 mg by mouth daily.   Glucosamine-Chondroitin 750-600 MG Tabs Take 1 tablet by mouth daily.   hydrochlorothiazide 25 MG tablet Commonly known as: HYDRODIURIL Take 12.5 mg by mouth daily.   Krill Oil 500 MG Caps Take 1,000 mg by mouth daily.    levothyroxine 25 MCG tablet Commonly known as: SYNTHROID Take 25 mcg by mouth daily before breakfast.   Multiple Vitamins tablet Take 1 tablet by mouth daily.   pantoprazole 40 MG tablet Commonly known as: PROTONIX Take 40 mg by mouth daily.   rosuvastatin 5 MG tablet Commonly known as: CRESTOR Take 5 mg by mouth daily.       Allergies:  Allergies  Allergen Reactions  . Statins Other (See Comments)    Myalgia     Family History: Family History  Problem Relation Age of Onset  . Breast cancer Paternal Aunt     Social History:   reports that she has never smoked. She has never used smokeless tobacco. She reports that she does not drink alcohol and does not use drugs.  Physical Exam: BP (!) 157/84 (BP Location: Left Arm, Patient Position: Sitting, Cuff Size: Large)   Pulse 79   Ht 5\' 4"  (1.626 m)   Wt 199 lb (90.3 kg)   BMI 34.16 kg/m   Constitutional:  Alert and oriented, no acute distress, nontoxic appearing HEENT: [place], AT Cardiovascular: No clubbing, cyanosis, or edema Respiratory: Normal respiratory effort, no increased work of breathing Skin: No rashes, bruises or suspicious lesions Neurologic: Grossly intact, no focal deficits, moving all 4 extremities Psychiatric: Normal mood and affect  Laboratory Data: Results for orders placed or performed in visit on 12/11/20  Microscopic Examination   Urine  Result Value Ref Range   WBC, UA >30 (A) 0 - 5 /hpf   RBC 11-30 (A) 0 - 2 /hpf   Epithelial Cells (non renal) 0-10 0 - 10 /hpf   Bacteria, UA Few None seen/Few  Urinalysis, Complete  Result Value Ref Range   Specific Gravity, UA 1.015 1.005 - 1.030   pH, UA 7.0 5.0 - 7.5   Color, UA Yellow Yellow   Appearance Ur Cloudy (A) Clear   Leukocytes,UA 2+ (A) Negative   Protein,UA Negative Negative/Trace   Glucose, UA Negative Negative   Ketones, UA Negative Negative   RBC, UA 2+ (A) Negative   Bilirubin, UA Negative Negative   Urobilinogen, Ur 0.2 0.2 - 1.0  mg/dL   Nitrite, UA Negative Negative   Microscopic Examination See below:    Assessment & Plan:   1. Dysuria 5-day history of dysuria and other irritative symptoms in the setting of pyuria, microscopic hematuria at baseline.  Will treat with empiric Augmentin and send for culture for further evaluation.  Will defer repeat UA given her history of microscopic hematuria, as I do not expect this to clear with antibiotics. - Urinalysis, Complete - CULTURE, URINE COMPREHENSIVE - amoxicillin-clavulanate (AUGMENTIN) 875-125 MG tablet; Take 1 tablet by mouth 2 (two) times daily for 7 days.  Dispense: 14 tablet; Refill: 0  Return if symptoms worsen or fail to improve.  12/13/20, PA-C  [facility]  [address] [place], [place] [address] [phone]

## 2020-12-18 LAB — CULTURE, URINE COMPREHENSIVE

## 2021-01-15 ENCOUNTER — Other Ambulatory Visit: Payer: Self-pay | Admitting: Obstetrics and Gynecology

## 2021-01-15 DIAGNOSIS — Z1231 Encounter for screening mammogram for malignant neoplasm of breast: Secondary | ICD-10-CM

## 2021-02-22 IMAGING — CT CT RENAL STONE PROTOCOL
2 of 4 series · 16 of 46 positions shown, 18 images · non-contrast
Comparison: 08/12/2016

CLINICAL DATA: Right flank pain for 6 weeks. History kidney stones.
Uterine cancer. Lithotripsy and prior hysterectomy.

EXAM:
CT ABDOMEN AND PELVIS WITHOUT CONTRAST
TECHNIQUE: Multidetector CT imaging of the abdomen and pelvis was performed
following the standard protocol without IV contrast.

[Series 2: renal stone · axial · 0.75mm/px · z∈[-1543,-1128]mm · 13 of 91 slices shown, 15 images]
[im 4/91  soft-tissue]
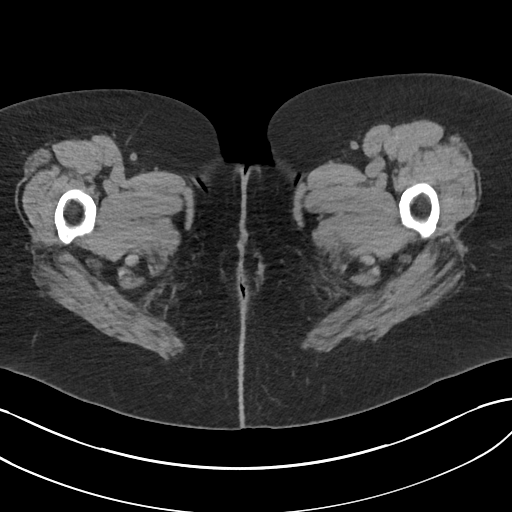
[im 4/91  bone]
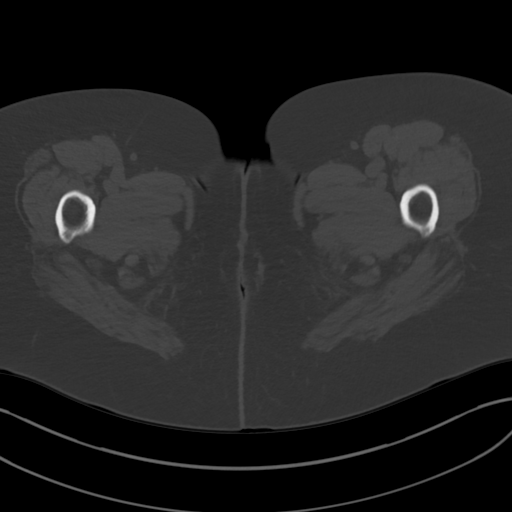
[im 11/91  soft-tissue]
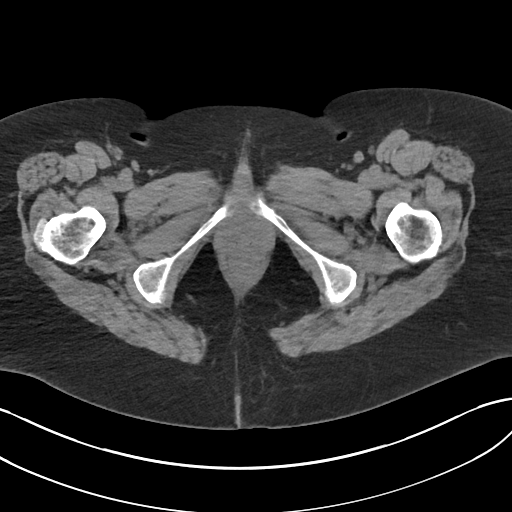
[im 19/91  soft-tissue]
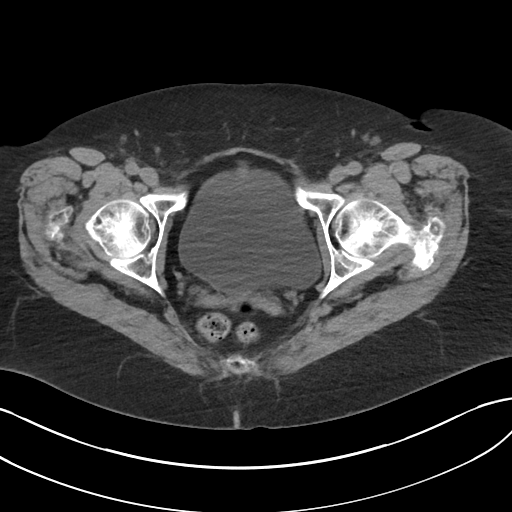
[im 26/91  soft-tissue]
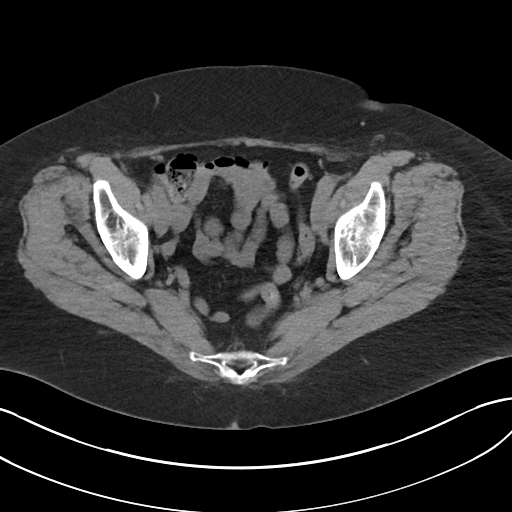
[im 33/91  soft-tissue]
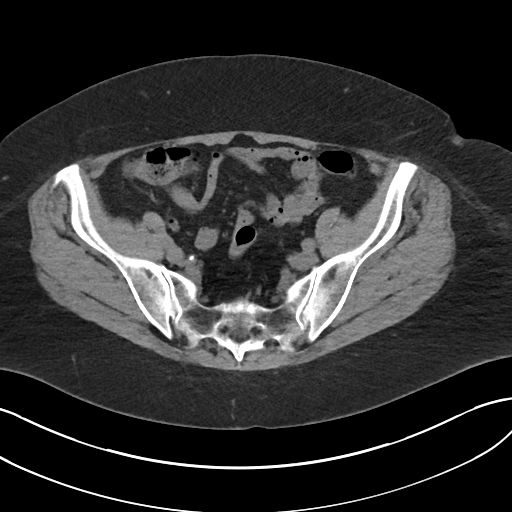
[im 40/91  soft-tissue]
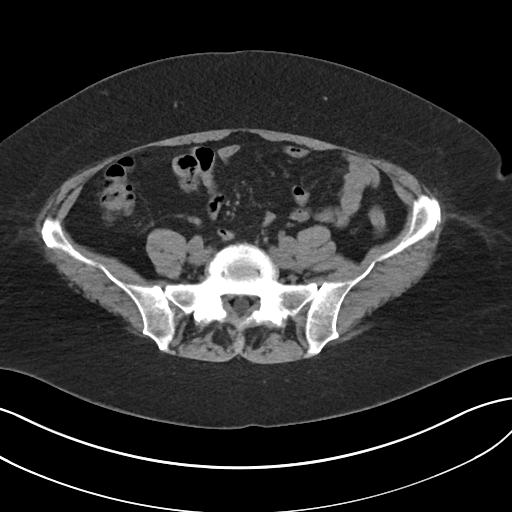
[im 47/91  soft-tissue]
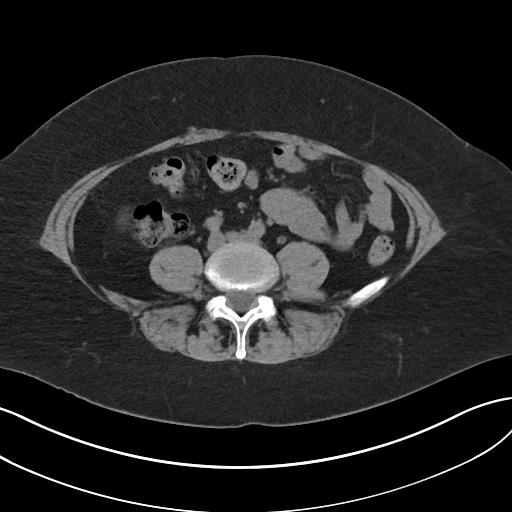
[im 51/91  soft-tissue]
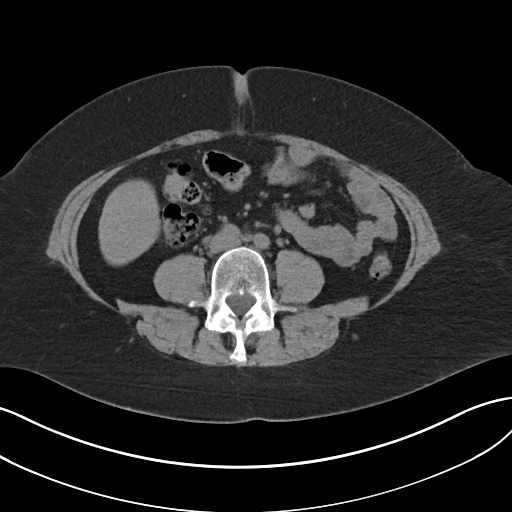
[im 58/91  soft-tissue]
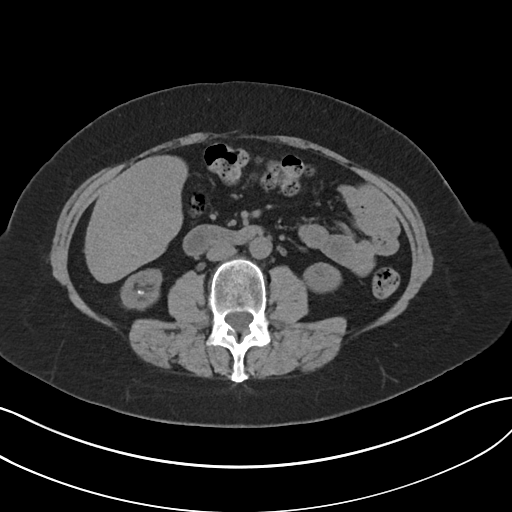
[im 58/91  bone]
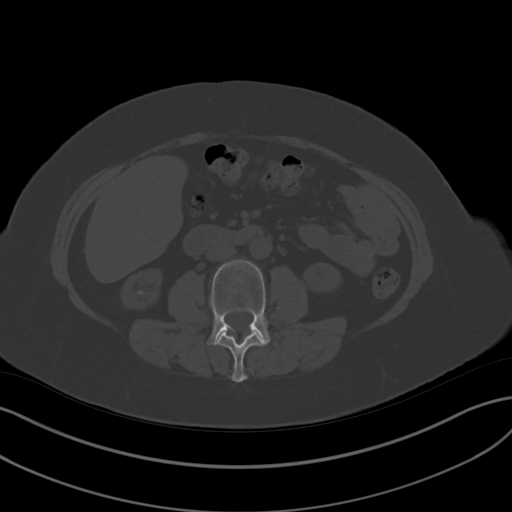
[im 65/91  soft-tissue]
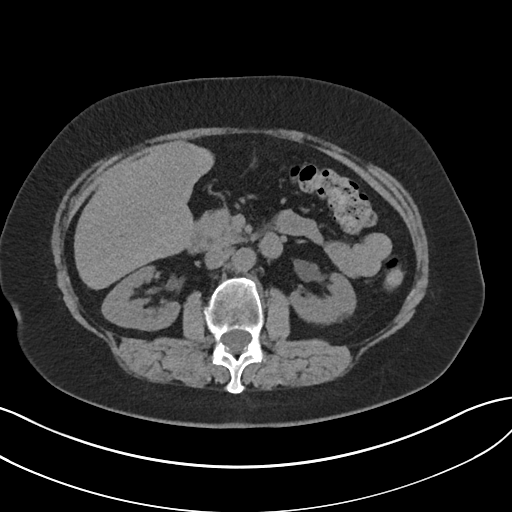
[im 73/91  soft-tissue]
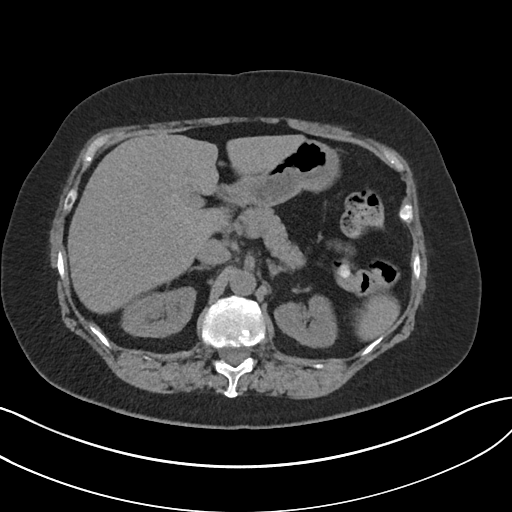
[im 80/91  soft-tissue]
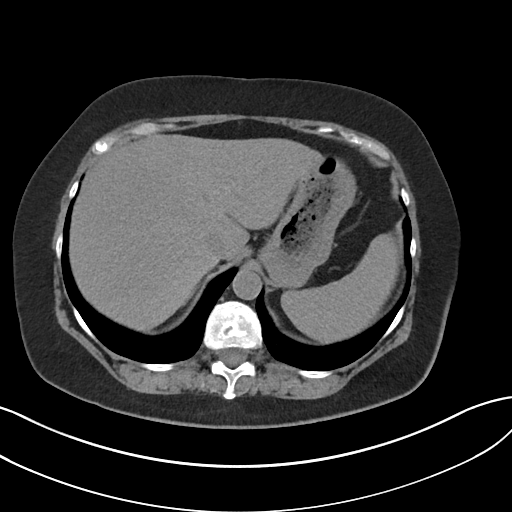
[im 87/91  soft-tissue]
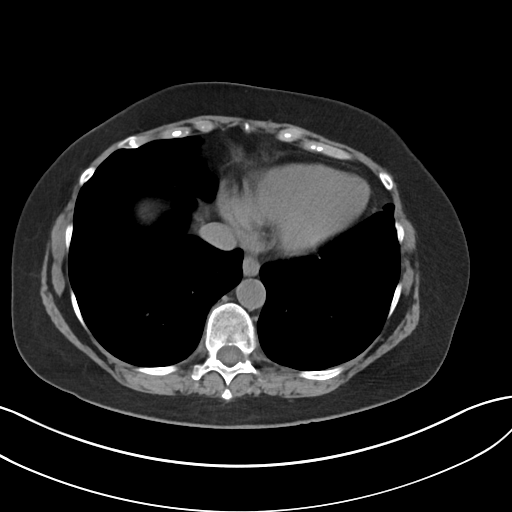

[Series 4: coronal renal stone · coronal · 0.75mm/px · 3 of 145 slices shown]
[im 49/145  soft-tissue]
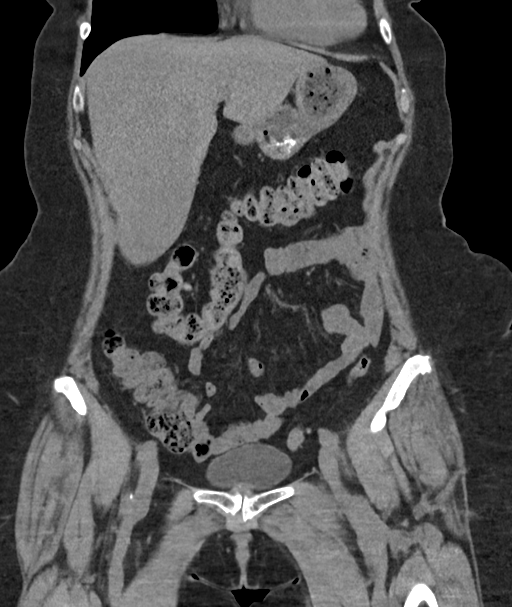
[im 65/145  soft-tissue]
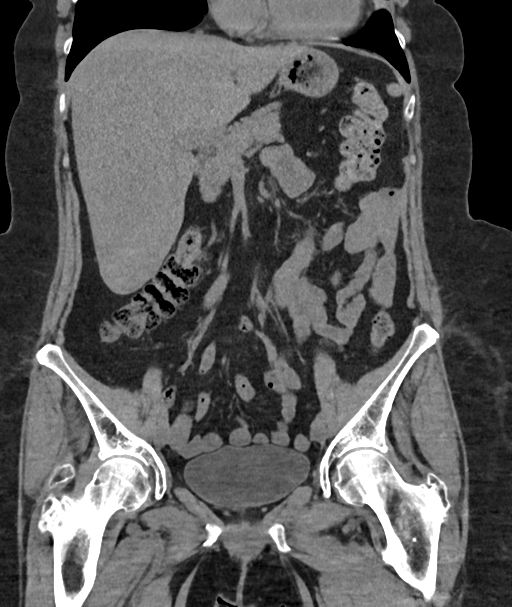
[im 81/145  soft-tissue]
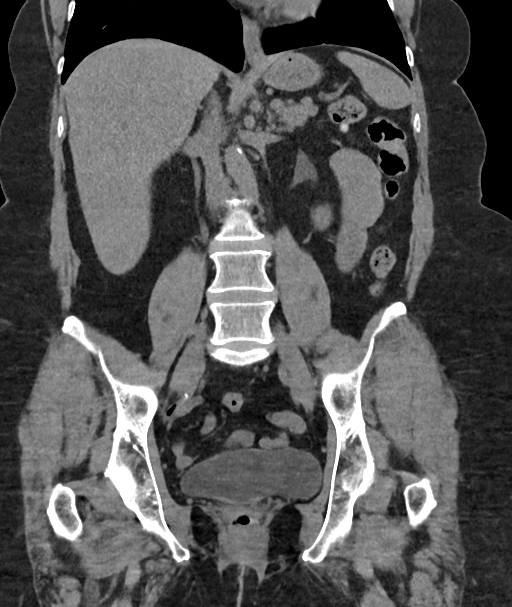

[16 of 46 positions shown; findings below may reference images not displayed]

FINDINGS: Lower chest: Calcified granuloma in the left lower lobe. Normal
heart size without pericardial or pleural effusion.

Hepatobiliary: Moderate hepatic steatosis. Mild hepatomegaly at
cm. Cholecystectomy, without biliary ductal dilatation.

Pancreas: Normal, without mass or ductal dilatation.

Spleen: Normal in size, without focal abnormality.

Adrenals/Urinary Tract: Normal adrenal glands. Mild renal cortical
thinning bilaterally. Similar appearance of a partial staghorn type
calculus involving the inter and lower pole right renal collecting
system with extension into the right renal pelvis. Maximally on the
order of 1.9 cm on [DATE]. 2.9 cm on coronal image 91. No
hydronephrosis. No hydroureter or ureteric calculi. No bladder
calculi.

Stomach/Bowel: Normal stomach, without wall thickening. Scattered
colonic diverticula. Colonic stool burden suggests constipation.
Normal terminal ileum and appendix. Normal small bowel.

Vascular/Lymphatic: Aortic atherosclerosis. Small retroperitoneal
nodes are similar and not pathologic by size criteria. No pelvic
sidewall adenopathy.

Reproductive: Hysterectomy.  No adnexal mass.

Other: No significant free fluid. Mild pelvic floor laxity.

Musculoskeletal: Osteopenia.
IMPRESSION: 1. Similar appearance of a partial staghorn type right renal
calculus. No obstructive uropathy.
2. Possible constipation.
3. Hepatic steatosis and hepatomegaly.
4. Pelvic floor laxity.

Aortic Atherosclerosis (I36HL-AED.D).

## 2021-02-26 ENCOUNTER — Encounter: Payer: Medicare Other | Admitting: Dermatology

## 2021-03-05 ENCOUNTER — Other Ambulatory Visit: Payer: Self-pay

## 2021-03-05 ENCOUNTER — Ambulatory Visit
Admission: RE | Admit: 2021-03-05 | Discharge: 2021-03-05 | Disposition: A | Discharge status: Home / Self Care | Payer: Medicare Other | Source: Ambulatory Visit | Attending: Obstetrics and Gynecology | Admitting: Obstetrics and Gynecology

## 2021-03-05 DIAGNOSIS — Z1231 Encounter for screening mammogram for malignant neoplasm of breast: Secondary | ICD-10-CM | POA: Insufficient documentation

## 2021-04-03 ENCOUNTER — Ambulatory Visit: Payer: Medicare Other | Attending: Internal Medicine

## 2021-04-03 DIAGNOSIS — Z23 Encounter for immunization: Secondary | ICD-10-CM

## 2021-04-03 NOTE — Progress Notes (Signed)
   Covid-19 Vaccination Clinic  Name:  Morgan Dalton    MRN: [id number] DOB: Apr 25, 1951  04/03/2021  Ms. Riding was observed post Covid-19 immunization for 15 minutes without incident. She was provided with Vaccine Information Sheet and instruction to access the V-Safe system.   [name] was instructed to call 911 with any severe reactions post vaccine: Marland Kitchen Difficulty breathing  . Swelling of face and throat  . A fast heartbeat  . A bad rash all over body  . Dizziness and weakness   Immunizations Administered    Name Date Dose VIS Date Route   Moderna Covid-19 Booster Vaccine 04/03/2021 11:23 AM 0.25 mL 10/01/2020 Intramuscular   Manufacturer: Moderna   Lot: [id number]   [place]: 54982-641-58

## 2021-04-07 ENCOUNTER — Other Ambulatory Visit: Payer: Self-pay

## 2021-04-07 MED ORDER — MODERNA COVID-19 VACCINE 100 MCG/0.5ML IM SUSP
INTRAMUSCULAR | 0 refills | Status: DC
  Filled 2021-04-07: qty 0.25, 1d supply, fill #0

## 2021-05-14 ENCOUNTER — Ambulatory Visit (INDEPENDENT_AMBULATORY_CARE_PROVIDER_SITE_OTHER): Payer: Medicare Other | Admitting: Dermatology

## 2021-05-14 ENCOUNTER — Other Ambulatory Visit: Payer: Self-pay

## 2021-05-14 ENCOUNTER — Encounter: Payer: Self-pay | Admitting: Dermatology

## 2021-05-14 DIAGNOSIS — D229 Melanocytic nevi, unspecified: Secondary | ICD-10-CM

## 2021-05-14 DIAGNOSIS — Z85828 Personal history of other malignant neoplasm of skin: Secondary | ICD-10-CM

## 2021-05-14 DIAGNOSIS — Z86018 Personal history of other benign neoplasm: Secondary | ICD-10-CM | POA: Diagnosis not present

## 2021-05-14 DIAGNOSIS — L820 Inflamed seborrheic keratosis: Secondary | ICD-10-CM | POA: Diagnosis not present

## 2021-05-14 DIAGNOSIS — Z1283 Encounter for screening for malignant neoplasm of skin: Secondary | ICD-10-CM | POA: Diagnosis not present

## 2021-05-14 DIAGNOSIS — L570 Actinic keratosis: Secondary | ICD-10-CM | POA: Diagnosis not present

## 2021-05-14 DIAGNOSIS — L578 Other skin changes due to chronic exposure to nonionizing radiation: Secondary | ICD-10-CM

## 2021-05-14 DIAGNOSIS — L821 Other seborrheic keratosis: Secondary | ICD-10-CM

## 2021-05-14 DIAGNOSIS — D1800 Hemangioma unspecified site: Secondary | ICD-10-CM

## 2021-05-14 DIAGNOSIS — L814 Other melanin hyperpigmentation: Secondary | ICD-10-CM

## 2021-05-14 NOTE — Progress Notes (Signed)
Follow-Up Visit   Subjective  Morgan Dalton is a 70 y.o. female who presents for the following: Annual Exam. Hx SCC, AK's, dysplastic nevus. Patient has a scaly lesions on the face that she's concerned about. The patient presents for Total-Body Skin Exam (TBSE) for skin cancer screening and mole check.  The following portions of the chart were reviewed this encounter and updated as appropriate:   Tobacco  Allergies  Meds  Problems  Med Hx  Surg Hx  Fam Hx     Review of Systems:  No other skin or systemic complaints except as noted in HPI or Assessment and Plan.  Objective  Well appearing patient in no apparent distress; mood and affect are within normal limits.  A full examination was performed including scalp, head, eyes, ears, nose, lips, neck, chest, axillae, abdomen, back, buttocks, bilateral upper extremities, bilateral lower extremities, hands, feet, fingers, toes, fingernails, and toenails. All findings within normal limits unless otherwise noted below.  Objective  L leg x 2, L side x 15, B/L thigh x 5 (22): Erythematous keratotic or waxy stuck-on papule or plaque.   Objective  Face x 3 (3): Erythematous thin papules/macules with gritty scale.   Assessment & Plan  Inflamed seborrheic keratosis (22) L leg x 2, L side x 15, B/L thigh x 5  Destruction of lesion - L leg x 2, L side x 15, B/L thigh x 5 Complexity: simple   Destruction method: cryotherapy   Informed consent: discussed and consent obtained   Timeout:  patient name, date of birth, surgical site, and procedure verified Lesion destroyed using liquid nitrogen: Yes   Region frozen until ice ball extended beyond lesion: Yes   Outcome: patient tolerated procedure well with no complications   Post-procedure details: wound care instructions given    AK (actinic keratosis) (3) Face x 3  Destruction of lesion - Face x 3 Complexity: simple   Destruction method: cryotherapy   Informed consent: discussed and  consent obtained   Timeout:  patient name, date of birth, surgical site, and procedure verified Lesion destroyed using liquid nitrogen: Yes   Region frozen until ice ball extended beyond lesion: Yes   Outcome: patient tolerated procedure well with no complications   Post-procedure details: wound care instructions given     Lentigines - Scattered tan macules - Due to sun exposure - Benign-appering, observe - Recommend daily broad spectrum sunscreen SPF 30+ to sun-exposed areas, reapply every 2 hours as needed. - Call for any changes  Seborrheic Keratoses - Stuck-on, waxy, tan-brown papules and/or plaques  - Benign-appearing - Discussed benign etiology and prognosis. - Observe - Call for any changes  Melanocytic Nevi - Tan-brown and/or pink-flesh-colored symmetric macules and papules - Benign appearing on exam today - Observation - Call clinic for new or changing moles - Recommend daily use of broad spectrum spf 30+ sunscreen to sun-exposed areas.   Hemangiomas - Red papules - Discussed benign nature - Observe - Call for any changes  History of Squamous Cell Carcinoma of the Skin - No evidence of recurrence today - No lymphadenopathy - Recommend regular full body skin exams - Recommend daily broad spectrum sunscreen SPF 30+ to sun-exposed areas, reapply every 2 hours as needed.  - Call if any new or changing lesions are noted between office visits  History of Dysplastic Nevus - No evidence of recurrence today - Recommend regular full body skin exams - Recommend daily broad spectrum sunscreen SPF 30+ to sun-exposed areas, reapply  every 2 hours as needed.  - Call if any new or changing lesions are noted between office visits  Actinic Damage - Severe, confluent actinic changes with pre-cancerous actinic keratoses  - Severe, chronic, not at goal, secondary to cumulative UV radiation exposure over time - diffuse scaly erythematous macules and papules with underlying  dyspigmentation - Discussed Prescription "Field Treatment" for Severe, Chronic Confluent Actinic Changes with Pre-Cancerous Actinic Keratoses Field treatment involves treatment of an entire area of skin that has confluent Actinic Changes (Sun/ Ultraviolet light damage) and PreCancerous Actinic Keratoses by method of PhotoDynamic Therapy (PDT) and/or prescription Topical Chemotherapy agents such as 5-fluorouracil, 5-fluorouracil/calcipotriene, and/or imiquimod.  The purpose is to decrease the number of clinically evident and subclinical PreCancerous lesions to prevent progression to development of skin cancer by chemically destroying early precancer changes that may or may not be visible.  It has been shown to reduce the risk of developing skin cancer in the treated area. As a result of treatment, redness, scaling, crusting, and open sores may occur during treatment course. One or more than one of these methods may be used and may have to be used several times to control, suppress and eliminate the PreCancerous changes. Discussed treatment course, expected reaction, and possible side effects. - Recommend daily broad spectrum sunscreen SPF 30+ to sun-exposed areas, reapply every 2 hours as needed.  - Staying in the shade or wearing long sleeves, sun glasses (UVA+UVB protection) and wide brim hats (4-inch brim around the entire circumference of the hat) are also recommended. - Call for new or changing lesions. - Start 5FU/Calcipotriene mix BID x 1 week to the forehead, chin, cheeks, and nose.   Skin cancer screening performed today.  Return in about 3 months (around 08/14/2021) for AK follow S/P 5FU/Calcipotriene mix.  Luther Redo, CMA, am acting as scribe for Sarina Ser, MD .  Documentation: I have reviewed the above documentation for accuracy and completeness, and I agree with the above.  Sarina Ser, MD

## 2021-05-14 NOTE — Patient Instructions (Signed)

## 2021-05-15 ENCOUNTER — Encounter: Payer: Self-pay | Admitting: Dermatology

## 2021-06-10 ENCOUNTER — Ambulatory Visit: Payer: Medicare Other | Admitting: Dermatology

## 2021-06-22 ENCOUNTER — Ambulatory Visit: Payer: Medicare Other | Admitting: Dermatology

## 2021-07-06 ENCOUNTER — Ambulatory Visit
Admission: RE | Admit: 2021-07-06 | Discharge: 2021-07-06 | Disposition: A | Discharge status: Home / Self Care | Payer: Medicare Other | Attending: Urology | Admitting: Urology

## 2021-07-06 ENCOUNTER — Ambulatory Visit
Admission: RE | Admit: 2021-07-06 | Discharge: 2021-07-06 | Disposition: A | Discharge status: Home / Self Care | Payer: Medicare Other | Source: Ambulatory Visit | Attending: Urology | Admitting: Urology

## 2021-07-06 ENCOUNTER — Other Ambulatory Visit: Payer: Self-pay | Admitting: Family Medicine

## 2021-07-06 DIAGNOSIS — N200 Calculus of kidney: Secondary | ICD-10-CM

## 2021-07-06 NOTE — Progress Notes (Signed)
   07/10/21  CC:  Chief Complaint  Patient presents with   Hematuria    HPI: 70 year old female with a personal history of persistent microscopic hematuria, partial staghorn renal calculus and recurrent UTIs who presents today for cystoscopic evaluation.  Her last cystoscopy was 2 years ago.  Today, she reports that she has been doing fine.  She has been having a hard time recovering from [place] but otherwise has no urinary complaints today.  KUB shows minimally enlarged chronic staghorn from which she is asymptomatic.  NED. A&Ox3.   No respiratory distress   Abd soft, NT, ND Normal external genitalia with patent urethral meatus  Cystoscopy Procedure Note  Patient identification was confirmed, informed consent was obtained, and patient was prepped using Betadine solution.  Lidocaine jelly was administered per urethral meatus.    Procedure: - Flexible cystoscope introduced, without any difficulty.   - Thorough search of the bladder revealed:    normal urethral meatus    normal urothelium    no stones    no ulcers     no tumors    no urethral polyps    no trabeculation  - Ureteral orifices were normal in position and appearance.  Post-Procedure: - Patient tolerated the procedure well  Assessment/ Plan:  1. Microscopic hematuria Cystoscopy today is reassuring, source of microscopic hematuria likely secondary to staghorn calculus  Would recommend continuing cystoscopy on a every 2 to 3-year basis in the setting of persistent microscopic hematuria to ensure that she has not developed any bladder pathology  If she has any episodes of gross hematuria in the absence of infection, would recommend cystoscopy and upper tract imaging sooner - Urinalysis, Complete  2. 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 Recurrent UTI Occasional UTIs, may be related to stone versus postmenopausal state  Currently using cranberry tablets, encouraged this and may benefit from topical estrogen cream if she develops increased frequency of infections.   Follow-up in 1 year for symptom recheck/UA  Hollice Espy, MD

## 2021-07-07 ENCOUNTER — Ambulatory Visit (INDEPENDENT_AMBULATORY_CARE_PROVIDER_SITE_OTHER): Payer: Medicare Other | Admitting: Urology

## 2021-07-07 ENCOUNTER — Encounter: Payer: Self-pay | Admitting: Urology

## 2021-07-07 ENCOUNTER — Other Ambulatory Visit: Payer: Self-pay

## 2021-07-07 VITALS — BP 166/87 | HR 87 | Ht 64.0 in | Wt 199.0 lb

## 2021-07-07 DIAGNOSIS — N390 Urinary tract infection, site not specified: Secondary | ICD-10-CM

## 2021-07-07 DIAGNOSIS — N200 Calculus of kidney: Secondary | ICD-10-CM

## 2021-07-07 DIAGNOSIS — R3129 Other microscopic hematuria: Secondary | ICD-10-CM

## 2021-07-09 LAB — MICROSCOPIC EXAMINATION: WBC, UA: >30 /hpf — AB (ref 0–5)

## 2021-07-09 LAB — URINALYSIS, COMPLETE
Bilirubin, UA: NEGATIVE
Glucose, UA: NEGATIVE
Ketones, UA: NEGATIVE
Nitrite, UA: NEGATIVE
Specific Gravity, UA: 1.025 (ref 1.005–1.030)
Urobilinogen, Ur: 0.2 mg/dL (ref 0.2–1.0)
pH, UA: 6 (ref 5.0–7.5)

## 2021-09-10 ENCOUNTER — Ambulatory Visit (INDEPENDENT_AMBULATORY_CARE_PROVIDER_SITE_OTHER): Payer: Medicare Other | Admitting: Dermatology

## 2021-09-10 ENCOUNTER — Other Ambulatory Visit: Payer: Self-pay

## 2021-09-10 DIAGNOSIS — L821 Other seborrheic keratosis: Secondary | ICD-10-CM | POA: Diagnosis not present

## 2021-09-10 DIAGNOSIS — L820 Inflamed seborrheic keratosis: Secondary | ICD-10-CM

## 2021-09-10 DIAGNOSIS — L578 Other skin changes due to chronic exposure to nonionizing radiation: Secondary | ICD-10-CM

## 2021-09-10 DIAGNOSIS — L570 Actinic keratosis: Secondary | ICD-10-CM | POA: Diagnosis not present

## 2021-09-10 NOTE — Patient Instructions (Addendum)

## 2021-09-10 NOTE — Progress Notes (Signed)
   Follow-Up Visit   Subjective  Morgan Dalton is a 70 y.o. female who presents for the following: Actinic Keratosis (3 months f/u on her face post efudex cream treatment ).  The following portions of the chart were reviewed this encounter and updated as appropriate:   Tobacco  Allergies  Meds  Problems  Med Hx  Surg Hx  Fam Hx     Review of Systems:  No other skin or systemic complaints except as noted in HPI or Assessment and Plan.  Objective  Well appearing patient in no apparent distress; mood and affect are within normal limits.  A focused examination was performed including face, right thigh. Relevant physical exam findings are noted in the Assessment and Plan.  Left Ear Erythematous thin papules/macules with gritty scale.   Right Thigh - Anterior, left dorsum hand  (2) Erythematous keratotic or waxy stuck-on papule or plaque.   Assessment & Plan  AK (actinic keratosis) Left Ear  Destruction of lesion - Left Ear Complexity: simple   Destruction method: cryotherapy   Informed consent: discussed and consent obtained   Timeout:  patient name, date of birth, surgical site, and procedure verified Lesion destroyed using liquid nitrogen: Yes   Region frozen until ice ball extended beyond lesion: Yes   Outcome: patient tolerated procedure well with no complications   Post-procedure details: wound care instructions given    Inflamed seborrheic keratosis Right Thigh - Anterior, left dorsum hand  (2)  Destruction of lesion - Right Thigh - Anterior, left dorsum hand  (2) Complexity: simple   Destruction method: cryotherapy   Informed consent: discussed and consent obtained   Timeout:  patient name, date of birth, surgical site, and procedure verified Lesion destroyed using liquid nitrogen: Yes   Region frozen until ice ball extended beyond lesion: Yes   Outcome: patient tolerated procedure well with no complications   Post-procedure details: wound care instructions  given     Actinic Damage - chronic, secondary to cumulative UV radiation exposure/sun exposure over time - diffuse scaly erythematous macules with underlying dyspigmentation - Recommend daily broad spectrum sunscreen SPF 30+ to sun-exposed areas, reapply every 2 hours as needed.  - Recommend staying in the shade or wearing long sleeves, sun glasses (UVA+UVB protection) and wide brim hats (4-inch brim around the entire circumference of the hat). - Call for new or changing lesions.   Seborrheic Keratoses - Stuck-on, waxy, tan-brown papules and/or plaques  - Benign-appearing - Discussed benign etiology and prognosis. - Observe - Call for any changes  Return in about 1 year (around 09/10/2022) for TBSE, hx of AKs .  IMarye Round, CMA, am acting as scribe for Sarina Ser, MD .  Documentation: I have reviewed the above documentation for accuracy and completeness, and I agree with the above.  Sarina Ser, MD

## 2021-09-11 ENCOUNTER — Encounter: Payer: Self-pay | Admitting: Dermatology

## 2021-10-23 ENCOUNTER — Other Ambulatory Visit: Payer: Self-pay

## 2021-10-23 ENCOUNTER — Ambulatory Visit: Payer: Medicare Other | Attending: Internal Medicine

## 2021-10-23 DIAGNOSIS — Z23 Encounter for immunization: Secondary | ICD-10-CM

## 2021-10-23 MED ORDER — MODERNA COVID-19 BIVAL BOOSTER 50 MCG/0.5ML IM SUSP
INTRAMUSCULAR | 0 refills | Status: DC
  Filled 2021-10-23: qty 0.5, 1d supply, fill #0

## 2021-10-23 NOTE — Progress Notes (Signed)
   Covid-19 Vaccination Clinic  Name:  SIRENA RIDDLE    MRN: [id number] DOB: November 18, 1951  10/23/2021  Ms. Welford was observed post Covid-19 immunization for 15 minutes without incident. She was provided with Vaccine Information Sheet and instruction to access the V-Safe system.   Ms. Vandekamp was instructed to call 911 with any severe reactions post vaccine: Difficulty breathing  Swelling of face and throat  A fast heartbeat  A bad rash all over body  Dizziness and weakness   Immunizations Administered     Name Date Dose VIS Date Route   Moderna Covid-19 vaccine Bivalent Booster 10/23/2021  9:48 AM 0.5 mL 07/25/2021 Intramuscular   Manufacturer: Levan Hurst   Lot: [id number]   NDC: 41282-081-38      Lu Duffel, PharmD, MBA Clinical Acute Care Pharmacist

## 2022-01-19 ENCOUNTER — Other Ambulatory Visit: Payer: Self-pay | Admitting: Obstetrics and Gynecology

## 2022-01-19 DIAGNOSIS — Z1231 Encounter for screening mammogram for malignant neoplasm of breast: Secondary | ICD-10-CM

## 2022-03-09 ENCOUNTER — Ambulatory Visit
Admission: RE | Admit: 2022-03-09 | Discharge: 2022-03-09 | Disposition: A | Discharge status: Home / Self Care | Payer: Medicare Other | Source: Ambulatory Visit | Attending: Obstetrics and Gynecology | Admitting: Obstetrics and Gynecology

## 2022-03-09 ENCOUNTER — Other Ambulatory Visit: Payer: Self-pay

## 2022-03-09 DIAGNOSIS — Z1231 Encounter for screening mammogram for malignant neoplasm of breast: Secondary | ICD-10-CM | POA: Insufficient documentation

## 2022-07-06 ENCOUNTER — Ambulatory Visit (INDEPENDENT_AMBULATORY_CARE_PROVIDER_SITE_OTHER): Payer: Medicare Other | Admitting: Physician Assistant

## 2022-07-06 ENCOUNTER — Encounter: Payer: Self-pay | Admitting: Physician Assistant

## 2022-07-06 VITALS — BP 176/85 | HR 67 | Ht 64.0 in | Wt 210.0 lb

## 2022-07-06 DIAGNOSIS — R300 Dysuria: Secondary | ICD-10-CM

## 2022-07-06 DIAGNOSIS — R3129 Other microscopic hematuria: Secondary | ICD-10-CM

## 2022-07-06 DIAGNOSIS — N200 Calculus of kidney: Secondary | ICD-10-CM

## 2022-07-06 NOTE — Progress Notes (Signed)
07/06/2022 10:39 AM   Morgan Dalton Aug 25, 1951 [id number]  CC: Chief Complaint  Patient presents with   Dysuria    HPI: Morgan Dalton is a 71 y.o. female with PMH microscopic hematuria, partial staghorn renal calculus, and recurrent UTIs who presents today for annual follow-up.   Today she reports no UTIs in the past year.  She denies flank pain and gross hematuria.  She has had some mild, intermittent dysuria over the past week.  In-office UA today positive for 2+ blood, trace protein, and 3+ leukocyte esterase; urine microscopy with >30 WBCs/HPF, 11-30 RBCs/HPF, and many bacteria.   PMH: Past Medical History:  Diagnosis Date   Anxiety    Cancer ([place])    Uterine Cancer   GERD (gastroesophageal reflux disease)    History of kidney stones    Hx of dysplastic nevus 10/16/2010   Right lower leg. Severe atypia. Excised: 11/25/2010. 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 Well differentiated. Tx: [facility]    Surgical History: Past Surgical History:  Procedure Laterality Date   ABDOMINAL HYSTERECTOMY     CATARACT EXTRACTION W/PHACO Right 12/07/2016   Procedure: CATARACT EXTRACTION PHACO AND INTRAOCULAR LENS PLACEMENT (IOC);  Surgeon: Birder Robson, MD;  Location: [facility] ORS;  Service: Ophthalmology;  Laterality: Right;  [place] 42.5 AP% 23.2 CDE 9.87 Fluid pack lot # [id number]   CATARACT EXTRACTION W/PHACO Left 01/04/2017   Procedure: CATARACT EXTRACTION PHACO AND INTRAOCULAR LENS PLACEMENT (IOC);  Surgeon: Birder Robson, MD;  Location: [facility] ORS;  Service: Ophthalmology;  Laterality: Left;  [place] 39.2 AP% 23.9 CDE 9.38 Fluid Pack Lot # [id number]   CHOLECYSTECTOMY     COLONOSCOPY WITH PROPOFOL N/A 11/07/2018   Procedure: COLONOSCOPY WITH PROPOFOL;  Surgeon: Lollie Sails, MD;  Location: [facility] ENDOSCOPY;  Service: Endoscopy;  Laterality: N/A;   DILATION  AND CURETTAGE OF UTERUS     EYE SURGERY     KIDNEY STONE SURGERY     KNEE ARTHROSCOPY      Home Medications:  Allergies as of 07/06/2022       Reactions   Statins Other (See Comments)   Myalgia         Medication List        Accurate as of July 06, 2022 10:39 AM. If you have any questions, ask your nurse or doctor.          aspirin EC 81 MG tablet Take 81 mg by mouth daily.   cholecalciferol 10 MCG (400 UNIT) Tabs tablet Commonly known as: VITAMIN D3 Take 1,000 Units by mouth daily.   Co Q-10 100 MG Caps Take 100 mg by mouth daily.   Cranberry 500 MG Tabs Take by mouth. What changed: Another medication with the same name was removed. Continue taking this medication, and follow the directions you see here. Changed by: Debroah Loop, PA-C   ezetimibe 10 MG tablet Commonly known as: ZETIA Take 10 mg by mouth daily.   Glucosamine-Chondroitin 750-600 MG Tabs Take 1 tablet by mouth daily.   hydrochlorothiazide 25 MG tablet Commonly known as: HYDRODIURIL Take 12.5 mg by mouth daily.   Krill Oil 500 MG Caps Take 1,000 mg by mouth daily.   levothyroxine 25 MCG tablet Commonly known as: SYNTHROID Take 25 mcg by mouth daily before breakfast.   Moderna COVID-19 Bival Booster 50 MCG/0.5ML  injection Generic drug: COVID-19 mRNA bivalent vaccine (Moderna) Inject into the muscle.   Moderna COVID-19 Vaccine 100 MCG/0.5ML injection Generic drug: COVID-19 mRNA vaccine (Moderna) Inject into the muscle.   Multiple Vitamins tablet Take 1 tablet by mouth daily.   pantoprazole 40 MG tablet Commonly known as: PROTONIX Take 40 mg by mouth daily.   rosuvastatin 5 MG tablet Commonly known as: CRESTOR Take 5 mg by mouth daily.        Allergies:  Allergies  Allergen Reactions   Statins Other (See Comments)    Myalgia     Family History: Family History  Problem Relation Age of Onset   Breast cancer Paternal Aunt     Social History:   reports that  she has never smoked. She has never used smokeless tobacco. She reports that she does not drink alcohol and does not use drugs.  Physical Exam: BP (!) 176/85   Pulse 67   Ht '5\' 4"'$  (1.626 m)   Wt 210 lb (95.3 kg)   BMI 36.05 kg/m   Constitutional:  Alert and oriented, no acute distress, nontoxic appearing HEENT: [place], AT Cardiovascular: No clubbing, cyanosis, or edema Respiratory: Normal respiratory effort, no increased work of breathing Skin: No rashes, bruises or suspicious lesions Neurologic: Grossly intact, no focal deficits, moving all 4 extremities Psychiatric: Normal mood and affect  Laboratory Data: Results for orders placed or performed in visit on 07/06/22  Microscopic Examination   Urine  Result Value Ref Range   WBC, UA WILL FOLLOW    RBC, Urine WILL FOLLOW    Epithelial Cells (non renal) WILL FOLLOW    Renal Epithel, UA WILL FOLLOW    Casts WILL FOLLOW    Cast Type WILL FOLLOW    Crystals WILL FOLLOW    Bacteria, UA Many (A) None seen/Few  Urinalysis, Complete  Result Value Ref Range   Specific Gravity, UA 1.020 1.005 - 1.030   pH, UA 7.0 5.0 - 7.5   Color, UA Yellow Yellow   Appearance Ur Hazy (A) Clear   Leukocytes,UA 3+ (A) Negative   Protein,UA Trace (A) Negative/Trace   Glucose, UA Negative Negative   Ketones, UA Negative Negative   RBC, UA 2+ (A) Negative   Bilirubin, UA Negative Negative   Urobilinogen, Ur 0.2 0.2 - 1.0 mg/dL   Nitrite, UA Negative Negative   Microscopic Examination See below:    Assessment & Plan:   1. Microscopic hematuria Persistent, no gross hematuria in the past year.  We will continue with plans for repeat cystoscopy next year.  2. Staghorn kidney stones She remains asymptomatic.  We will repeat KUB next year.  3. Dysuria She does not currently meet the clinical definition for recurrent UTIs.  UA is largely stable, though with increased bacteriuria compared to prior.  We will send for culture and treat as indicated.  She is  in agreement with this plan. - Urinalysis, Complete - CULTURE, URINE COMPREHENSIVE  Return in about 1 year (around 07/07/2023) for Annual stone f/u with cysto and KUB prior with Dr. Erlene Quan; will call with culture results.  Debroah Loop, PA-C  [facility] [address] [place], [place] [address] [phone]

## 2022-07-07 LAB — URINALYSIS, COMPLETE
Bilirubin, UA: NEGATIVE
Glucose, UA: NEGATIVE
Ketones, UA: NEGATIVE
Nitrite, UA: NEGATIVE
Specific Gravity, UA: 1.02 (ref 1.005–1.030)
Urobilinogen, Ur: 0.2 mg/dL (ref 0.2–1.0)
pH, UA: 7 (ref 5.0–7.5)

## 2022-07-07 LAB — MICROSCOPIC EXAMINATION: WBC, UA: >30 /hpf — AB (ref 0–5)

## 2022-07-11 LAB — CULTURE, URINE COMPREHENSIVE

## 2022-07-12 ENCOUNTER — Telehealth: Payer: Self-pay

## 2022-07-12 MED ORDER — NITROFURANTOIN MONOHYD MACRO 100 MG PO CAPS: 100.0000 mg | ORAL_CAPSULE | Freq: Two times a day (BID) | ORAL | 0 refills | Status: AC

## 2022-07-12 NOTE — Telephone Encounter (Signed)
Pt notified, Abx Rx sent to pts pharmacy.  

## 2022-07-12 NOTE — Telephone Encounter (Signed)
-----   Message from Nori Riis, PA-C sent at 07/12/2022  2:44 PM EDT ----- Please let Mrs. Southall know that her urine culture was positive for infection and we need to start Macrobid 100 mg twice daily for seven days.

## 2022-07-15 ENCOUNTER — Telehealth: Payer: Self-pay | Admitting: *Deleted

## 2022-07-15 ENCOUNTER — Other Ambulatory Visit: Payer: Self-pay | Admitting: Podiatry

## 2022-07-15 MED ORDER — AMOXICILLIN-POT CLAVULANATE 875-125 MG PO TABS: 1.0000 | ORAL_TABLET | Freq: Two times a day (BID) | ORAL | 0 refills | Status: DC

## 2022-07-15 NOTE — Telephone Encounter (Signed)
Ok to switch to amoxicillin '875mg'$  BID x5 days.

## 2022-07-15 NOTE — Telephone Encounter (Signed)
Patient called triage line to report ongoing diarrhea with mild headaches. She has been staying hydrated and taking probiotics with no improvement. She requests if antibiotic be switched? Explained culture results and sensitives.

## 2022-07-15 NOTE — Telephone Encounter (Signed)
Sent in RX to [facility], voiced understanding.

## 2022-07-22 ENCOUNTER — Ambulatory Visit (INDEPENDENT_AMBULATORY_CARE_PROVIDER_SITE_OTHER): Payer: Medicare Other | Admitting: Dermatology

## 2022-07-22 DIAGNOSIS — L814 Other melanin hyperpigmentation: Secondary | ICD-10-CM

## 2022-07-22 DIAGNOSIS — Z85828 Personal history of other malignant neoplasm of skin: Secondary | ICD-10-CM | POA: Diagnosis not present

## 2022-07-22 DIAGNOSIS — L578 Other skin changes due to chronic exposure to nonionizing radiation: Secondary | ICD-10-CM

## 2022-07-22 DIAGNOSIS — Z1283 Encounter for screening for malignant neoplasm of skin: Secondary | ICD-10-CM | POA: Diagnosis not present

## 2022-07-22 DIAGNOSIS — Z86018 Personal history of other benign neoplasm: Secondary | ICD-10-CM

## 2022-07-22 DIAGNOSIS — D229 Melanocytic nevi, unspecified: Secondary | ICD-10-CM

## 2022-07-22 DIAGNOSIS — L821 Other seborrheic keratosis: Secondary | ICD-10-CM

## 2022-07-22 DIAGNOSIS — L820 Inflamed seborrheic keratosis: Secondary | ICD-10-CM

## 2022-07-22 DIAGNOSIS — D1800 Hemangioma unspecified site: Secondary | ICD-10-CM

## 2022-07-22 DIAGNOSIS — D239 Other benign neoplasm of skin, unspecified: Secondary | ICD-10-CM

## 2022-07-22 NOTE — Progress Notes (Signed)
Follow-Up Visit   Subjective  Morgan Dalton is a 71 y.o. female who presents for the following: Annual Exam (History of SCC and dysplastic nevus - The patient presents for Total-Body Skin Exam (TBSE) for skin cancer screening and mole check.  The patient has spots, moles and lesions to be evaluated, some may be new or changing and the patient has concerns that these could be cancer./).  The following portions of the chart were reviewed this encounter and updated as appropriate:   Tobacco  Allergies  Meds  Problems  Med Hx  Surg Hx  Fam Hx     Review of Systems:  No other skin or systemic complaints except as noted in HPI or Assessment and Plan.  Objective  Well appearing patient in no apparent distress; mood and affect are within normal limits.  A full examination was performed including scalp, head, eyes, ears, nose, lips, neck, chest, axillae, abdomen, back, buttocks, bilateral upper extremities, bilateral lower extremities, hands, feet, fingers, toes, fingernails, and toenails. All findings within normal limits unless otherwise noted below.  Back x 5, left brow x 1, right post thigh x 1. left pretibial x 2 (9) Erythematous stuck-on, waxy papule or plaque   Assessment & Plan   History of Squamous Cell Carcinoma of the Skin - No evidence of recurrence today - No lymphadenopathy - Recommend regular full body skin exams - Recommend daily broad spectrum sunscreen SPF 30+ to sun-exposed areas, reapply every 2 hours as needed.  - Call if any new or changing lesions are noted between office visits  History of Dysplastic Nevi - No evidence of recurrence today - Recommend regular full body skin exams - Recommend daily broad spectrum sunscreen SPF 30+ to sun-exposed areas, reapply every 2 hours as needed.  - Call if any new or changing lesions are noted between office visits  Lentigines - Scattered tan macules - Due to sun exposure - Benign-appearing, observe - Recommend  daily broad spectrum sunscreen SPF 30+ to sun-exposed areas, reapply every 2 hours as needed. - Call for any changes  Seborrheic Keratoses - Stuck-on, waxy, tan-brown papules and/or plaques  - Benign-appearing - Discussed benign etiology and prognosis. - Observe - Call for any changes  Melanocytic Nevi - Tan-brown and/or pink-flesh-colored symmetric macules and papules - Benign appearing on exam today - Observation - Call clinic for new or changing moles - Recommend daily use of broad spectrum spf 30+ sunscreen to sun-exposed areas.   Hemangiomas - Red papules - Discussed benign nature - Observe - Call for any changes  Actinic Damage - Chronic condition, secondary to cumulative UV/sun exposure - diffuse scaly erythematous macules with underlying dyspigmentation - Recommend daily broad spectrum sunscreen SPF 30+ to sun-exposed areas, reapply every 2 hours as needed.  - Staying in the shade or wearing long sleeves, sun glasses (UVA+UVB protection) and wide brim hats (4-inch brim around the entire circumference of the hat) are also recommended for sun protection.  - Call for new or changing lesions.  Skin cancer screening performed today.  Inflamed seborrheic keratosis (9) Back x 5, left brow x 1, right post thigh x 1. left pretibial x 2  Destruction of lesion - Back x 5, left brow x 1, right post thigh x 1. left pretibial x 2 Complexity: simple   Destruction method: cryotherapy   Informed consent: discussed and consent obtained   Timeout:  patient name, date of birth, surgical site, and procedure verified Lesion destroyed using liquid nitrogen: Yes  Region frozen until ice ball extended beyond lesion: Yes   Outcome: patient tolerated procedure well with no complications   Post-procedure details: wound care instructions given     Return in about 1 year (around 07/23/2023) for TBSE.  I, Ashok Cordia, CMA, am acting as scribe for Sarina Ser, MD . Documentation: I have  reviewed the above documentation for accuracy and completeness, and I agree with the above.  Sarina Ser, MD

## 2022-07-22 NOTE — Patient Instructions (Signed)
Cryotherapy Aftercare  Wash gently with soap and water everyday.   Apply Vaseline and Band-Aid daily until healed.     Due to recent changes in healthcare laws, you may see results of your pathology and/or laboratory studies on MyChart before the doctors have had a chance to review them. We understand that in some cases there may be results that are confusing or concerning to you. Please understand that not all results are received at the same time and often the doctors may need to interpret multiple results in order to provide you with the best plan of care or course of treatment. Therefore, we ask that you please give us 2 business days to thoroughly review all your results before contacting the office for clarification. Should we see a critical lab result, you will be contacted sooner.   If You Need Anything After Your Visit  If you have any questions or concerns for your doctor, please call our main line at [phone] and press option 4 to reach your doctor's medical assistant. If no one answers, please leave a voicemail as directed and we will return your call as soon as possible. Messages left after 4 pm will be answered the following business day.   You may also send us a message via MyChart. We typically respond to MyChart messages within 1-2 business days.  For prescription refills, please ask your pharmacy to contact our office. Our fax number is [phone].  If you have an urgent issue when the clinic is closed that cannot wait until the next business day, you can page your doctor at the number below.    Please note that while we do our best to be available for urgent issues outside of office hours, we are not available 24/7.   If you have an urgent issue and are unable to reach us, you may choose to seek medical care at your doctor's office, retail clinic, urgent care center, or emergency room.  If you have a medical emergency, please immediately call 911 or go to the  emergency department.  Pager Numbers  - Dr. Kowalski: [phone]  - Dr. Moye: [phone]  - Dr. Stewart: [phone]  In the event of inclement weather, please call our main line at [phone] for an update on the status of any delays or closures.  Dermatology Medication Tips: Please keep the boxes that topical medications come in in order to help keep track of the instructions about where and how to use these. Pharmacies typically print the medication instructions only on the boxes and not directly on the medication tubes.   If your medication is too expensive, please contact our office at [phone] option 4 or send us a message through MyChart.   We are unable to tell what your co-pay for medications will be in advance as this is different depending on your insurance coverage. However, we may be able to find a substitute medication at lower cost or fill out paperwork to get insurance to cover a needed medication.   If a prior authorization is required to get your medication covered by your insurance company, please allow us 1-2 business days to complete this process.  Drug prices often vary depending on where the prescription is filled and some pharmacies may offer cheaper prices.  The website www.goodrx.com contains coupons for medications through different pharmacies. The prices here do not account for what the cost may be with help from insurance (it may be cheaper with your insurance), but the website can   give you the price if you did not use any insurance.  - You can print the associated coupon and take it with your prescription to the pharmacy.  - You may also stop by our office during regular business hours and pick up a GoodRx coupon card.  - If you need your prescription sent electronically to a different pharmacy, notify our office through [facility] MyChart or by phone at [phone] option 4.     Si Usted Necesita Algo Despus de Su Visita  Tambin puede  enviarnos un mensaje a travs de MyChart. Por lo general respondemos a los mensajes de MyChart en el transcurso de 1 a 2 das hbiles.  Para renovar recetas, por favor pida a su farmacia que se ponga en contacto con nuestra oficina. Nuestro nmero de fax es el [phone].  Si tiene un asunto urgente cuando la clnica est cerrada y que no puede esperar hasta el siguiente da hbil, puede llamar/localizar a su doctor(a) al nmero que aparece a continuacin.   Por favor, tenga en cuenta que aunque hacemos todo lo posible para estar disponibles para asuntos urgentes fuera del horario de oficina, no estamos disponibles las 24 horas del da, los 7 das de la semana.   Si tiene un problema urgente y no puede comunicarse con nosotros, puede optar por buscar atencin mdica  en el consultorio de su doctor(a), en una clnica privada, en un centro de atencin urgente o en una sala de emergencias.  Si tiene una emergencia mdica, por favor llame inmediatamente al 911 o vaya a la sala de emergencias.  Nmeros de bper  - Dr. Kowalski: [phone]  - Dra. Moye: [phone]  - Dra. Stewart: [phone]  En caso de inclemencias del tiempo, por favor llame a nuestra lnea principal al [phone] para una actualizacin sobre el estado de cualquier retraso o cierre.  Consejos para la medicacin en dermatologa: Por favor, guarde las cajas en las que vienen los medicamentos de uso tpico para ayudarle a seguir las instrucciones sobre dnde y cmo usarlos. Las farmacias generalmente imprimen las instrucciones del medicamento slo en las cajas y no directamente en los tubos del medicamento.   Si su medicamento es muy caro, por favor, pngase en contacto con nuestra oficina llamando al [phone] y presione la opcin 4 o envenos un mensaje a travs de MyChart.   No podemos decirle cul ser su copago por los medicamentos por adelantado ya que esto es diferente dependiendo de la cobertura de su seguro.  Sin embargo, es posible que podamos encontrar un medicamento sustituto a menor costo o llenar un formulario para que el seguro cubra el medicamento que se considera necesario.   Si se requiere una autorizacin previa para que su compaa de seguros cubra su medicamento, por favor permtanos de 1 a 2 das hbiles para completar este proceso.  Los precios de los medicamentos varan con frecuencia dependiendo del lugar de dnde se surte la receta y alguna farmacias pueden ofrecer precios ms baratos.  El sitio web www.goodrx.com tiene cupones para medicamentos de diferentes farmacias. Los precios aqu no tienen en cuenta lo que podra costar con la ayuda del seguro (puede ser ms barato con su seguro), pero el sitio web puede darle el precio si no utiliz ningn seguro.  - Puede imprimir el cupn correspondiente y llevarlo con su receta a la farmacia.  - Tambin puede pasar por nuestra oficina durante el horario de atencin regular y recoger una tarjeta de cupones de GoodRx.  -   Si necesita que su receta se enve electrnicamente a una farmacia diferente, informe a nuestra oficina a travs de MyChart de [place] o por telfono llamando al [phone] y presione la opcin 4.  

## 2022-07-27 ENCOUNTER — Encounter: Payer: Self-pay | Admitting: Dermatology

## 2022-08-04 ENCOUNTER — Encounter
Admission: RE | Admit: 2022-08-04 | Discharge: 2022-08-04 | Disposition: A | Discharge status: Home / Self Care | Payer: Medicare Other | Source: Ambulatory Visit | Attending: Podiatry | Admitting: Podiatry

## 2022-08-04 VITALS — Ht 64.0 in | Wt 212.0 lb

## 2022-08-04 DIAGNOSIS — Z01812 Encounter for preprocedural laboratory examination: Secondary | ICD-10-CM

## 2022-08-04 DIAGNOSIS — E785 Hyperlipidemia, unspecified: Secondary | ICD-10-CM

## 2022-08-04 HISTORY — DX: Epigastric pain: R10.13

## 2022-08-04 HISTORY — DX: Hyperlipidemia, unspecified: E78.5

## 2022-08-04 HISTORY — DX: Malignant neoplasm of endometrium: C54.1

## 2022-08-04 HISTORY — DX: Other fracture of left lower leg, initial encounter for closed fracture: S82.892A

## 2022-08-04 NOTE — Patient Instructions (Addendum)
Your procedure is scheduled on: Wednesday, August 30 Report to the Registration Desk on the 1st floor of the [facility]. To find out your arrival time, please call [phone] between 1PM - 3PM on: Tuesday, August 29 If your arrival time is 6:00 am, do not arrive prior to that time as the [facility] entrance doors do not open until 6:00 am.  REMEMBER: Instructions that are not followed completely may result in serious medical risk, up to and including death; or upon the discretion of your surgeon and anesthesiologist your surgery may need to be rescheduled.  Do not eat food after midnight the night before surgery.  No gum chewing, lozengers or hard candies.  You may however, drink CLEAR liquids up to 2 hours before you are scheduled to arrive for your surgery. Do not drink anything within 2 hours of your scheduled arrival time.  Clear liquids include: - water  - apple juice without pulp - gatorade (not RED colors) - black coffee or tea (Do NOT add milk or creamers to the coffee or tea) Do NOT drink anything that is not on this list.  In addition, your doctor has ordered for you to drink the provided  Ensure Pre-Surgery Clear Carbohydrate Drink  Drinking this carbohydrate drink up to two hours before surgery helps to reduce insulin resistance and improve patient outcomes. Please complete drinking 2 hours prior to scheduled arrival time.  TAKE THESE MEDICATIONS THE MORNING OF SURGERY WITH A SIP OF WATER:  Ezetimibe (Zetia) Levothyroxine Pantoprazole (Protonix) - (take one the night before and one on the morning of surgery - helps to prevent nausea after surgery.) Sertraline (Zoloft)  One week prior to surgery: starting August 23 Stop aspirin and Anti-inflammatories (NSAIDS) such as Advil, Aleve, Ibuprofen, Motrin, Naproxen, Naprosyn and Aspirin based products such as Excedrin, Goodys Powder, BC Powder. Stop ANY OVER THE COUNTER supplements until after surgery. Stop vitamin D,  coenzyme Q10, cranberry, glucosamine, krill oil, multiple vitamins. You may however, continue to take Tylenol if needed for pain up until the day of surgery.  No Alcohol for 24 hours before or after surgery.  No Smoking including e-cigarettes for 24 hours prior to surgery.  No chewable tobacco products for at least 6 hours prior to surgery.  No nicotine patches on the day of surgery.  Do not use any "recreational" drugs for at least a week prior to your surgery.  Please be advised that the combination of cocaine and anesthesia may have negative outcomes, up to and including death. If you test positive for cocaine, your surgery will be cancelled.  On the morning of surgery brush your teeth with toothpaste and water, you may rinse your mouth with mouthwash if you wish. Do not swallow any toothpaste or mouthwash.  Use CHG Soap as directed on instruction sheet.  Do not wear jewelry, make-up, hairpins, clips or nail polish.  Do not wear lotions, powders, or perfumes.   Do not shave body from the neck down 48 hours prior to surgery just in case you cut yourself which could leave a site for infection.  Also, freshly shaved skin may become irritated if using the CHG soap.  Contact lenses, hearing aids and dentures may not be worn into surgery.  Do not bring valuables to the hospital. [facility] is not responsible for any missing/lost belongings or valuables.   Notify your doctor if there is any change in your medical condition (cold, fever, infection).  Wear comfortable clothing (specific to your  surgery type) to the hospital.  After surgery, you can help prevent lung complications by doing breathing exercises.  Take deep breaths and cough every 1-2 hours. Your doctor may order a device called an Incentive Spirometer to help you take deep breaths.  If you are being discharged the day of surgery, you will not be allowed to drive home. You will need a responsible adult (18 years or older)  to drive you home and stay with you that night.   If you are taking public transportation, you will need to have a responsible adult (18 years or older) with you. Please confirm with your physician that it is acceptable to use public transportation.   Please call the [facility] [facility] at [phone] if you have any questions about these instructions.  Surgery Visitation Policy:  Patients undergoing a surgery or procedure may have two family members or support persons with them as long as the person is not COVID-19 positive or experiencing its symptoms.   Preparing for Surgery with [place] (CHG) Soap    Before surgery, you can play an important role by reducing the number of germs on your skin.  CHG (Chlorhexidine gluconate) soap is an antiseptic cleanser which kills germs and bonds with the skin to continue killing germs even after washing.  Please do not use if you have an allergy to CHG or antibacterial soaps. If your skin becomes reddened/irritated stop using the CHG.  1. Shower the NIGHT BEFORE SURGERY and the MORNING OF SURGERY with CHG soap.  2. If you choose to wash your hair, wash your hair first as usual with your normal shampoo.  3. After shampooing, rinse your hair and body thoroughly to remove the shampoo.  4. Use CHG as you would any other liquid soap. You can apply CHG directly to the skin and wash gently with a scrungie or a clean washcloth.  5. Apply the CHG soap to your body only from the neck down. Do not use on open wounds or open sores. Avoid contact with your eyes, ears, mouth, and genitals (private parts). Wash face and genitals (private parts) with your normal soap.  6. Wash thoroughly, paying special attention to the area where your surgery will be performed.  7. Thoroughly rinse your body with warm water.  8. Do not shower/wash with your normal soap after using and rinsing off the CHG soap.  9. Pat yourself dry with a clean  towel.  10. Wear clean pajamas to bed the night before surgery.  12. Place clean sheets on your bed the night of your first shower and do not sleep with pets.  13. Shower again with the CHG soap on the day of surgery prior to arriving at the hospital.  14. Do not apply any deodorants/lotions/powders.  15. Please wear clean clothes to the hospital.

## 2022-08-05 ENCOUNTER — Encounter
Admission: RE | Admit: 2022-08-05 | Discharge: 2022-08-05 | Disposition: A | Discharge status: Home / Self Care | Payer: Medicare Other | Source: Ambulatory Visit | Attending: Podiatry | Admitting: Podiatry

## 2022-08-05 DIAGNOSIS — Z01812 Encounter for preprocedural laboratory examination: Secondary | ICD-10-CM

## 2022-08-05 DIAGNOSIS — Z01810 Encounter for preprocedural cardiovascular examination: Secondary | ICD-10-CM | POA: Insufficient documentation

## 2022-08-05 DIAGNOSIS — E785 Hyperlipidemia, unspecified: Secondary | ICD-10-CM | POA: Diagnosis not present

## 2022-08-10 MED ORDER — CHLORHEXIDINE GLUCONATE 0.12 % MT SOLN
15.0000 mL | Freq: Once | OROMUCOSAL | Status: AC
  Administered 2022-08-11: 15 mL via OROMUCOSAL

## 2022-08-10 MED ORDER — CEFAZOLIN SODIUM-DEXTROSE 2-4 GM/100ML-% IV SOLN
2.0000 g | INTRAVENOUS | Status: AC
  Administered 2022-08-11: 2 g via INTRAVENOUS

## 2022-08-10 MED ORDER — LACTATED RINGERS IV SOLN: INTRAVENOUS | Status: DC

## 2022-08-10 MED ORDER — ORAL CARE MOUTH RINSE: 15.0000 mL | Freq: Once | OROMUCOSAL | Status: AC

## 2022-08-11 ENCOUNTER — Ambulatory Visit: Payer: Medicare Other | Admitting: Anesthesiology

## 2022-08-11 ENCOUNTER — Other Ambulatory Visit: Payer: Self-pay

## 2022-08-11 ENCOUNTER — Encounter
Admission: RE | Disposition: A | Discharge status: Home / Self Care | Payer: Self-pay | Source: Home / Self Care | Attending: Podiatry

## 2022-08-11 ENCOUNTER — Encounter: Payer: Self-pay | Admitting: Podiatry

## 2022-08-11 ENCOUNTER — Ambulatory Visit
Admission: RE | Admit: 2022-08-11 | Discharge: 2022-08-11 | Disposition: A | Discharge status: Home / Self Care | Payer: Medicare Other | Attending: Podiatry | Admitting: Podiatry

## 2022-08-11 ENCOUNTER — Ambulatory Visit: Payer: Medicare Other

## 2022-08-11 DIAGNOSIS — I10 Essential (primary) hypertension: Secondary | ICD-10-CM | POA: Diagnosis not present

## 2022-08-11 DIAGNOSIS — E039 Hypothyroidism, unspecified: Secondary | ICD-10-CM | POA: Diagnosis not present

## 2022-08-11 DIAGNOSIS — M2012 Hallux valgus (acquired), left foot: Secondary | ICD-10-CM | POA: Insufficient documentation

## 2022-08-11 DIAGNOSIS — Z8542 Personal history of malignant neoplasm of other parts of uterus: Secondary | ICD-10-CM | POA: Insufficient documentation

## 2022-08-11 DIAGNOSIS — Z85828 Personal history of other malignant neoplasm of skin: Secondary | ICD-10-CM | POA: Insufficient documentation

## 2022-08-11 DIAGNOSIS — K219 Gastro-esophageal reflux disease without esophagitis: Secondary | ICD-10-CM | POA: Insufficient documentation

## 2022-08-11 DIAGNOSIS — E7800 Pure hypercholesterolemia, unspecified: Secondary | ICD-10-CM | POA: Insufficient documentation

## 2022-08-11 HISTORY — PX: HALLUX VALGUS LAPIDUS: SHX6626

## 2022-08-11 SURGERY — BUNIONECTOMY, LAPIDUS
Anesthesia: General | Laterality: Left

## 2022-08-11 MED ORDER — OXYCODONE-ACETAMINOPHEN 5-325 MG PO TABS: 1.0000 | ORAL_TABLET | Freq: Four times a day (QID) | ORAL | 0 refills | Status: DC | PRN

## 2022-08-11 MED ORDER — ONDANSETRON HCL 4 MG/2ML IJ SOLN: 4.0000 mg | Freq: Four times a day (QID) | INTRAMUSCULAR | Status: DC | PRN

## 2022-08-11 MED ORDER — EPHEDRINE SULFATE (PRESSORS) 50 MG/ML IJ SOLN
INTRAMUSCULAR | Status: DC | PRN
  Administered 2022-08-11: 10 mg via INTRAVENOUS
  Administered 2022-08-11: 5 mg via INTRAVENOUS

## 2022-08-11 MED ORDER — BUPIVACAINE HCL (PF) 0.25 % IJ SOLN
INTRAMUSCULAR | Status: AC
  Filled 2022-08-11: qty 30

## 2022-08-11 MED ORDER — ACETAMINOPHEN 10 MG/ML IV SOLN
INTRAVENOUS | Status: DC | PRN
  Administered 2022-08-11: 1000 mg via INTRAVENOUS

## 2022-08-11 MED ORDER — DEXAMETHASONE SODIUM PHOSPHATE 10 MG/ML IJ SOLN
INTRAMUSCULAR | Status: AC
  Filled 2022-08-11: qty 1

## 2022-08-11 MED ORDER — FENTANYL CITRATE (PF) 100 MCG/2ML IJ SOLN
INTRAMUSCULAR | Status: AC
  Filled 2022-08-11: qty 2

## 2022-08-11 MED ORDER — ONDANSETRON HCL 4 MG/2ML IJ SOLN
INTRAMUSCULAR | Status: AC
  Filled 2022-08-11: qty 2

## 2022-08-11 MED ORDER — PROPOFOL 10 MG/ML IV BOLUS
INTRAVENOUS | Status: DC | PRN
  Administered 2022-08-11: 30 mg via INTRAVENOUS
  Administered 2022-08-11: 170 mg via INTRAVENOUS

## 2022-08-11 MED ORDER — ACETAMINOPHEN 10 MG/ML IV SOLN
INTRAVENOUS | Status: AC
  Filled 2022-08-11: qty 100

## 2022-08-11 MED ORDER — BUPIVACAINE HCL 0.25 % IJ SOLN
INTRAMUSCULAR | Status: DC | PRN
  Administered 2022-08-11: 10 mL

## 2022-08-11 MED ORDER — CHLORHEXIDINE GLUCONATE 0.12 % MT SOLN
OROMUCOSAL | Status: AC
  Filled 2022-08-11: qty 15

## 2022-08-11 MED ORDER — OXYCODONE HCL 5 MG/5ML PO SOLN: 5.0000 mg | Freq: Once | ORAL | Status: DC | PRN

## 2022-08-11 MED ORDER — OXYCODONE HCL 5 MG PO TABS: 5.0000 mg | ORAL_TABLET | Freq: Once | ORAL | Status: DC | PRN

## 2022-08-11 MED ORDER — CEFAZOLIN SODIUM-DEXTROSE 2-4 GM/100ML-% IV SOLN
INTRAVENOUS | Status: AC
  Filled 2022-08-11: qty 100

## 2022-08-11 MED ORDER — PHENYLEPHRINE HCL (PRESSORS) 10 MG/ML IV SOLN
INTRAVENOUS | Status: DC | PRN
  Administered 2022-08-11: 160 ug via INTRAVENOUS
  Administered 2022-08-11: 80 ug via INTRAVENOUS
  Administered 2022-08-11: 160 ug via INTRAVENOUS
  Administered 2022-08-11: 80 ug via INTRAVENOUS
  Administered 2022-08-11: 160 ug via INTRAVENOUS

## 2022-08-11 MED ORDER — 0.9 % SODIUM CHLORIDE (POUR BTL) OPTIME
TOPICAL | Status: DC | PRN
  Administered 2022-08-11: 500 mL

## 2022-08-11 MED ORDER — BUPIVACAINE LIPOSOME 1.3 % IJ SUSP
INTRAMUSCULAR | Status: DC | PRN
  Administered 2022-08-11: 10 mL

## 2022-08-11 MED ORDER — ONDANSETRON HCL 4 MG/2ML IJ SOLN
INTRAMUSCULAR | Status: DC | PRN
  Administered 2022-08-11: 4 mg via INTRAVENOUS

## 2022-08-11 MED ORDER — LIDOCAINE HCL (PF) 2 % IJ SOLN
INTRAMUSCULAR | Status: AC
  Filled 2022-08-11: qty 5

## 2022-08-11 MED ORDER — METOCLOPRAMIDE HCL 10 MG PO TABS: 5.0000 mg | ORAL_TABLET | Freq: Three times a day (TID) | ORAL | Status: DC | PRN

## 2022-08-11 MED ORDER — ONDANSETRON HCL 4 MG PO TABS: 4.0000 mg | ORAL_TABLET | Freq: Four times a day (QID) | ORAL | Status: DC | PRN

## 2022-08-11 MED ORDER — FENTANYL CITRATE (PF) 100 MCG/2ML IJ SOLN
INTRAMUSCULAR | Status: DC | PRN
  Administered 2022-08-11 (×2): 25 ug via INTRAVENOUS
  Administered 2022-08-11: 50 ug via INTRAVENOUS

## 2022-08-11 MED ORDER — PHENYLEPHRINE 80 MCG/ML (10ML) SYRINGE FOR IV PUSH (FOR BLOOD PRESSURE SUPPORT)
PREFILLED_SYRINGE | INTRAVENOUS | Status: AC
  Filled 2022-08-11: qty 10

## 2022-08-11 MED ORDER — LIDOCAINE HCL (CARDIAC) PF 100 MG/5ML IV SOSY
PREFILLED_SYRINGE | INTRAVENOUS | Status: DC | PRN
  Administered 2022-08-11: 80 mg via INTRAVENOUS

## 2022-08-11 MED ORDER — PROPOFOL 10 MG/ML IV BOLUS
INTRAVENOUS | Status: AC
  Filled 2022-08-11: qty 20

## 2022-08-11 MED ORDER — DEXAMETHASONE SODIUM PHOSPHATE 10 MG/ML IJ SOLN
INTRAMUSCULAR | Status: DC | PRN
  Administered 2022-08-11: 10 mg via INTRAVENOUS

## 2022-08-11 MED ORDER — METOCLOPRAMIDE HCL 5 MG/ML IJ SOLN: 5.0000 mg | Freq: Three times a day (TID) | INTRAMUSCULAR | Status: DC | PRN

## 2022-08-11 MED ORDER — LIDOCAINE HCL (PF) 1 % IJ SOLN
INTRAMUSCULAR | Status: AC
Start: 2022-08-11 — End: ?
  Filled 2022-08-11: qty 30

## 2022-08-11 MED ORDER — BUPIVACAINE HCL (PF) 0.5 % IJ SOLN
INTRAMUSCULAR | Status: AC
  Filled 2022-08-11: qty 30

## 2022-08-11 MED ORDER — FENTANYL CITRATE (PF) 100 MCG/2ML IJ SOLN: 25.0000 ug | INTRAMUSCULAR | Status: DC | PRN

## 2022-08-11 MED ORDER — EPHEDRINE 5 MG/ML INJ
INTRAVENOUS | Status: AC
  Filled 2022-08-11: qty 5

## 2022-08-11 MED ORDER — BUPIVACAINE LIPOSOME 1.3 % IJ SUSP
INTRAMUSCULAR | Status: AC
Start: 2022-08-11 — End: ?
  Filled 2022-08-11: qty 10

## 2022-08-11 SURGICAL SUPPLY — 52 items
BLADE MED AGGRESSIVE (BLADE) ×1 IMPLANT
BLADE SAW LAPIPLASTY 40X11 (BLADE) IMPLANT
BLADE SURG 15 STRL LF DISP TIS (BLADE) ×2 IMPLANT
BLADE SURG 15 STRL SS (BLADE) ×2
BNDG ELASTIC 4X5.8 VLCR NS LF (GAUZE/BANDAGES/DRESSINGS) ×2 IMPLANT
BNDG GAUZE DERMACEA FLUFF 4 (GAUZE/BANDAGES/DRESSINGS) ×1 IMPLANT
BNDG STRETCH GAUZE 3IN X12FT (GAUZE/BANDAGES/DRESSINGS) ×1 IMPLANT
BOOT STEPPER DURA LG (SOFTGOODS) IMPLANT
BUR 4X45 EGG (BURR) ×1 IMPLANT
COVER PIN YLW 0.028-062 (MISCELLANEOUS) ×1 IMPLANT
CUFF TOURN SGL QUICK 12 (TOURNIQUET CUFF) IMPLANT
CUFF TOURN SGL QUICK 18X4 (TOURNIQUET CUFF) IMPLANT
DRAPE FLUOR MINI C-ARM 54X84 (DRAPES) ×1 IMPLANT
DURAPREP 26ML APPLICATOR (WOUND CARE) ×1 IMPLANT
ELECT REM PT RETURN 9FT ADLT (ELECTROSURGICAL) ×1
ELECTRODE REM PT RTRN 9FT ADLT (ELECTROSURGICAL) ×1 IMPLANT
GAUZE SPONGE 4X4 12PLY STRL (GAUZE/BANDAGES/DRESSINGS) ×1 IMPLANT
GAUZE STRETCH 2X75IN STRL (MISCELLANEOUS) ×1 IMPLANT
GAUZE XEROFORM 1X8 LF (GAUZE/BANDAGES/DRESSINGS) ×1 IMPLANT
GLOVE BIO SURGEON STRL SZ7.5 (GLOVE) ×1 IMPLANT
GLOVE SURG UNDER LTX SZ8 (GLOVE) ×1 IMPLANT
GOWN STRL REUS W/ TWL XL LVL3 (GOWN DISPOSABLE) ×2 IMPLANT
GOWN STRL REUS W/TWL XL LVL3 (GOWN DISPOSABLE) ×2
LABEL OR SOLS (LABEL) ×1 IMPLANT
LAPIPLASTY SYS 4A (Orthopedic Implant) ×1 IMPLANT
MANIFOLD NEPTUNE II (INSTRUMENTS) ×1 IMPLANT
NDL FILTER BLUNT 18X1 1/2 (NEEDLE) ×1 IMPLANT
NDL HYPO 25X1 1.5 SAFETY (NEEDLE) ×2 IMPLANT
NEEDLE FILTER BLUNT 18X 1/2SAF (NEEDLE) ×1
NEEDLE FILTER BLUNT 18X1 1/2 (NEEDLE) ×1 IMPLANT
NEEDLE HYPO 22GX1.5 SAFETY (NEEDLE) ×1 IMPLANT
NEEDLE HYPO 25X1 1.5 SAFETY (NEEDLE) ×1 IMPLANT
NS IRRIG 500ML POUR BTL (IV SOLUTION) ×1 IMPLANT
PACK EXTREMITY ARMC (MISCELLANEOUS) ×1 IMPLANT
RASP SM TEAR CROSS CUT (RASP) ×1 IMPLANT
SCREW 2.7 HIGH PITCH LOCKING (Screw) IMPLANT
SCREW HIGH PITCH LOCK 2.7 (Screw) IMPLANT
SPLINT CAST 1 STEP 4X30 (MISCELLANEOUS) ×1 IMPLANT
SPLINT PLASTER CAST FAST 5X30 (CAST SUPPLIES) ×1 IMPLANT
STOCKINETTE M/LG 89821 (MISCELLANEOUS) ×1 IMPLANT
STRAP SAFETY 5IN WIDE (MISCELLANEOUS) ×1 IMPLANT
STRIP CLOSURE SKIN 1/4X4 (GAUZE/BANDAGES/DRESSINGS) ×1 IMPLANT
SUT ETHILON 3-0 (SUTURE) IMPLANT
SUT VIC AB 3-0 SH 27 (SUTURE) ×1
SUT VIC AB 3-0 SH 27X BRD (SUTURE) IMPLANT
SUT VIC AB 4-0 FS2 27 (SUTURE) ×1 IMPLANT
SYR 10ML LL (SYRINGE) ×1 IMPLANT
SYSTEM LAPIPLASTY 4A (Orthopedic Implant) IMPLANT
TRAP FLUID SMOKE EVACUATOR (MISCELLANEOUS) ×1 IMPLANT
WATER STERILE IRR 500ML POUR (IV SOLUTION) ×1 IMPLANT
WIRE Z .045 C-WIRE SPADE TIP (WIRE) ×2 IMPLANT
WIRE Z .062 C-WIRE SPADE TIP (WIRE) ×2 IMPLANT

## 2022-08-11 NOTE — Op Note (Signed)
Operative note   Surgeon:Mayleen Borrero Lawyer: None    Preop diagnosis: Hallux valgus deformity left foot    Postop diagnosis: Same    Procedure: Lapidus hallux valgus correction left foot    EBL: Minimal    Anesthesia:local and general.  Local consisted of a one-to-one mixture of 0.25% bupivacaine and Exparel long-acting anesthetic.  A total of 20 cc was used    Hemostasis: Ankle tourniquet inflated to 200 mmHg for 89 minutes    Specimen: None    Complications: None    Operative indications:Kasia S Hast is an 71 y.o. that presents today for surgical intervention.  The risks/benefits/alternatives/complications have been discussed and consent has been given.    Procedure:  Patient was brought into the OR and placed on the operating table in thesupine position. After anesthesia was obtained theleft lower extremity was prepped and draped in usual sterile fashion.  Attention was directed to the dorsal aspect of the foot where a dorsal incision was made at the first met cuneiforms joint.  Sharp and blunt dissection was carried down to the periosteum.  Subperiosteal dissection was then undertaken.  This exposed the first met cuneiform joint.  This was then freed and loosened.  A small fulcrum was placed between the base of the first metatarsal and second metatarsal.  Next the joint positioner was placed on the medial aspect of the metatarsal.  A small stab incision was made at the second metatarsal.  Compression was placed for the positioner.  Good realignment of the first intermetatarsal angle was noted at this time.  Attention was then directed to the dorsomedial first MTPJ where an incision was performed.  Sharp and blunt dissection was carried down to the capsule.  The intermetatarsal space was then entered.  The conjoined tendon of the abductor was then freed from the base of the proximal phalanx.  Attention was to redirected to the first met cuneiform joint.  At this time the  osteotomy cut guide was placed into the joint region.  2 vertical cuts were then made.  The cartilage material was removed from the first met cuneiform joint and the joint was then prepped with a 2.0 mm drill bit.  The joint compressor was then placed.  Good compression and realignment was noted.  Next the medial and dorsal locking plates were then placed from the [place] set.  Realignment and stability was noted in all planes.  Attention was redirected to the dorsomedial first MTPJ.  A longitudinal capsulotomy was performed.  The dorsomedial eminence was noted and transected and smoothed with a power rasp. Closure was then performed after all areas were irrigated.  3-0 Vicryl for the capsular tissue.  4-0 Vicryl in subcutaneous tissue and a 4-0 Monocryl for the skin.   Further local anesthesia was performed at the end of the case.    Patient tolerated the procedure and anesthesia well.  Was transported from the OR to the PACU with all vital signs stable and vascular status intact. To be discharged per routine protocol.  Will follow up in approximately 1 week in the outpatient clinic.

## 2022-08-11 NOTE — Anesthesia Preprocedure Evaluation (Signed)
Anesthesia Evaluation  Patient identified by MRN, date of birth, ID band Patient awake    Reviewed: Allergy & Precautions, NPO status , Patient's Chart, lab work & pertinent test results  History of Anesthesia Complications Negative for: history of anesthetic complications  Airway Mallampati: III  TM Distance: <3 FB Neck ROM: full    Dental  (+) Chipped   Pulmonary neg pulmonary ROS, neg shortness of breath,    Pulmonary exam normal        Cardiovascular Exercise Tolerance: Good hypertension, (-) angina(-) Past MI Normal cardiovascular exam     Neuro/Psych PSYCHIATRIC DISORDERS negative neurological ROS     GI/Hepatic Neg liver ROS, GERD  ,  Endo/Other  Hypothyroidism   Renal/GU Renal disease     Musculoskeletal   Abdominal   Peds  Hematology negative hematology ROS (+)   Anesthesia Other Findings Past Medical History: No date: Ankle fracture, left No date: Anxiety No date: Cancer [facility])     Comment:  Uterine Cancer No date: Dyspepsia No date: Endometrial cancer (HCC) No date: GERD (gastroesophageal reflux disease) No date: History of kidney stones 10/16/2010: Hx of dysplastic nevus     Comment:  Right lower leg. Severe atypia. Excised: 11/25/2010.               Margins free No date: Hypercholesterolemia No date: Hyperlipidemia No date: Hypertension No date: Hypothyroidism No date: Kidney stone No date: Osteopenia 08/30/2019: Squamous cell carcinoma of skin     Comment:  Right temple. Well differentiated. Tx: [facility]  Past Surgical History: 12/07/2016: CATARACT EXTRACTION W/PHACO; Right     Comment:  Procedure: CATARACT EXTRACTION PHACO AND INTRAOCULAR               LENS PLACEMENT (IOC);  Surgeon: Birder Robson, MD;                Location: [facility] ORS;  Service: Ophthalmology;  Laterality:              Right;  [place] 42.5 AP% 23.2 CDE 9.87 Fluid pack lot #               [id number] 01/04/2017: CATARACT  EXTRACTION W/PHACO; Left     Comment:  Procedure: CATARACT EXTRACTION PHACO AND INTRAOCULAR               LENS PLACEMENT (IOC);  Surgeon: Birder Robson, MD;                Location: [facility] ORS;  Service: Ophthalmology;  Laterality:              Left;  [place] 39.2 AP% 23.9 CDE 9.38 Fluid Pack Lot #               [id number] 2014: CHOLECYSTECTOMY 2014: COLONOSCOPY 2004: COLONOSCOPY WITH ESOPHAGOGASTRODUODENOSCOPY (EGD) 11/07/2018: COLONOSCOPY WITH PROPOFOL; N/A     Comment:  Procedure: COLONOSCOPY WITH PROPOFOL;  Surgeon:               Lollie Sails, MD;  Location[facility] ENDOSCOPY;                Service: Endoscopy;  Laterality: N/A; No date: DILATION AND CURETTAGE OF UTERUS No date: EXTRACORPOREAL SHOCK WAVE LITHOTRIPSY No date: EYE SURGERY No date: GANGLION CYST EXCISION; Right     Comment:  wrist No date: KIDNEY STONE SURGERY     Comment:  several 2014: KNEE ARTHROSCOPY; Left     Comment:  partial medial meniscectomy, medial chondroplasty 2009: TOTAL ABDOMINAL  HYSTERECTOMY W/ BILATERAL SALPINGOOPHORECTOMY  BMI    Body Mass Index: 36.40 kg/m      Reproductive/Obstetrics negative OB ROS                             Anesthesia Physical Anesthesia Plan  ASA: 3  Anesthesia Plan: General LMA   Post-op Pain Management:    Induction: Intravenous  PONV Risk Score and Plan: Dexamethasone, Ondansetron, Midazolam and Treatment may vary due to age or medical condition  Airway Management Planned: LMA  Additional Equipment:   Intra-op Plan:   Post-operative Plan: Extubation in OR  Informed Consent: I have reviewed the patients History and Physical, chart, labs and discussed the procedure including the risks, benefits and alternatives for the proposed anesthesia with the patient or authorized representative who has indicated his/her understanding and acceptance.     Dental Advisory Given  Plan Discussed with: Anesthesiologist, CRNA and  Surgeon  Anesthesia Plan Comments: (Patient consented for risks of anesthesia including but not limited to:  - adverse reactions to medications - damage to eyes, teeth, lips or other oral mucosa - nerve damage due to positioning  - sore throat or hoarseness - Damage to heart, brain, nerves, lungs, other parts of body or loss of life  Patient voiced understanding.)        Anesthesia Quick Evaluation

## 2022-08-11 NOTE — Anesthesia Procedure Notes (Signed)
Procedure Name: LMA Insertion Date/Time: 08/11/2022 1:03 PM  Performed by: Jonna Clark, CRNAPre-anesthesia Checklist: Patient identified, Patient being monitored, Timeout performed, Emergency Drugs available and Suction available Patient Re-evaluated:Patient Re-evaluated prior to induction Oxygen Delivery Method: Circle system utilized Preoxygenation: Pre-oxygenation with 100% oxygen Induction Type: IV induction Ventilation: Mask ventilation without difficulty LMA: LMA inserted LMA Size: 4.0 Tube type: Oral Number of attempts: 1 Placement Confirmation: positive ETCO2 and breath sounds checked- equal and bilateral Tube secured with: Tape Dental Injury: Teeth and Oropharynx as per pre-operative assessment

## 2022-08-11 NOTE — Transfer of Care (Signed)
Immediate Anesthesia Transfer of Care Note  Patient: Morgan Dalton  Procedure(s) Performed: HALLUX VALGUS LAPIDUS (Left)  Patient Location: PACU  Anesthesia Type:General  Level of Consciousness: drowsy and patient cooperative  Airway & Oxygen Therapy: Patient Spontanous Breathing  Post-op Assessment: Report given to RN and Post -op Vital signs reviewed and stable  Post vital signs: Reviewed and stable  Last Vitals:  Vitals Value Taken Time  BP 116/63 08/11/22 1500  Temp 36.2 C 08/11/22 1500  Pulse 53 08/11/22 1513  Resp 17 08/11/22 1513  SpO2 95 % 08/11/22 1513  Vitals shown include unvalidated device data.  Last Pain:  Vitals:   08/11/22 1202  TempSrc: Oral  PainSc: 0-No pain         Complications: No notable events documented.

## 2022-08-11 NOTE — Discharge Instructions (Addendum)
[place]  POST OPERATIVE INSTRUCTIONS FOR DR. Vickki Muff AND DR. [place]ianz   Take your medication as prescribed.  Pain medication should be taken only as needed.  Keep the dressing clean, dry and intact.  Keep your foot elevated above the heart level for the first 48 hours.  We have instructed you to be non-weight bearing.  Always wear your post-op shoe when walking.  Always use your crutches if you are to be non-weight bearing.  Do not take a shower. Baths are permissible as long as the foot is kept out of the water.   Every hour you are awake:  Bend your knee 15 times.  Call [facility] [phone]) if any of the following problems occur: You develop a temperature or fever. The bandage becomes saturated with blood. Medication does not stop your pain. Injury of the foot occurs. Any symptoms of infection including redness, odor, or red streaks running from wound.        AMBULATORY SURGERY  DISCHARGE INSTRUCTIONS   The drugs that you were given will stay in your system until tomorrow so for the next 24 hours you should not:  Drive an automobile Make any legal decisions Drink any alcoholic beverage   You may resume regular meals tomorrow.  Today it is better to start with liquids and gradually work up to solid foods.  You may eat anything you prefer, but it is better to start with liquids, then soup and crackers, and gradually work up to solid foods.   Please notify your doctor immediately if you have any unusual bleeding, trouble breathing, redness and pain at the surgery site, drainage, fever, or pain not relieved by medication.     Your post-operative visit with Dr.                                       is: Date:                        Time:    Please call to schedule your post-operative visit.  Additional Instructions:

## 2022-08-11 NOTE — H&P (Signed)
HISTORY AND PHYSICAL INTERVAL NOTE:  08/11/2022  12:13 PM  Morgan Dalton  has presented today for surgery, with the diagnosis of M20.12 - Hallux valgus of left foot.  The various methods of treatment have been discussed with the patient.  No guarantees were given.  After consideration of risks, benefits and other options for treatment, the patient has consented to surgery.  I have reviewed the patients' chart and labs.     A history and physical examination was performed in my office.  The patient was reexamined.  There have been no changes to this history and physical examination.  Morgan Dalton A

## 2022-08-12 ENCOUNTER — Encounter: Payer: Self-pay | Admitting: Podiatry

## 2022-08-12 NOTE — Anesthesia Postprocedure Evaluation (Signed)
Anesthesia Post Note  Patient: Morgan Dalton  Procedure(s) Performed: HALLUX VALGUS LAPIDUS (Left)  Patient location during evaluation: PACU Anesthesia Type: General Level of consciousness: awake and alert Pain management: pain level controlled Vital Signs Assessment: post-procedure vital signs reviewed and stable Respiratory status: spontaneous breathing, nonlabored ventilation, respiratory function stable and patient connected to nasal cannula oxygen Cardiovascular status: blood pressure returned to baseline and stable Postop Assessment: no apparent nausea or vomiting Anesthetic complications: no   No notable events documented.   Last Vitals:  Vitals:   08/11/22 1615 08/11/22 1652  BP: (!) 146/78 (!) 154/77  Pulse: 92 95  Resp: 15 14  Temp: (!) 36.3 C 36.4 C  SpO2: 95% 98%    Last Pain:  Vitals:   08/11/22 1653  TempSrc:   PainSc: 0-No pain                 Precious Haws Mychael Soots

## 2022-10-12 ENCOUNTER — Other Ambulatory Visit: Payer: Self-pay

## 2022-10-12 MED ORDER — COVID-19 MRNA 2023-2024 VACCINE (COMIRNATY) 0.3 ML INJECTION
0.3000 mL | Freq: Once | INTRAMUSCULAR | 0 refills | Status: AC
  Filled 2022-10-12: qty 0.3, 1d supply, fill #0

## 2023-01-21 ENCOUNTER — Emergency Department: Payer: Medicare Other

## 2023-01-21 ENCOUNTER — Emergency Department
Admission: EM | Admit: 2023-01-21 | Discharge: 2023-01-21 | Disposition: A | Discharge status: Home / Self Care | Payer: Medicare Other | Attending: Emergency Medicine | Admitting: Emergency Medicine

## 2023-01-21 ENCOUNTER — Other Ambulatory Visit: Payer: Self-pay

## 2023-01-21 DIAGNOSIS — Z8542 Personal history of malignant neoplasm of other parts of uterus: Secondary | ICD-10-CM | POA: Insufficient documentation

## 2023-01-21 DIAGNOSIS — K5792 Diverticulitis of intestine, part unspecified, without perforation or abscess without bleeding: Secondary | ICD-10-CM | POA: Diagnosis not present

## 2023-01-21 DIAGNOSIS — I10 Essential (primary) hypertension: Secondary | ICD-10-CM | POA: Insufficient documentation

## 2023-01-21 DIAGNOSIS — N9489 Other specified conditions associated with female genital organs and menstrual cycle: Secondary | ICD-10-CM

## 2023-01-21 DIAGNOSIS — R1031 Right lower quadrant pain: Secondary | ICD-10-CM | POA: Diagnosis present

## 2023-01-21 LAB — URINALYSIS, ROUTINE W REFLEX MICROSCOPIC
Bilirubin Urine: NEGATIVE
Glucose, UA: NEGATIVE mg/dL
Hgb urine dipstick: NEGATIVE
Ketones, ur: NEGATIVE mg/dL
Nitrite: NEGATIVE
Protein, ur: 100 mg/dL — AB
RBC / HPF: >50 RBC/hpf (ref 0–5)
Specific Gravity, Urine: 1.021 (ref 1.005–1.030)
Squamous Epithelial / HPF: NONE SEEN /HPF (ref 0–5)
WBC, UA: >50 WBC/hpf (ref 0–5)
pH: 5 (ref 5.0–8.0)

## 2023-01-21 LAB — LIPASE, BLOOD: Lipase: 29 U/L (ref 11–51)

## 2023-01-21 LAB — COMPREHENSIVE METABOLIC PANEL
ALT: 31 U/L (ref 0–44)
AST: 30 U/L (ref 15–41)
Albumin: 3.7 g/dL (ref 3.5–5.0)
Alkaline Phosphatase: 103 U/L (ref 38–126)
Anion gap: 8 (ref 5–15)
BUN: 15 mg/dL (ref 8–23)
CO2: 24 mmol/L (ref 22–32)
Calcium: 8.9 mg/dL (ref 8.9–10.3)
Chloride: 105 mmol/L (ref 98–111)
Creatinine, Ser: 0.9 mg/dL (ref 0.44–1.00)
GFR, Estimated: >60 mL/min (ref >60)
Glucose, Bld: 115 mg/dL — ABNORMAL HIGH (ref 70–99)
Potassium: 4.2 mmol/L (ref 3.5–5.1)
Sodium: 137 mmol/L (ref 135–145)
Total Bilirubin: 0.8 mg/dL (ref 0.3–1.2)
Total Protein: 7.5 g/dL (ref 6.5–8.1)

## 2023-01-21 LAB — CBC
HCT: 39.7 % (ref 36.0–46.0)
Hemoglobin: 12.6 g/dL (ref 12.0–15.0)
MCH: 28.4 pg (ref 26.0–34.0)
MCHC: 31.7 g/dL (ref 30.0–36.0)
MCV: 89.6 fL (ref 80.0–100.0)
Platelets: 432 10*3/uL — ABNORMAL HIGH (ref 150–400)
RBC: 4.43 MIL/uL (ref 3.87–5.11)
RDW: 14.3 % (ref 11.5–15.5)
WBC: 13.4 10*3/uL — ABNORMAL HIGH (ref 4.0–10.5)
nRBC: 0 % (ref 0.0–0.2)

## 2023-01-21 MED ORDER — ONDANSETRON 4 MG PO TBDP: 4.0000 mg | ORAL_TABLET | Freq: Three times a day (TID) | ORAL | 0 refills | Status: DC | PRN

## 2023-01-21 MED ORDER — SODIUM CHLORIDE 0.9 % IV SOLN
2.0000 g | Freq: Once | INTRAVENOUS | Status: DC
  Filled 2023-01-21: qty 20

## 2023-01-21 MED ORDER — LEVOFLOXACIN 750 MG PO TABS: 750.0000 mg | ORAL_TABLET | Freq: Every day | ORAL | 0 refills | Status: AC

## 2023-01-21 MED ORDER — MORPHINE SULFATE (PF) 4 MG/ML IV SOLN
4.0000 mg | Freq: Once | INTRAVENOUS | Status: DC
  Filled 2023-01-21: qty 1

## 2023-01-21 MED ORDER — SODIUM CHLORIDE 0.9 % IV BOLUS
1000.0000 mL | Freq: Once | INTRAVENOUS | Status: AC
Start: 2023-01-21 — End: 2023-01-21
  Administered 2023-01-21: 1000 mL via INTRAVENOUS

## 2023-01-21 MED ORDER — METRONIDAZOLE 500 MG/100ML IV SOLN: 500.0000 mg | Freq: Once | INTRAVENOUS | Status: DC

## 2023-01-21 MED ORDER — IOHEXOL 300 MG/ML  SOLN
100.0000 mL | Freq: Once | INTRAMUSCULAR | Status: AC | PRN
  Administered 2023-01-21: 100 mL via INTRAVENOUS

## 2023-01-21 MED ORDER — LEVOFLOXACIN IN D5W 750 MG/150ML IV SOLN
750.0000 mg | Freq: Once | INTRAVENOUS | Status: AC
  Administered 2023-01-21: 750 mg via INTRAVENOUS
  Filled 2023-01-21: qty 150

## 2023-01-21 MED ORDER — ONDANSETRON HCL 4 MG/2ML IJ SOLN
4.0000 mg | Freq: Once | INTRAMUSCULAR | Status: DC
  Filled 2023-01-21: qty 2

## 2023-01-21 NOTE — ED Provider Notes (Signed)
[facility] Provider Note    Event Date/Time   First MD Initiated Contact with Patient 01/21/23 1037     (approximate)   History   Chief Complaint: Abdominal Pain   HPI  Morgan Dalton is a 72 y.o. female with a history of hypertension, GERD, endometrial cancer status post hysterectomy who comes to the ED complaining of right sided abdominal pain with loss of appetite and nausea for the past 2 days, waxing and waning, worse at night and associated with night sweats.  No vomiting.  No diarrhea or constipation.  No chest pain or shortness of breath.  Has had prior cholecystectomy.     Physical Exam   Triage Vital Signs: ED Triage Vitals  Enc Vitals Group     BP 01/21/23 0951 130/79     Pulse Rate 01/21/23 0951 87     Resp --      Temp 01/21/23 0951 97.9 F (36.6 C)     Temp Source 01/21/23 0951 Oral     SpO2 01/21/23 0951 99 %     Weight 01/21/23 0951 197 lb (89.4 kg)     Height 01/21/23 0951 5' 4"$  (1.626 m)     Head Circumference --      Peak Flow --      Pain Score 01/21/23 0950 4     Pain Loc --      Pain Edu? --      Excl. in [place]? --     Most recent vital signs: Vitals:   01/21/23 0951 01/21/23 1037  BP: 130/79 (!) 153/80  Pulse: 87 92  Temp: 97.9 F (36.6 C)   SpO2: 99% 97%    General: Awake, no distress.  CV:  Good peripheral perfusion.  Resp:  Normal effort.  Abd:  No distention.  Soft with right mid abdominal and right lower quadrant tenderness Other:  Moist oral mucosa.  No rash   ED Results / Procedures / Treatments   Labs (all labs ordered are listed, but only abnormal results are displayed) Labs Reviewed  COMPREHENSIVE METABOLIC PANEL - Abnormal; Notable for the following components:      Result Value   Glucose, Bld 115 (*)    All other components within normal limits  CBC - Abnormal; Notable for the following components:   WBC 13.4 (*)    Platelets 432 (*)    All other components within normal limits   URINALYSIS, ROUTINE W REFLEX MICROSCOPIC - Abnormal; Notable for the following components:   Color, Urine AMBER (*)    APPearance CLOUDY (*)    Protein, ur 100 (*)    Leukocytes,Ua LARGE (*)    Bacteria, UA FEW (*)    All other components within normal limits  LIPASE, BLOOD     EKG    RADIOLOGY CT abdomen pelvis interpreted by me, no obstruction, no hernia.  Radiology report reviewed noting diverticulitis with microperforation at the hepatic flexure, no intra-abdominal abscess.   PROCEDURES:  Procedures   MEDICATIONS ORDERED IN ED: Medications  ondansetron (ZOFRAN) injection 4 mg (4 mg Intravenous Not Given 01/21/23 1111)  levofloxacin (LEVAQUIN) IVPB 750 mg (has no administration in time range)  sodium chloride 0.9 % bolus 1,000 mL (1,000 mLs Intravenous New Bag/Given 01/21/23 1117)  iohexol (OMNIPAQUE) 300 MG/ML solution 100 mL (100 mLs Intravenous Contrast Given 01/21/23 1151)     IMPRESSION / MDM / ASSESSMENT AND PLAN / ED COURSE  I reviewed the triage vital signs and the  nursing notes.  DDx: Appendicitis, diverticulitis, intra-abdominal abscess, cystitis, ureterolithiasis  Patient's presentation is most consistent with acute presentation with potential threat to life or bodily function.  Patient presents with 3 days of right-sided abdominal pain, has tenderness on exam.  No peritonitis.  Vital signs are normal.  Will obtain labs and CT scan.  Will give IV fluids, IV Zofran for hydration and nausea relief   ----------------------------------------- 12:56 PM on 01/21/2023 ----------------------------------------- CT scan shows diverticulitis at the hepatic flexure with microperforation.  Abdomen is soft.  Vital signs are normal.  Patient is calm and comfortable.  She declined Zofran earlier because she was not feeling significantly nauseated.  Recommended admission for IV antibiotics and symptom monitoring, but patient strongly prefers to try outpatient therapy.  She is a  retired Marine scientist, is medically savvy and very reliable.  She reiterates that her symptoms are very minimal, and she is tolerating oral intake without vomiting or diarrhea.  Oral antibiotic should have good absorption and bioavailability, so I think this is reasonable.  She understands that she needs to return to the hospital immediately if she has worsening symptoms, and she needs to return if symptoms have not resolved after 1 week of oral antibiotics.  She recently completed a course of Augmentin for sinus infection, so I will treat with Levaquin.      FINAL CLINICAL IMPRESSION(S) / ED DIAGNOSES   Final diagnoses:  Acute diverticulitis     Rx / DC Orders   ED Discharge Orders          Ordered    levofloxacin (LEVAQUIN) 750 MG tablet  Daily        01/21/23 1254    ondansetron (ZOFRAN-ODT) 4 MG disintegrating tablet  Every 8 hours PRN        01/21/23 1254             Note:  This document was prepared using Dragon voice recognition software and may include unintentional dictation errors.   Carrie Mew, MD 01/21/23 1259

## 2023-01-21 NOTE — ED Triage Notes (Signed)
Pt to ED via POV from [facility]. Pt sent due to RUQ abdominal pain with guarding. Pt reports loss of appetite and nauseas x2 days. Pain 4/10. Pt sent for CT scan.

## 2023-01-21 NOTE — Discharge Instructions (Addendum)
Take Levaquin as prescribed for the next 7 days to treat the diverticulitis.  If you have worsening symptoms including fever, vomiting, severe pain, abdominal bloating, please return to the emergency department right away.  Keep your appointment with gynecology on Tuesday.  Be sure to discuss the incidental finding of a 3 cm pelvic mass seen on today's CT scan.

## 2023-01-24 ENCOUNTER — Other Ambulatory Visit: Payer: Self-pay | Admitting: Internal Medicine

## 2023-01-24 DIAGNOSIS — Z1231 Encounter for screening mammogram for malignant neoplasm of breast: Secondary | ICD-10-CM

## 2023-01-26 ENCOUNTER — Other Ambulatory Visit: Payer: Self-pay | Admitting: Obstetrics and Gynecology

## 2023-01-26 DIAGNOSIS — N9489 Other specified conditions associated with female genital organs and menstrual cycle: Secondary | ICD-10-CM

## 2023-01-26 DIAGNOSIS — K5792 Diverticulitis of intestine, part unspecified, without perforation or abscess without bleeding: Secondary | ICD-10-CM | POA: Insufficient documentation

## 2023-01-27 DIAGNOSIS — I10 Essential (primary) hypertension: Secondary | ICD-10-CM | POA: Insufficient documentation

## 2023-01-30 ENCOUNTER — Ambulatory Visit
Admission: RE | Admit: 2023-01-30 | Discharge: 2023-01-30 | Disposition: A | Discharge status: Home / Self Care | Payer: Medicare Other | Source: Ambulatory Visit | Attending: Obstetrics and Gynecology | Admitting: Obstetrics and Gynecology

## 2023-01-30 DIAGNOSIS — N9489 Other specified conditions associated with female genital organs and menstrual cycle: Secondary | ICD-10-CM | POA: Insufficient documentation

## 2023-01-30 MED ORDER — GADOBUTROL 1 MMOL/ML IV SOLN
9.0000 mL | Freq: Once | INTRAVENOUS | Status: AC | PRN
  Administered 2023-01-30: 10 mL via INTRAVENOUS

## 2023-02-07 HISTORY — PX: NM PET TUMOR IMAGING: HXRAD656

## 2023-02-09 ENCOUNTER — Inpatient Hospital Stay: Payer: Medicare Other | Attending: Obstetrics and Gynecology | Admitting: Obstetrics and Gynecology

## 2023-02-09 VITALS — BP 148/80 | HR 69 | Temp 98.9°F | Resp 20 | Wt 195.1 lb

## 2023-02-09 DIAGNOSIS — Z923 Personal history of irradiation: Secondary | ICD-10-CM | POA: Diagnosis not present

## 2023-02-09 DIAGNOSIS — Z79899 Other long term (current) drug therapy: Secondary | ICD-10-CM | POA: Diagnosis not present

## 2023-02-09 DIAGNOSIS — R978 Other abnormal tumor markers: Secondary | ICD-10-CM

## 2023-02-09 DIAGNOSIS — Z7982 Long term (current) use of aspirin: Secondary | ICD-10-CM | POA: Insufficient documentation

## 2023-02-09 DIAGNOSIS — Z87442 Personal history of urinary calculi: Secondary | ICD-10-CM | POA: Insufficient documentation

## 2023-02-09 DIAGNOSIS — K219 Gastro-esophageal reflux disease without esophagitis: Secondary | ICD-10-CM | POA: Insufficient documentation

## 2023-02-09 DIAGNOSIS — M8580 Other specified disorders of bone density and structure, unspecified site: Secondary | ICD-10-CM | POA: Insufficient documentation

## 2023-02-09 DIAGNOSIS — E785 Hyperlipidemia, unspecified: Secondary | ICD-10-CM | POA: Diagnosis not present

## 2023-02-09 DIAGNOSIS — F339 Major depressive disorder, recurrent, unspecified: Secondary | ICD-10-CM | POA: Diagnosis not present

## 2023-02-09 DIAGNOSIS — Z90710 Acquired absence of both cervix and uterus: Secondary | ICD-10-CM | POA: Insufficient documentation

## 2023-02-09 DIAGNOSIS — G4700 Insomnia, unspecified: Secondary | ICD-10-CM | POA: Diagnosis not present

## 2023-02-09 DIAGNOSIS — E039 Hypothyroidism, unspecified: Secondary | ICD-10-CM | POA: Diagnosis not present

## 2023-02-09 DIAGNOSIS — Z08 Encounter for follow-up examination after completed treatment for malignant neoplasm: Secondary | ICD-10-CM

## 2023-02-09 DIAGNOSIS — Z79890 Hormone replacement therapy (postmenopausal): Secondary | ICD-10-CM | POA: Diagnosis not present

## 2023-02-09 DIAGNOSIS — Z90721 Acquired absence of ovaries, unilateral: Secondary | ICD-10-CM | POA: Insufficient documentation

## 2023-02-09 DIAGNOSIS — Z8542 Personal history of malignant neoplasm of other parts of uterus: Secondary | ICD-10-CM

## 2023-02-09 DIAGNOSIS — N9489 Other specified conditions associated with female genital organs and menstrual cycle: Secondary | ICD-10-CM

## 2023-02-09 NOTE — Progress Notes (Signed)
Gynecologic Oncology Consult Visit   Referring Provider: Dr. Ouida Sills  Chief Complaint: Right adnexal mass; hx of endometrial cancer in 2009  Subjective:  Morgan Dalton is a 72 y.o. female who is seen in consultation from Dr. Ouida Sills with history of endometrial cancer in 2009 who presents for incidental right adnexal mass.   Patient went to ER for RUQ abdominal pain. CT 01/21/23 showed diverticulitis at hepatic flexure with some free air. Treated with antibiotics for diverticulitis.  Incidental finding of solid mass of right posterior adnexa measuring 3.2 x 2.2 cm, new since previous CT scan in 2020. She also has a large staghorn calculus in the right kidney.  She was seen by Dr. Ouida Sills in [place]. Asymptomatic now.  No vaginal discharge.  01/30/23- MRI Abdomen and Pelvis w wo contrast S/p hysterectomy. Left ovary not visualized. Solid heterogeneously enhancing right ovarian lesion measuring 3.0 x 1.9 x 2.3 cm. No ascites. No lymphadenopathy. Inflammation of proximal transverse colon consistent with residual diverticulitis.   HE4- 142 (elevated) CA 125 - 18 (normal) ROMA- 30.57% (high risk)  01/25/23- Pap- NILM  Last colonoscopy 11/07/2018- report reviewed and showed tortuous colon, diverticulosis, recommendation to repeat in 5 years (2024)  Patient provided [place] with her records from her endometrial cancer which were reviewed in detail, copied, and scanned into her chart.   She underwent vaginal hysterectomy with Dr Ouida Sills at [facility] in 2009. 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 She was referr to Dr. Sabra Heck at [facility] and underwent robotic assisted bilateral salpino-oophorectomy, bilateral pelvic lymph node dissection, supraumbilical laparotomy and periaortic lymph node dissection. Surgery complicated by adhesions. Final pathology revealed a deeply invasive (two thirds), well differentiated endometrioid adenocarcinoma, stage Ic. Washings  cervix and adnexa were negative. She elected for adjuvant vaginal brachytherapy at [facility] and completed treatment 02/02/2008.   Problem List: Patient Active Problem List   Diagnosis Date Noted   GERD (gastroesophageal reflux disease) 12/31/2016   History of nephrolithiasis 12/31/2016   Hyperlipidemia, unspecified 12/31/2016   Insomnia 12/31/2016   Osteopenia 12/31/2016   Acquired hypothyroidism 10/26/2016   Bladder neoplasm of uncertain malignant potential 09/09/2016   Bladder pain 08/11/2016   Chronic cystitis 08/11/2016   Mild episode of recurrent major depressive disorder ([place]) 07/23/2016   History of endometrial cancer [id number]   Renal colic [id number]   Right upper quadrant pain 08/07/2012   Kidney stone 08/07/2012   Gross hematuria 08/07/2012   Malignant neoplasm of corpus uteri ([place]) 08/07/2012   Microscopic hematuria 08/07/2012   Urinary tract infection 08/07/2012    Past Medical History: Past Medical History:  Diagnosis Date   Ankle fracture, left    Anxiety    Cancer ([place])    Uterine Cancer   Dyspepsia    Endometrial cancer ([place])    GERD (gastroesophageal reflux disease)    History of kidney stones    Hx of dysplastic nevus 10/16/2010   Right lower leg. Severe atypia. Excised: 11/25/2010. 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 Well differentiated. Tx: [facility]    Past Surgical History: Past Surgical History:  Procedure Laterality Date   CATARACT EXTRACTION W/PHACO Right 12/07/2016   Procedure: CATARACT EXTRACTION PHACO AND INTRAOCULAR LENS PLACEMENT (IOC);  Surgeon: Birder Robson, MD;  Location: [facility] ORS;  Service: Ophthalmology;  Laterality: Right;  [place] 42.5 AP% 23.2  CDE 9.87 Fluid pack lot # [id number]   CATARACT EXTRACTION W/PHACO Left 01/04/2017   Procedure: CATARACT EXTRACTION PHACO AND INTRAOCULAR LENS PLACEMENT  (IOC);  Surgeon: Birder Robson, MD;  Location: [facility] ORS;  Service: Ophthalmology;  Laterality: Left;  [place] 39.2 AP% 23.9 CDE 9.38 Fluid Pack Lot # [id number]   CHOLECYSTECTOMY  2014   COLONOSCOPY  2014   COLONOSCOPY WITH ESOPHAGOGASTRODUODENOSCOPY (EGD)  2004   COLONOSCOPY WITH PROPOFOL N/A 11/07/2018   Procedure: COLONOSCOPY WITH PROPOFOL;  Surgeon: Lollie Sails, MD;  Location: [facility] ENDOSCOPY;  Service: Endoscopy;  Laterality: N/A;   DILATION AND CURETTAGE OF UTERUS     EXTRACORPOREAL SHOCK WAVE LITHOTRIPSY     EYE SURGERY     GANGLION CYST EXCISION Right    wrist   HALLUX VALGUS LAPIDUS Left 08/11/2022   Procedure: HALLUX VALGUS LAPIDUS;  Surgeon: [name], DPM;  Location: [facility] ORS;  Service: Podiatry;  Laterality: Left;   KIDNEY STONE SURGERY     several   KNEE ARTHROSCOPY Left 2014   partial medial meniscectomy, medial chondroplasty   TOTAL ABDOMINAL HYSTERECTOMY W/ BILATERAL SALPINGOOPHORECTOMY  2009    Past Gynecologic History:  G2P2 Post menopausal Menarche: age 73  OB History:  OB History  No obstetric history on file.    Family History: Family History  Problem Relation Age of Onset   Breast cancer Paternal Aunt     Social History: Social History   Socioeconomic History   Marital status: Married    Spouse name: Herbie Baltimore   Number of children: 2   Years of education: Not on file   Highest education level: Not on file  Occupational History   Not on file  Tobacco Use   Smoking status: Never   Smokeless tobacco: Never  Vaping Use   Vaping Use: Never used  Substance and Sexual Activity   Alcohol use: No   Drug use: No   Sexual activity: Not on file  Other Topics Concern   Not on file  Social History Narrative   Not on file   Social Determinants of Health   Financial Resource Strain: Not on file  Food Insecurity: Not on file  Transportation Needs: Not on file  Physical Activity: Not on file  Stress: Not on file  Social Connections: Not  on file  Intimate Partner Violence: Not on file    Allergies: Allergies  Allergen Reactions   Statins Other (See Comments)    Myalgia     Current Medications: Current Outpatient Medications  Medication Sig Dispense Refill   aspirin EC 81 MG tablet Take 81 mg by mouth daily.      cholecalciferol (VITAMIN D3) 10 MCG (400 UNIT) TABS tablet Take 1,000 Units by mouth daily.     Coenzyme Q10 (CO Q-10) 100 MG CAPS Take 100 mg by mouth daily.     Cranberry 400 MG CAPS Take 1 capsule by mouth daily.     ezetimibe (ZETIA) 10 MG tablet Take 10 mg by mouth daily.     hydrochlorothiazide (HYDRODIURIL) 25 MG tablet Take 12.5 mg by mouth daily.     Krill Oil 500 MG CAPS Take 500 mg by mouth daily.     levothyroxine (SYNTHROID, LEVOTHROID) 25 MCG tablet Take 25 mcg by mouth daily before breakfast.     losartan (COZAAR) 50 MG tablet Take 1 tablet by mouth daily.     Multiple Vitamins tablet Take 1 tablet by mouth daily.     pantoprazole (PROTONIX) 40 MG tablet  Take 40 mg by mouth every other day.     rosuvastatin (CRESTOR) 5 MG tablet Take 5 mg by mouth once a week. Thursday night     sertraline (ZOLOFT) 50 MG tablet Take 50 mg by mouth daily.     Glucosamine-Chondroitin 750-600 MG TABS Take 1 tablet by mouth daily.     ondansetron (ZOFRAN-ODT) 4 MG disintegrating tablet Take 1 tablet (4 mg total) by mouth every 8 (eight) hours as needed for nausea or vomiting. (Patient not taking: Reported on 02/09/2023) 20 tablet 0   oxyCODONE-acetaminophen (PERCOCET) 5-325 MG tablet Take 1-2 tablets by mouth every 6 (six) hours as needed for severe pain. Max 6 tabs per day (Patient not taking: Reported on 02/09/2023) 30 tablet 0   No current facility-administered medications for this visit.    General: negative for fevers, changes in weight or night sweats Skin: negative for changes in moles or sores or rash Eyes: negative for changes in vision HEENT: negative for change in hearing, tinnitus, voice  changes Pulmonary: negative for dyspnea, orthopnea, productive cough, wheezing Cardiac: negative for palpitations, pain Gastrointestinal: negative for nausea, vomiting, constipation, diarrhea, hematemesis, hematochezia Genitourinary/Sexual: negative for dysuria, retention, incontinence. Microscopic hematuria Ob/Gyn:  negative for abnormal bleeding, or pain Musculoskeletal: negative for pain, joint pain, back pain Hematology: negative for easy bruising, abnormal bleeding Neurologic/Psych: negative for headaches, seizures, paralysis, weakness, numbness   Objective:  Physical Examination:  BP (!) 148/80   Pulse 69   Temp 98.9 F (37.2 C)   Resp 20   Wt 195 lb 1.6 oz (88.5 kg)   SpO2 100%   BMI 33.49 kg/m     ECOG Performance Status: 0 - Asymptomatic  GENERAL: Patient is a well appearing female in no acute distress HEENT:  Sclera clear. Anicteric NODES:  Negative axillary, supraclavicular, inguinal lymph node survery LUNGS:  Clear to auscultation bilaterally.   HEART:  Regular rate and rhythm.  ABDOMEN:  Soft, nontender.  No hernias, incisions well healed. No masses or ascites EXTREMITIES:  No peripheral edema. Atraumatic. No cyanosis SKIN:  Clear with no obvious rashes or skin changes.  NEURO:  Nonfocal. Well oriented.  Appropriate affect.   Pelvic: Exam chaperoned by NP EGBUS: no lesions Cervix, uterus, ovaries: surgically absent Vagina: very atrophic with adhesions, some of which were broken down manually with a finger.  Adnexa: no palpable masses Rectovaginal: I can feel the small mass on the right and it is smooth and adjacent to the rectum.   Lab Review Labs on site today: No labs on site today  Radiologic Imaging: Per hpi    Assessment:  CLAUDELLE SCHOLLE is a 72 y.o. 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 Presented to  the ED 01/21/23 with RUQ pain and CT scan showed diverticulitis in the hepatic flexure with some small amounts of free air and treated with antibiotics. CT scan showed 3 cm heterogeneous mass in right posterior pelvis and reported to be ovarian mass.  CA125 normal, HE4 elevated.      Medical co-morbidities complicating care: diverticulitis, hypothyroid.  Plan:   Problem List Items Addressed This Visit   None Visit Diagnoses     Adnexal mass    -  Primary   Relevant Orders   NM PET Image Initial (PI) Skull Base To Thigh   Elevated tumor markers       Relevant Orders  NM PET Image Initial (PI) Skull Base To Thigh   Encounter for follow-up surveillance of endometrial cancer       Relevant Orders   NM PET Image Initial (PI) Skull Base To Thigh      I reviewed the CT and MRI scans with Dr Candise Che in Radiology. Informed him that the right ovary and tube were removed.  He thinks the right pelvic mass is arising from the right vaginal fornix and is not a colon diverticulum or related to the appendix.  This in combination with the severe radiation effect observed in the vagina, suggests that she had a microdehiscence of the vaginal cuff with RT and that this is a collection of debris.  On review of the CT from 2020 the radiologist thinks this was present then, although smaller, about 1.7 cm.  He suggested a PET scan for further reassurance that this is not malignant.  Could do a more aggressive vaginal exam after the PET to look for a sinus tract.  Could also consider colonoscopy or flex sig to rule out diverticular mass.   HE4 elevation likely related to inflammation or kidney disease.   Suggested return to clinic in 2 weeks after PET.   The patient's diagnosis, an outline of the further diagnostic and laboratory studies which will be required, the recommendation for surgery, and alternatives were discussed with her and her accompanying family members.  All questions were answered to their  satisfaction.  A total of 60 minutes were spent with the patient/family today; 60% was spent in education, counseling and coordination of care for pelvic mass.    Verlon Au, NP    CC:  Schermerhorn, Gwen Her, MD [address] [facility] [place],  [place] [address] [phone]

## 2023-02-10 ENCOUNTER — Encounter
Admission: RE | Admit: 2023-02-10 | Discharge: 2023-02-10 | Disposition: A | Discharge status: Home / Self Care | Payer: Medicare Other | Source: Ambulatory Visit | Attending: Nurse Practitioner | Admitting: Nurse Practitioner

## 2023-02-10 DIAGNOSIS — R978 Other abnormal tumor markers: Secondary | ICD-10-CM | POA: Diagnosis present

## 2023-02-10 DIAGNOSIS — Z08 Encounter for follow-up examination after completed treatment for malignant neoplasm: Secondary | ICD-10-CM

## 2023-02-10 DIAGNOSIS — N9489 Other specified conditions associated with female genital organs and menstrual cycle: Secondary | ICD-10-CM | POA: Insufficient documentation

## 2023-02-10 DIAGNOSIS — Z8542 Personal history of malignant neoplasm of other parts of uterus: Secondary | ICD-10-CM | POA: Diagnosis present

## 2023-02-10 LAB — GLUCOSE, CAPILLARY: Glucose-Capillary: 106 mg/dL — ABNORMAL HIGH (ref 70–99)

## 2023-02-10 MED ORDER — FLUDEOXYGLUCOSE F - 18 (FDG) INJECTION
10.1000 | Freq: Once | INTRAVENOUS | Status: AC | PRN
  Administered 2023-02-10: 10.63 via INTRAVENOUS

## 2023-02-16 ENCOUNTER — Inpatient Hospital Stay: Payer: Medicare Other | Attending: Obstetrics and Gynecology | Admitting: Obstetrics and Gynecology

## 2023-02-16 VITALS — BP 152/84 | HR 63 | Temp 97.0°F | Resp 17 | Wt 196.9 lb

## 2023-02-16 DIAGNOSIS — Z923 Personal history of irradiation: Secondary | ICD-10-CM | POA: Insufficient documentation

## 2023-02-16 DIAGNOSIS — R978 Other abnormal tumor markers: Secondary | ICD-10-CM | POA: Diagnosis not present

## 2023-02-16 DIAGNOSIS — Z8542 Personal history of malignant neoplasm of other parts of uterus: Secondary | ICD-10-CM | POA: Diagnosis not present

## 2023-02-16 DIAGNOSIS — Z90710 Acquired absence of both cervix and uterus: Secondary | ICD-10-CM | POA: Insufficient documentation

## 2023-02-16 DIAGNOSIS — R1900 Intra-abdominal and pelvic swelling, mass and lump, unspecified site: Secondary | ICD-10-CM | POA: Insufficient documentation

## 2023-02-16 DIAGNOSIS — N9489 Other specified conditions associated with female genital organs and menstrual cycle: Secondary | ICD-10-CM

## 2023-02-16 DIAGNOSIS — Z90722 Acquired absence of ovaries, bilateral: Secondary | ICD-10-CM | POA: Diagnosis not present

## 2023-02-16 NOTE — Progress Notes (Signed)
Gynecologic Oncology Consult Visit   Referring Provider: Dr. Ouida Sills  Chief Complaint: Right pelvic mass; hx of endometrial cancer in 2009  Subjective:  Morgan Dalton is a 72 y.o. female who is seen in consultation from Dr. Caryl Comes with history of endometrial cancer in 2009 who presents for discussion of imaging for incidental right pelvic mass.   At last visit, MRI was reviewed with radiology. MRI was addended to reflect that mass was present on CT from 2020 and measured 1.6 x 1.2 cm at that time. No definite communication with the colon or small bowel loops and the appendix is identified as separate from this lesion.   In interim, patient underwent PET scan.   02/10/23- PET IMPRESSION: 1. Hypermetabolic solid mass in the RIGHT pelvis consistent with ovarian neoplasm. 2. No evidence of metastatic adenopathy in the pelvis or periaortic retroperitoneum. 3. No evidence of metastatic disease outside the pelvis. 4. Post hysterectomy anatomy. No abnormal metabolic activity associated with the vagina. 5. RIGHT renal staghorn calculus.  Imaging was reviewed by Ander Purpura, NP and Dr. Leonia Reeves. Report was addended to reflect that based on her prior surgical hysterectomy and oophorectomy, intensely hypermetabolic solid mass is favored to be a neoplastic deposit, potentially from remote endometrial carcinoma.   She presents today for discussion of management options.   Gynecologic History:  Patient went to ER for RUQ abdominal pain. CT 01/21/23 showed diverticulitis at hepatic flexure with some free air. Treated with antibiotics for diverticulitis.  Incidental finding of solid mass of right posterior adnexa measuring 3.2 x 2.2 cm, new since previous CT scan in 2020. She also has a large staghorn calculus in the right kidney.  She was seen by Dr. Ouida Sills in [place]. Asymptomatic now.  No vaginal discharge.  01/30/23- MRI Abdomen and Pelvis w wo contrast S/p hysterectomy. Left ovary not visualized. Solid  heterogeneously enhancing right ovarian lesion measuring 3.0 x 1.9 x 2.3 cm. No ascites. No lymphadenopathy. Inflammation of proximal transverse colon consistent with residual diverticulitis.   HE4- 142 (elevated) CA 125 - 18 (normal) ROMA- 30.57% (high risk)  01/25/23- Pap- NILM  Last colonoscopy 11/07/2018- report reviewed and showed tortuous colon, diverticulosis, recommendation to repeat in 5 years (2024)  Patient provided [place] with her records from her endometrial cancer which were reviewed in detail, copied, and scanned into her chart.   She underwent vaginal hysterectomy with Dr Ouida Sills at [facility] in 2009. 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 She was referr to Dr. Sabra Heck at [facility] and underwent robotic assisted bilateral salpino-oophorectomy, bilateral pelvic lymph node dissection, supraumbilical laparotomy and periaortic lymph node dissection. Surgery complicated by adhesions. Final pathology revealed a deeply invasive (two thirds), well differentiated endometrioid adenocarcinoma, stage Ic. Washings cervix and adnexa were negative. She elected for adjuvant vaginal brachytherapy at [facility] and completed treatment 02/02/2008.   Problem List: Patient Active Problem List   Diagnosis Date Noted   GERD (gastroesophageal reflux disease) 12/31/2016   History of nephrolithiasis 12/31/2016   Hyperlipidemia, unspecified 12/31/2016   Insomnia 12/31/2016   Osteopenia 12/31/2016   Acquired hypothyroidism 10/26/2016   Bladder neoplasm of uncertain malignant potential 09/09/2016   Bladder pain 08/11/2016   Chronic cystitis 08/11/2016   Mild episode of recurrent major depressive disorder ([place]) 07/23/2016   History of endometrial cancer [id number]   Renal colic [id number]   Right upper quadrant pain 08/07/2012   Kidney stone 08/07/2012   Gross hematuria 08/07/2012   Malignant neoplasm of corpus uteri ([place]) 08/07/2012   Microscopic hematuria  08/07/2012    Urinary tract infection 08/07/2012    Past Medical History: Past Medical History:  Diagnosis Date   Ankle fracture, left    Anxiety    Cancer ([place])    Uterine Cancer   Dyspepsia    Endometrial cancer ([place])    GERD (gastroesophageal reflux disease)    History of kidney stones    Hx of dysplastic nevus 10/16/2010   Right lower leg. Severe atypia. Excised: 11/25/2010. 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 Well differentiated. Tx: [facility]    Past Surgical History: Past Surgical History:  Procedure Laterality Date   CATARACT EXTRACTION W/PHACO Right 12/07/2016   Procedure: CATARACT EXTRACTION PHACO AND INTRAOCULAR LENS PLACEMENT (IOC);  Surgeon: Birder Robson, MD;  Location: [facility] ORS;  Service: Ophthalmology;  Laterality: Right;  [place] 42.5 AP% 23.2 CDE 9.87 Fluid pack lot # [id number]   CATARACT EXTRACTION W/PHACO Left 01/04/2017   Procedure: CATARACT EXTRACTION PHACO AND INTRAOCULAR LENS PLACEMENT (IOC);  Surgeon: Birder Robson, MD;  Location: [facility] ORS;  Service: Ophthalmology;  Laterality: Left;  [place] 39.2 AP% 23.9 CDE 9.38 Fluid Pack Lot # [id number]   CHOLECYSTECTOMY  2014   COLONOSCOPY  2014   COLONOSCOPY WITH ESOPHAGOGASTRODUODENOSCOPY (EGD)  2004   COLONOSCOPY WITH PROPOFOL N/A 11/07/2018   Procedure: COLONOSCOPY WITH PROPOFOL;  Surgeon: Lollie Sails, MD;  Location: [facility] ENDOSCOPY;  Service: Endoscopy;  Laterality: N/A;   DILATION AND CURETTAGE OF UTERUS     EXTRACORPOREAL SHOCK WAVE LITHOTRIPSY     EYE SURGERY     GANGLION CYST EXCISION Right    wrist   HALLUX VALGUS LAPIDUS Left 08/11/2022   Procedure: HALLUX VALGUS LAPIDUS;  Surgeon: [name], DPM;  Location: [facility] ORS;  Service: Podiatry;  Laterality: Left;   KIDNEY STONE SURGERY     several   KNEE ARTHROSCOPY Left 2014   partial medial meniscectomy, medial  chondroplasty   TOTAL ABDOMINAL HYSTERECTOMY W/ BILATERAL SALPINGOOPHORECTOMY  2009    Past Gynecologic History:  G2P2 Post menopausal Menarche: age 49  OB History:  OB History  No obstetric history on file.    Family History: Family History  Problem Relation Age of Onset   Breast cancer Paternal Aunt     Social History: Social History   Socioeconomic History   Marital status: Married    Spouse name: Herbie [place]   Number of children: 2   Years of education: Not on file   Highest education level: Not on file  Occupational History   Not on file  Tobacco Use   Smoking status: Never   Smokeless tobacco: Never  Vaping Use   Vaping Use: Never used  Substance and Sexual Activity   Alcohol use: No   Drug use: No   Sexual activity: Not on file  Other Topics Concern   Not on file  Social History Narrative   Not on file   Social Determinants of Health   Financial Resource Strain: Not on file  Food Insecurity: Not on file  Transportation Needs: Not on file  Physical Activity: Not on file  Stress: Not on file  Social Connections: Not on file  Intimate Partner Violence: Not on file    Allergies: Allergies  Allergen Reactions   Statins Other (See Comments)    Myalgia     Current Medications:  Current Outpatient Medications  Medication Sig Dispense Refill   aspirin EC 81 MG tablet Take 81 mg by mouth daily.      cholecalciferol (VITAMIN D3) 10 MCG (400 UNIT) TABS tablet Take 1,000 Units by mouth daily.     Coenzyme Q10 (CO Q-10) 100 MG CAPS Take 100 mg by mouth daily.     Cranberry 400 MG CAPS Take 1 capsule by mouth daily.     ezetimibe (ZETIA) 10 MG tablet Take 10 mg by mouth daily.     Glucosamine-Chondroitin 750-600 MG TABS Take 1 tablet by mouth daily.     hydrochlorothiazide (HYDRODIURIL) 25 MG tablet Take 12.5 mg by mouth daily.     Krill Oil 500 MG CAPS Take 500 mg by mouth daily.     levothyroxine (SYNTHROID, LEVOTHROID) 25 MCG tablet Take 25 mcg by mouth  daily before breakfast.     losartan (COZAAR) 50 MG tablet Take 1 tablet by mouth daily.     Multiple Vitamins tablet Take 1 tablet by mouth daily.     pantoprazole (PROTONIX) 40 MG tablet Take 40 mg by mouth every other day.     rosuvastatin (CRESTOR) 5 MG tablet Take 5 mg by mouth once a week. Thursday night     sertraline (ZOLOFT) 50 MG tablet Take 50 mg by mouth daily.     ondansetron (ZOFRAN-ODT) 4 MG disintegrating tablet Take 1 tablet (4 mg total) by mouth every 8 (eight) hours as needed for nausea or vomiting. (Patient not taking: Reported on 02/09/2023) 20 tablet 0   oxyCODONE-acetaminophen (PERCOCET) 5-325 MG tablet Take 1-2 tablets by mouth every 6 (six) hours as needed for severe pain. Max 6 tabs per day (Patient not taking: Reported on 02/09/2023) 30 tablet 0   No current facility-administered medications for this visit.   Review of Systems General:  no complaints Skin: no complaints Eyes: no complaints HEENT: no complaints Breasts: no complaints Pulmonary: no complaints Cardiac: no complaints Gastrointestinal: no complaints Genitourinary/Sexual: spotting x 1 day after pelvic exam Ob/Gyn: no complaints Musculoskeletal: no complaints Hematology: no complaints Neurologic/Psych: no complaints  Objective:  Physical Examination:  BP (!) 152/84 Comment: rechecked manual/advised to f/u PCP  Pulse 63   Temp (!) 97 F (36.1 C) (Tympanic)   Resp 17   Wt 196 lb 14.4 oz (89.3 kg)   SpO2 100%   BMI 33.80 kg/m     ECOG Performance Status: 0 - Asymptomatic  GENERAL: Patient is a well appearing female in no acute distress HEENT:  Sclera clear. Anicteric NODES:  Negative axillary, supraclavicular, inguinal lymph node survery LUNGS:  Clear to auscultation bilaterally.   HEART:  Regular rate and rhythm.  ABDOMEN:  Soft, nontender.  No hernias, incisions well healed. No masses or ascites EXTREMITIES:  No peripheral edema. Atraumatic. No cyanosis SKIN:  Clear with no obvious  rashes or skin changes.  NEURO:  Nonfocal. Well oriented.  Appropriate affect.  Pelvic: Exam chaperoned by NP EGBUS: no lesions Cervix, uterus, ovaries: surgically absent Vagina: very atrophic with adhesions, some of which were broken down manually with a finger.  Adnexa: no palpable masses Rectovaginal: I can feel the small mass on the right and it is smooth and adjacent to the rectum.   Lab Review Labs on site today: No labs on site today  Radiologic Imaging: Per hpi    Assessment:  ZAKARA BEAIRD is a 72 y.o. 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 Presented to the ED 01/21/23 with RUQ pain and CT scan showed diverticulitis in the hepatic flexure with some small amounts of free air and treated with antibiotics. CT scan showed 3 cm heterogeneous mass in right posterior pelvis (reported to be ovarian mass, but ovaries removed previously).  CA125 normal, HE4 elevated.    MRI done for further characterization and all imaging reviewed with radiology.  They believe that the mass is attached to the right vaginal fornix and not to the colon or appendix and that it was present, but smaller in 2020.  PET scan ordered and shows that the right pelvic mass has a high SUV and is concerning for cancer.  Vaginal exam is notable for severe radiation changes with atrophy and adhesions. So it is difficult to observe the fornices.    Medical co-morbidities complicating care: diverticulitis, hypothyroid.  Plan:   Problem List Items Addressed This Visit   None Visit Diagnoses     Adnexal mass    -  Primary   Relevant Orders   CT GUIDED NEEDLE PLACEMENT      We will arrange for CT directed biopsy of right pelvic mass.  If this is found to be a recurrence of endometrial cancer or a new cancer, she would be a candidate for surgical resection.  This could  involve an exenterative procedure with resection of rectum and vagina and less likely bladder.  If biopsy is negative could consider exam under anesthesia to see if there is a fluid collection there than could be drained vaginally.  Could also consider colonoscopy or flex sig to rule out diverticular mass.   HE4 elevation likely related to inflammation or kidney disease.   Suggested return to clinic after biopsy.   The patient's diagnosis, an outline of the further diagnostic and laboratory studies which will be required, the recommendation for surgery, and alternatives were discussed with her and her accompanying family members.  All questions were answered to their satisfaction.    Verlon Au, NP  I personally interviewed and examined the patient. Agreed with the above/below plan of care. I have directly contributed to assessment and plan of care of this patient and educated and discussed with patient and family.  Mellody Drown, MD    CC:  Adin Hector, MD [facility] [place],  [place] [address] [phone]

## 2023-02-18 ENCOUNTER — Telehealth: Payer: Self-pay

## 2023-02-18 ENCOUNTER — Telehealth: Payer: Self-pay | Admitting: *Deleted

## 2023-02-18 NOTE — Telephone Encounter (Signed)
Called and spoke with Morgan Dalton. Biopsy has been scheduled for 03/01/23 with arrival time of 0730 for 0830 appointment. Do not eat or drink after midnight. Will require a driver. Read back performed.

## 2023-02-18 NOTE — Telephone Encounter (Signed)
I sent a secure chat to let Lauren know that pt is calling back as you had told her to do if she did not hear back from bx date with IR.

## 2023-02-26 NOTE — H&P (Incomplete)
Chief Complaint: Patient was seen in consultation today for pelvic mass at the request of [place]  Referring Physician(s): Lorain G  Supervising Physician: Juliet Rude  Patient Status: Morgan Dalton - Out-pt  History of Present Illness: Morgan Dalton is a 72 y.o. female who presented to [facility] ED 01/21/2023 complaining of right-sided abdominal pain with loss of appetite and nausea x 2 days.  CT AP at that time showed moderate acute diverticulitis with incidental finding of 3.2 cm solid mass in right posterior adnexa suspicious for ovarian neoplasm.  Patient has remote history of endometrial cancer (2009).  She was referred to oncology for further management.  Patient was referred to IR for right pelvic mass biopsy.  Imaging was reviewed by Dr. Kathlene Cote and approved for transgluteal approach pelvic mass biopsy.  Past Medical History:  Diagnosis Date   Ankle fracture, left    Anxiety    Cancer (HCC)    Uterine Cancer   Dyspepsia    Endometrial cancer (HCC)    GERD (gastroesophageal reflux disease)    History of kidney stones    Hx of dysplastic nevus 10/16/2010   Right lower leg. Severe atypia. Excised: 11/25/2010. 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 Well differentiated. Tx: [facility]    Past Surgical History:  Procedure Laterality Date   CATARACT EXTRACTION W/PHACO Right 12/07/2016   Procedure: CATARACT EXTRACTION PHACO AND INTRAOCULAR LENS PLACEMENT (IOC);  Surgeon: Birder Robson, MD;  Location: [facility] ORS;  Service: Ophthalmology;  Laterality: Right;  [place] 42.5 AP% 23.2 CDE 9.87 Fluid pack lot # [id number]   CATARACT EXTRACTION W/PHACO Left 01/04/2017   Procedure: CATARACT EXTRACTION PHACO AND INTRAOCULAR LENS PLACEMENT (IOC);  Surgeon: Birder Robson, MD;  Location: [facility] ORS;  Service: Ophthalmology;  Laterality: Left;  [place]  39.2 AP% 23.9 CDE 9.38 Fluid Pack Lot # [id number]   CHOLECYSTECTOMY  2014   COLONOSCOPY  2014   COLONOSCOPY WITH ESOPHAGOGASTRODUODENOSCOPY (EGD)  2004   COLONOSCOPY WITH PROPOFOL N/A 11/07/2018   Procedure: COLONOSCOPY WITH PROPOFOL;  Surgeon: Lollie Sails, MD;  Location: [facility] ENDOSCOPY;  Service: Endoscopy;  Laterality: N/A;   DILATION AND CURETTAGE OF UTERUS     EXTRACORPOREAL SHOCK WAVE LITHOTRIPSY     EYE SURGERY     GANGLION CYST EXCISION Right    wrist   HALLUX VALGUS LAPIDUS Left 08/11/2022   Procedure: HALLUX VALGUS LAPIDUS;  Surgeon: [name], DPM;  Location: [facility] ORS;  Service: Podiatry;  Laterality: Left;   KIDNEY STONE SURGERY     several   KNEE ARTHROSCOPY Left 2014   partial medial meniscectomy, medial chondroplasty   TOTAL ABDOMINAL HYSTERECTOMY W/ BILATERAL SALPINGOOPHORECTOMY  2009    Allergies: Statins  Medications: Prior to Admission medications   Medication Sig Start Date End Date Taking? Authorizing Provider  aspirin EC 81 MG tablet Take 81 mg by mouth daily.  08/07/12   [provider]  cholecalciferol (VITAMIN D3) 10 MCG (400 UNIT) TABS tablet Take 1,000 Units by mouth daily.    [provider]  Coenzyme Q10 (CO Q-10) 100 MG CAPS Take 100 mg by mouth daily.    [provider]  Cranberry 400 MG CAPS Take 1 capsule by mouth daily.    [provider]  ezetimibe (ZETIA) 10 MG tablet Take 10 mg by mouth  daily.    [provider]  Glucosamine-Chondroitin 750-600 MG TABS Take 1 tablet by mouth daily.    [provider]  hydrochlorothiazide (HYDRODIURIL) 25 MG tablet Take 12.5 mg by mouth daily.    [provider]  Javier Docker Oil 500 MG CAPS Take 500 mg by mouth daily.    [provider]  levothyroxine (SYNTHROID, LEVOTHROID) 25 MCG tablet Take 25 mcg by mouth daily before breakfast.    [provider]  losartan (COZAAR) 50 MG tablet Take 1 tablet by mouth daily. 12/31/22 12/31/23   [provider]  Multiple Vitamins tablet Take 1 tablet by mouth daily.    [provider]  ondansetron (ZOFRAN-ODT) 4 MG disintegrating tablet Take 1 tablet (4 mg total) by mouth every 8 (eight) hours as needed for nausea or vomiting. Patient not taking: Reported on 02/09/2023 01/21/23   Carrie Mew, MD  oxyCODONE-acetaminophen (PERCOCET) 5-325 MG tablet Take 1-2 tablets by mouth every 6 (six) hours as needed for severe pain. Max 6 tabs per day Patient not taking: Reported on 02/09/2023 08/11/22   [name], DPM  pantoprazole (PROTONIX) 40 MG tablet Take 40 mg by mouth every other day.    [provider]  rosuvastatin (CRESTOR) 5 MG tablet Take 5 mg by mouth once a week. Thursday night    [provider]  sertraline (ZOLOFT) 50 MG tablet Take 50 mg by mouth daily.    [provider]     Family History  Problem Relation Age of Onset   Breast cancer Paternal Aunt     Social History   Socioeconomic History   Marital status: Married    Spouse name: Herbie [place]   Number of children: 2   Years of education: Not on file   Highest education level: Not on file  Occupational History   Not on file  Tobacco Use   Smoking status: Never   Smokeless tobacco: Never  Vaping Use   Vaping Use: Never used  Substance and Sexual Activity   Alcohol use: No   Drug use: No   Sexual activity: Not on file  Other Topics Concern   Not on file  Social History Narrative   Not on file   Social Determinants of Health   Financial Resource Strain: Not on file  Food Insecurity: Not on file  Transportation Needs: Not on file  Physical Activity: Not on file  Stress: Not on file  Social Connections: Not on file    ECOG Status: {CHL ONC ECOG [id number]  Review of Systems: A 12 point ROS discussed and pertinent positives are indicated in the HPI above.  All other systems are negative.  Review of Systems  Vital Signs: There were no vitals taken for  this visit.  Advance Care Plan: {Advance Care [id number]    Physical Exam  Imaging: NM PET Image Initial (PI) Skull Base To Thigh  Addendum Date: 02/10/2023   ADDENDUM REPORT: 02/10/2023 15:22 ADDENDUM: Upon further review and conferring with nurse practitioner Antony Haste, history was provided of prior surgical hysterectomy oophorectomy. Therefore intensely hypermetabolic solid mass in the deep RIGHT pelvis is favored a neoplastic deposit potentially from remote endometrial carcinoma. Findings conveyed toLAUREN ALLEN on 02/10/2023  at15:21. Electronically Signed   By: Suzy Bouchard M.D.   On: 02/10/2023 15:22   Result Date: 02/10/2023 CLINICAL DATA:  Initial treatment strategy for cervical carcinoma. Adnexal mass. EXAM: NUCLEAR MEDICINE PET SKULL BASE TO THIGH TECHNIQUE: 10.6 mCi F-18 FDG was injected intravenously. Full-ring PET  imaging was performed from the skull base to thigh after the radiotracer. CT data was obtained and used for attenuation correction and anatomic localization. Fasting blood glucose: 106 mg/dl COMPARISON:  CT 01/21/2023 FINDINGS: Mediastinal blood pool activity: SUV max 2.5 Liver activity: SUV max NA NECK: No hypermetabolic lymph nodes in the neck. Incidental CT findings: None. CHEST: No hypermetabolic mediastinal or hilar nodes. No suspicious pulmonary nodules on the CT scan. Incidental CT findings: None. ABDOMEN/PELVIS: Within the deep RIGHT pelvis, oblong solid mass measuring 3.1 x 2.4 cm has intense metabolic activity SUV max equal 16.1. This is described as ovarian neoplasm on comparison CT and MRI. Post hysterectomy anatomy. No abnormal metabolic activity associated with the vagina. No hypermetabolic lymph nodes in the pelvis. No periaortic retroperitoneal nodal metastasis No liver metastasis No peritoneal disease Incidental CT findings: Bulky nonobstructing branching calculus in the RIGHT kidney. SKELETON: No focal hypermetabolic activity to suggest skeletal metastasis.  Incidental CT findings: None. IMPRESSION: 1. Hypermetabolic solid mass in the RIGHT pelvis consistent with ovarian neoplasm. 2. No evidence of metastatic adenopathy in the pelvis or periaortic retroperitoneum. 3. No evidence of metastatic disease outside the pelvis. 4. Post hysterectomy anatomy. No abnormal metabolic activity associated with the vagina. 5. RIGHT renal staghorn calculus. Electronically Signed: By: Suzy Bouchard M.D. On: 02/10/2023 12:14   MR ABDOMEN WWO CONTRAST  Addendum Date: 02/09/2023   ADDENDUM REPORT: 02/09/2023 11:03 ADDENDUM: I reviewed this patient's MRI examination, prior CT examination from 01/21/2023 and a remote CT scan from 08/13/2019 with Dr. Mellody Drown from [facility]. The solid 3 x 2 cm right lower pelvic lesion appears to be closely associated with the right vaginal fornix, best seen on the coronal MRI and CT images (CT series number 5, image numbers 51 through 55). Also, this lesion was present on the prior CT scan from 2020 (series 4, image 93 and series 2, image 73). At that time it measured 1.6 x 1.2 cm. Although it has enlarged its presence since 2020 would suggest a benign lesion. It may be some type of chronic inflammatory process in or adjacent to the vaginal fornix. I do not see any definite communication with the colon or small bowel loops and the appendix is identified and is separate from this lesion. Further evaluation considerations would include a follow-up MRI examination in 4-6 months, transvaginal ultrasound examination and potential transvaginal biopsy, if indicated. PET-CT would be a consideration also. Electronically Signed   By: Marijo Sanes M.D.   On: 02/09/2023 11:03   Result Date: 02/09/2023 CLINICAL DATA:  72 year old female with history of right adnexal mass noted on prior CT examination. Follow-up study. EXAM: MRI ABDOMEN AND PELVIS WITHOUT AND WITH CONTRAST TECHNIQUE: Multiplanar multisequence MR imaging of the abdomen and pelvis was  performed both before and after the administration of intravenous contrast. CONTRAST:  77mL GADAVIST GADOBUTROL 1 MMOL/ML IV SOLN COMPARISON:  None Available. FINDINGS: COMBINED FINDINGS FOR BOTH MR ABDOMEN AND PELVIS Lower chest: Unremarkable. Hepatobiliary: No suspicious cystic or solid hepatic lesions. No intra or extrahepatic biliary ductal dilatation. Status post cholecystectomy. Pancreas: No pancreatic mass. No pancreatic ductal dilatation. No pancreatic or peripancreatic fluid collections or inflammatory changes. Spleen:  Unremarkable. Adrenals/Urinary Tract: Staghorn calculus in the right renal collecting system noted on prior CT examination is not readily apparent on today's magnetic resonance examination. No suspicious renal lesions. Bilateral adrenal glands are normal in appearance. No hydroureteronephrosis. Urinary bladder is unremarkable in appearance. Stomach/Bowel: There are again subtle inflammatory changes in the proximal  transverse colon, poorly evaluated on today's magnetic resonance examination, but compatible with residual diverticulitis (better appreciated on prior CT examination 01/21/2023). Visualized portions of the bowel are otherwise unremarkable (poorly evaluated by magnetic resonance). Vascular/Lymphatic: No aneurysm identified in the abdominal aorta or pelvic vasculature. No definite lymphadenopathy noted in the abdomen or pelvis. Reproductive: Status post hysterectomy. Left ovary is either surgically absent or atrophic. Solid heterogeneously enhancing right ovarian lesion (axial image 44 of series 67 and sagittal image 24 of series 68) measuring 3.0 x 1.9 x 2.3 cm. Other:  No significant volume of ascites. Musculoskeletal: No aggressive appearing osseous lesions are noted in the visualized portions of the skeleton. IMPRESSION: 1. Solid heterogeneously enhancing right ovarian neoplasm measuring 3.0 x 1.9 x 2.3 cm, new compared to prior CT examination 08/13/2019, highly concerning for  malignancy in this postmenopausal female. 2. No definite lymphadenopathy or other signs of metastatic disease noted in the abdomen or pelvis. 3. Persistent inflammation adjacent to the proximal transverse colon, indicative of residual diverticulitis. 4. Additional incidental findings, as above. Electronically Signed: By: Vinnie Langton M.D. On: 01/31/2023 07:37   MR PELVIS W WO CONTRAST  Addendum Date: 02/09/2023   ADDENDUM REPORT: 02/09/2023 11:03 ADDENDUM: I reviewed this patient's MRI examination, prior CT examination from 01/21/2023 and a remote CT scan from 08/13/2019 with Dr. Mellody Drown from [facility]. The solid 3 x 2 cm right lower pelvic lesion appears to be closely associated with the right vaginal fornix, best seen on the coronal MRI and CT images (CT series number 5, image numbers 51 through 55). Also, this lesion was present on the prior CT scan from 2020 (series 4, image 93 and series 2, image 73). At that time it measured 1.6 x 1.2 cm. Although it has enlarged its presence since 2020 would suggest a benign lesion. It may be some type of chronic inflammatory process in or adjacent to the vaginal fornix. I do not see any definite communication with the colon or small bowel loops and the appendix is identified and is separate from this lesion. Further evaluation considerations would include a follow-up MRI examination in 4-6 months, transvaginal ultrasound examination and potential transvaginal biopsy, if indicated. PET-CT would be a consideration also. Electronically Signed   By: Marijo Sanes M.D.   On: 02/09/2023 11:03   Result Date: 02/09/2023 CLINICAL DATA:  72 year old female with history of right adnexal mass noted on prior CT examination. Follow-up study. EXAM: MRI ABDOMEN AND PELVIS WITHOUT AND WITH CONTRAST TECHNIQUE: Multiplanar multisequence MR imaging of the abdomen and pelvis was performed both before and after the administration of intravenous contrast. CONTRAST:  65mL  GADAVIST GADOBUTROL 1 MMOL/ML IV SOLN COMPARISON:  None Available. FINDINGS: COMBINED FINDINGS FOR BOTH MR ABDOMEN AND PELVIS Lower chest: Unremarkable. Hepatobiliary: No suspicious cystic or solid hepatic lesions. No intra or extrahepatic biliary ductal dilatation. Status post cholecystectomy. Pancreas: No pancreatic mass. No pancreatic ductal dilatation. No pancreatic or peripancreatic fluid collections or inflammatory changes. Spleen:  Unremarkable. Adrenals/Urinary Tract: Staghorn calculus in the right renal collecting system noted on prior CT examination is not readily apparent on today's magnetic resonance examination. No suspicious renal lesions. Bilateral adrenal glands are normal in appearance. No hydroureteronephrosis. Urinary bladder is unremarkable in appearance. Stomach/Bowel: There are again subtle inflammatory changes in the proximal transverse colon, poorly evaluated on today's magnetic resonance examination, but compatible with residual diverticulitis (better appreciated on prior CT examination 01/21/2023). Visualized portions of the bowel are otherwise unremarkable (poorly evaluated by magnetic resonance).  Vascular/Lymphatic: No aneurysm identified in the abdominal aorta or pelvic vasculature. No definite lymphadenopathy noted in the abdomen or pelvis. Reproductive: Status post hysterectomy. Left ovary is either surgically absent or atrophic. Solid heterogeneously enhancing right ovarian lesion (axial image 44 of series 67 and sagittal image 24 of series 68) measuring 3.0 x 1.9 x 2.3 cm. Other:  No significant volume of ascites. Musculoskeletal: No aggressive appearing osseous lesions are noted in the visualized portions of the skeleton. IMPRESSION: 1. Solid heterogeneously enhancing right ovarian neoplasm measuring 3.0 x 1.9 x 2.3 cm, new compared to prior CT examination 08/13/2019, highly concerning for malignancy in this postmenopausal female. 2. No definite lymphadenopathy or other signs of  metastatic disease noted in the abdomen or pelvis. 3. Persistent inflammation adjacent to the proximal transverse colon, indicative of residual diverticulitis. 4. Additional incidental findings, as above. Electronically Signed: By: Vinnie Langton M.D. On: 01/31/2023 07:37    Labs:  CBC: Recent Labs    01/21/23 0956  WBC 13.4*  HGB 12.6  HCT 39.7  PLT 432*    COAGS: No results for input(s): "INR", "APTT" in the last 8760 hours.  BMP: Recent Labs    01/21/23 0956  NA 137  K 4.2  CL 105  CO2 24  GLUCOSE 115*  BUN 15  CALCIUM 8.9  CREATININE 0.90  GFRNONAA >60    LIVER FUNCTION TESTS: Recent Labs    01/21/23 0956  BILITOT 0.8  AST 30  ALT 31  ALKPHOS 103  PROT 7.5  ALBUMIN 3.7    TUMOR MARKERS: No results for input(s): "AFPTM", "CEA", "CA199", "CHROMGRNA" in the last 8760 hours.  Assessment and Plan:  72 year old female with PMHx significant for neoplasm of bladder, endometrial adenocarcinoma, GERD, MDD, HLD and UTI.  She presents to IR for pelvic mass biopsy.  Risks and benefits of transgluteal approach pelvic mass biopsy moderate sedation was discussed with the patient and/or patient's family including, but not limited to bleeding, infection, damage to adjacent structures or low yield requiring additional tests.  All of the questions were answered and there is agreement to proceed.  Consent signed and in chart.  Thank you for this interesting consult.  I greatly enjoyed meeting Morgan Dalton and look forward to participating in their care.  A copy of this report was sent to the requesting provider on this date.  Electronically Signed: Tyson Alias, NP 02/26/2023, 1:07 PM   I spent a total of {New [id number] {New Out-Pt:[id number]}  {Established Out-Pt:[id number]} in face to face in clinical consultation, greater than 50% of which was counseling/coordinating care for pelvic mass.

## 2023-03-01 ENCOUNTER — Ambulatory Visit
Admission: RE | Admit: 2023-03-01 | Discharge: 2023-03-01 | Disposition: A | Discharge status: Home / Self Care | Payer: Medicare Other | Source: Ambulatory Visit | Attending: Nurse Practitioner | Admitting: Nurse Practitioner

## 2023-03-01 NOTE — Progress Notes (Signed)
Morgan Edouard, MD sent to [facility] [place] for CT guided biopsy of right pelvic mass from transgluteal approach. This IS NOT an adnexal mass, as she has had both ovaries and tubes removed so is carcinoma recurrence, if malignant.  GY       Previous Messages    ----- Message ----- From: [facility] Sent: 02/16/2023   3:05 PM EST To: Morgan Edouard, MD Subject: ATTN: Dr Pilar Jarvis   CT Biopsy                IR Approval Request:   Patient Name:   Morgan Dalton  Patient DOB:     12-07-1951  Procedure:        CT guided Adnexal Mass Biopsy  Reason:            RT adnexal mass / hx of endometrial cancer   Ordering Physician:   Beckey Rutter, NP  [place]

## 2023-03-02 ENCOUNTER — Ambulatory Visit: Payer: Medicare Other

## 2023-03-03 ENCOUNTER — Other Ambulatory Visit: Payer: Self-pay | Admitting: Radiology

## 2023-03-03 DIAGNOSIS — Z01812 Encounter for preprocedural laboratory examination: Secondary | ICD-10-CM

## 2023-03-03 NOTE — Progress Notes (Signed)
Patient for CT guided Pelvic Mass Biopsy on Fri 03/04/2023, I called and spoke with the patient on the phone and gave pre-procedure instructions. Pt was made aware to be here at [place], last dose of ASA 81mg  was on Mon 02/28/23, NPO after MN prior to procedure as well as driver post procedure/recovery/discharge. Pt stated understanding.  Called 03/03/2023

## 2023-03-04 ENCOUNTER — Other Ambulatory Visit: Payer: Self-pay

## 2023-03-04 ENCOUNTER — Ambulatory Visit
Admission: RE | Admit: 2023-03-04 | Discharge: 2023-03-04 | Disposition: A | Discharge status: Home / Self Care | Payer: Medicare Other | Source: Ambulatory Visit | Attending: Nurse Practitioner | Admitting: Nurse Practitioner

## 2023-03-04 DIAGNOSIS — Z01812 Encounter for preprocedural laboratory examination: Secondary | ICD-10-CM | POA: Insufficient documentation

## 2023-03-04 DIAGNOSIS — I10 Essential (primary) hypertension: Secondary | ICD-10-CM | POA: Diagnosis not present

## 2023-03-04 DIAGNOSIS — Z8542 Personal history of malignant neoplasm of other parts of uterus: Secondary | ICD-10-CM | POA: Insufficient documentation

## 2023-03-04 DIAGNOSIS — F419 Anxiety disorder, unspecified: Secondary | ICD-10-CM | POA: Diagnosis not present

## 2023-03-04 DIAGNOSIS — N9489 Other specified conditions associated with female genital organs and menstrual cycle: Secondary | ICD-10-CM | POA: Diagnosis not present

## 2023-03-04 LAB — CBC
HCT: 40.4 % (ref 36.0–46.0)
Hemoglobin: 13.3 g/dL (ref 12.0–15.0)
MCH: 28.9 pg (ref 26.0–34.0)
MCHC: 32.9 g/dL (ref 30.0–36.0)
MCV: 87.8 fL (ref 80.0–100.0)
Platelets: 482 10*3/uL — ABNORMAL HIGH (ref 150–400)
RBC: 4.6 MIL/uL (ref 3.87–5.11)
RDW: 14.6 % (ref 11.5–15.5)
WBC: 6 10*3/uL (ref 4.0–10.5)
nRBC: 0 % (ref 0.0–0.2)

## 2023-03-04 LAB — PROTIME-INR
INR: 1 (ref 0.8–1.2)
Prothrombin Time: 13 seconds (ref 11.4–15.2)

## 2023-03-04 MED ORDER — SODIUM CHLORIDE 0.9 % IV SOLN: INTRAVENOUS | Status: DC

## 2023-03-04 MED ORDER — MIDAZOLAM HCL 2 MG/2ML IJ SOLN
INTRAMUSCULAR | Status: AC
  Filled 2023-03-04: qty 2

## 2023-03-04 MED ORDER — FENTANYL CITRATE (PF) 100 MCG/2ML IJ SOLN
INTRAMUSCULAR | Status: AC
  Filled 2023-03-04: qty 2

## 2023-03-04 MED ORDER — MIDAZOLAM HCL 2 MG/2ML IJ SOLN
INTRAMUSCULAR | Status: AC | PRN
  Administered 2023-03-04: 1 mg via INTRAVENOUS

## 2023-03-04 MED ORDER — FENTANYL CITRATE (PF) 100 MCG/2ML IJ SOLN
INTRAMUSCULAR | Status: AC | PRN
  Administered 2023-03-04 (×2): 50 ug via INTRAVENOUS

## 2023-03-04 NOTE — Progress Notes (Signed)
Patient clinically stable post CT Abdominal mass biopsy per DR Annamaria Boots, tolerated well. Vitals stable pre and post procedure. Received Versed 1 mg along with Fentanyl 100 mcg IV for procedure. Denies complaints of pain post procedure. Report given to Jan RN post procedure/5.

## 2023-03-04 NOTE — H&P (Signed)
Chief Complaint: Patient was seen in consultation today for right adnexal mass  Referring Physician(s): Allen,Lauren G  Supervising Physician: Daryll Brod  Patient Status: [facility] - Out-pt  History of Present Illness: Morgan Dalton is a 72 y.o. female with PMH significant for anxiety, history of endometrial cancer in 2009, hypertension, and past surgical history of hysterectomy with oophorectomy being seen today in relation to a right adnexal mass. Patient underwent CT Abdomen/Pelvis on 01/21/23 which showed a new 3.2 cm solid mass in right posterior adnexa. Follow-up PET scan on 02/10/23 showed an intensely hypermetabolic solid mass favored to be a neoplastic deposit. Patient is being followed by Dr Fransisca Connors from OB-GYN who has referred patient to IR for image-guided biopsy of adnexal mass.  Past Medical History:  Diagnosis Date   Ankle fracture, left    Anxiety    Cancer (HCC)    Uterine Cancer   Dyspepsia    Endometrial cancer (HCC)    GERD (gastroesophageal reflux disease)    History of kidney stones    Hx of dysplastic nevus 10/16/2010   Right lower leg. Severe atypia. Excised: 11/25/2010. 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 Well differentiated. Tx: [facility]    Past Surgical History:  Procedure Laterality Date   CATARACT EXTRACTION W/PHACO Right 12/07/2016   Procedure: CATARACT EXTRACTION PHACO AND INTRAOCULAR LENS PLACEMENT (IOC);  Surgeon: Birder Robson, MD;  Location: [facility] ORS;  Service: Ophthalmology;  Laterality: Right;  [place] 42.5 AP% 23.2 CDE 9.87 Fluid pack lot # [id number]   CATARACT EXTRACTION W/PHACO Left 01/04/2017   Procedure: CATARACT EXTRACTION PHACO AND INTRAOCULAR LENS PLACEMENT (IOC);  Surgeon: Birder Robson, MD;  Location: [facility] ORS;  Service: Ophthalmology;  Laterality: Left;  [place] 39.2 AP% 23.9 CDE  9.38 Fluid Pack Lot # [id number]   CHOLECYSTECTOMY  2014   COLONOSCOPY  2014   COLONOSCOPY WITH ESOPHAGOGASTRODUODENOSCOPY (EGD)  2004   COLONOSCOPY WITH PROPOFOL N/A 11/07/2018   Procedure: COLONOSCOPY WITH PROPOFOL;  Surgeon: Lollie Sails, MD;  Location: [facility] ENDOSCOPY;  Service: Endoscopy;  Laterality: N/A;   DILATION AND CURETTAGE OF UTERUS     EXTRACORPOREAL SHOCK WAVE LITHOTRIPSY     EYE SURGERY     GANGLION CYST EXCISION Right    wrist   HALLUX VALGUS LAPIDUS Left 08/11/2022   Procedure: HALLUX VALGUS LAPIDUS;  Surgeon: [name], DPM;  Location: [facility] ORS;  Service: Podiatry;  Laterality: Left;   KIDNEY STONE SURGERY     several   KNEE ARTHROSCOPY Left 2014   partial medial meniscectomy, medial chondroplasty   TOTAL ABDOMINAL HYSTERECTOMY W/ BILATERAL SALPINGOOPHORECTOMY  2009    Allergies: Statins  Medications: Prior to Admission medications   Medication Sig Start Date End Date Taking? Authorizing Provider  aspirin EC 81 MG tablet Take 81 mg by mouth daily.  08/07/12   [provider]  cholecalciferol (VITAMIN D3) 10 MCG (400 UNIT) TABS tablet Take 1,000 Units by mouth daily.    [provider]  Coenzyme Q10 (CO Q-10) 100 MG CAPS Take 100 mg by mouth daily.    [provider]  Cranberry 400 MG CAPS Take 1 capsule by mouth daily.    [provider]  ezetimibe (ZETIA) 10 MG tablet Take 10 mg by mouth daily.    [provider]  Glucosamine-Chondroitin [phone]  MG TABS Take 1 tablet by mouth daily.    [provider]  hydrochlorothiazide (HYDRODIURIL) 25 MG tablet Take 12.5 mg by mouth daily.    [provider]  Javier Docker Oil 500 MG CAPS Take 500 mg by mouth daily.    [provider]  levothyroxine (SYNTHROID, LEVOTHROID) 25 MCG tablet Take 25 mcg by mouth daily before breakfast.    [provider]  losartan (COZAAR) 50 MG tablet Take 1 tablet by mouth daily. 12/31/22 12/31/23  [provider]  Multiple Vitamins tablet Take 1 tablet by mouth daily.    [provider]  ondansetron (ZOFRAN-ODT) 4 MG disintegrating tablet Take 1 tablet (4 mg total) by mouth every 8 (eight) hours as needed for nausea or vomiting. Patient not taking: Reported on 02/09/2023 01/21/23   Carrie Mew, MD  oxyCODONE-acetaminophen (PERCOCET) 5-325 MG tablet Take 1-2 tablets by mouth every 6 (six) hours as needed for severe pain. Max 6 tabs per day Patient not taking: Reported on 02/09/2023 08/11/22   [name], DPM  pantoprazole (PROTONIX) 40 MG tablet Take 40 mg by mouth every other day.    [provider]  rosuvastatin (CRESTOR) 5 MG tablet Take 5 mg by mouth once a week. Thursday night    [provider]  sertraline (ZOLOFT) 50 MG tablet Take 50 mg by mouth daily.    [provider]     Family History  Problem Relation Age of Onset   Breast cancer Paternal Aunt     Social History   Socioeconomic History   Marital status: Married    Spouse name: Herbie Baltimore   Number of children: 2   Years of education: Not on file   Highest education level: Not on file  Occupational History   Not on file  Tobacco Use   Smoking status: Never   Smokeless tobacco: Never  Vaping Use   Vaping Use: Never used  Substance and Sexual Activity   Alcohol use: No   Drug use: No   Sexual activity: Not on file  Other Topics Concern   Not on file  Social History Narrative   Not on file   Social Determinants of Health   Financial Resource Strain: Not on file  Food Insecurity: Not on file  Transportation Needs: Not on file  Physical Activity: Not on file  Stress: Not on file  Social Connections: Not on file    Code Status: Full code  Review of Systems: A 12 point ROS discussed and pertinent positives are indicated in the HPI above.  All other systems are negative.  Review of Systems  Constitutional:  Negative for chills and fever.  Respiratory:  Negative for  chest tightness and shortness of breath.   Cardiovascular:  Positive for leg swelling. Negative for chest pain.  Gastrointestinal:  Negative for abdominal pain, diarrhea, nausea and vomiting.  Neurological:  Negative for dizziness and headaches.  Psychiatric/Behavioral:  Negative for confusion.     Vital Signs: BP (!) 153/67   Pulse 75   Temp 97.9 F (36.6 C) (Oral)   Resp 14   Ht 5\' 4"  (1.626 m)   Wt 191 lb (86.6 kg)   SpO2 97%   BMI 32.79 kg/m    Physical Exam Vitals reviewed.  HENT:     Mouth/Throat:     Mouth: Mucous membranes are moist.  Eyes:     Pupils: Pupils are equal, round, and reactive to light.  Cardiovascular:     Rate and  Rhythm: Normal rate and regular rhythm.     Pulses: Normal pulses.     Heart sounds: Normal heart sounds.  Pulmonary:     Effort: Pulmonary effort is normal.     Breath sounds: Normal breath sounds.  Abdominal:     General: Bowel sounds are normal.     Palpations: Abdomen is soft.     Tenderness: There is no abdominal tenderness.  Musculoskeletal:     Right lower leg: Edema present.     Left lower leg: Edema present.  Skin:    General: Skin is warm and dry.  Neurological:     Mental Status: She is alert and oriented to person, place, and time.  Psychiatric:        Mood and Affect: Mood normal.        Behavior: Behavior normal.        Thought Content: Thought content normal.        Judgment: Judgment normal.     Imaging: NM PET Image Initial (PI) Skull Base To Thigh  Addendum Date: 02/10/2023   ADDENDUM REPORT: 02/10/2023 15:22 ADDENDUM: Upon further review and conferring with nurse practitioner Antony Haste, history was provided of prior surgical hysterectomy oophorectomy. Therefore intensely hypermetabolic solid mass in the deep RIGHT pelvis is favored a neoplastic deposit potentially from remote endometrial carcinoma. Findings conveyed toLAUREN ALLEN on 02/10/2023  at15:21. Electronically Signed   By: Suzy Bouchard M.D.   On:  02/10/2023 15:22   Result Date: 02/10/2023 CLINICAL DATA:  Initial treatment strategy for cervical carcinoma. Adnexal mass. EXAM: NUCLEAR MEDICINE PET SKULL BASE TO THIGH TECHNIQUE: 10.6 mCi F-18 FDG was injected intravenously. Full-ring PET imaging was performed from the skull base to thigh after the radiotracer. CT data was obtained and used for attenuation correction and anatomic localization. Fasting blood glucose: 106 mg/dl COMPARISON:  CT 01/21/2023 FINDINGS: Mediastinal blood pool activity: SUV max 2.5 Liver activity: SUV max NA NECK: No hypermetabolic lymph nodes in the neck. Incidental CT findings: None. CHEST: No hypermetabolic mediastinal or hilar nodes. No suspicious pulmonary nodules on the CT scan. Incidental CT findings: None. ABDOMEN/PELVIS: Within the deep RIGHT pelvis, oblong solid mass measuring 3.1 x 2.4 cm has intense metabolic activity SUV max equal 16.1. This is described as ovarian neoplasm on comparison CT and MRI. Post hysterectomy anatomy. No abnormal metabolic activity associated with the vagina. No hypermetabolic lymph nodes in the pelvis. No periaortic retroperitoneal nodal metastasis No liver metastasis No peritoneal disease Incidental CT findings: Bulky nonobstructing branching calculus in the RIGHT kidney. SKELETON: No focal hypermetabolic activity to suggest skeletal metastasis. Incidental CT findings: None. IMPRESSION: 1. Hypermetabolic solid mass in the RIGHT pelvis consistent with ovarian neoplasm. 2. No evidence of metastatic adenopathy in the pelvis or periaortic retroperitoneum. 3. No evidence of metastatic disease outside the pelvis. 4. Post hysterectomy anatomy. No abnormal metabolic activity associated with the vagina. 5. RIGHT renal staghorn calculus. Electronically Signed: By: Suzy Bouchard M.D. On: 02/10/2023 12:14    Labs:  CBC: Recent Labs    01/21/23 0956  WBC 13.4*  HGB 12.6  HCT 39.7  PLT 432*    COAGS: No results for input(s): "INR", "APTT" in  the last 8760 hours.  BMP: Recent Labs    01/21/23 0956  NA 137  K 4.2  CL 105  CO2 24  GLUCOSE 115*  BUN 15  CALCIUM 8.9  CREATININE 0.90  GFRNONAA >60    LIVER FUNCTION TESTS: Recent Labs    01/21/23 0956  BILITOT 0.8  AST 30  ALT 31  ALKPHOS 103  PROT 7.5  ALBUMIN 3.7    TUMOR MARKERS: No results for input(s): "AFPTM", "CEA", "CA199", "CHROMGRNA" in the last 8760 hours.  Assessment and Plan:  Morgan Dalton is a 72 yo female being seen today in relation to a right adnexal mass concerning for malignancy. The patient presents today in her usual state of health and reports that she has been NPO since midnight. Case has been reviewed and approved by Dr Kathlene Cote for image-guided right adnexal mass biopsy and is scheduled to proceed with Dr Annamaria Boots on 03/04/23.  Risks and benefits of image-guided right adnexal mass biopsy was discussed with the patient and/or patient's family including, but not limited to bleeding, infection, damage to adjacent structures or low yield requiring additional tests.  All of the questions were answered and there is agreement to proceed.  Consent signed and in chart.   Thank you for this interesting consult.  I greatly enjoyed meeting AMAIRAH KOELLNER and look forward to participating in their care.  A copy of this report was sent to the requesting provider on this date.  Electronically Signed: Lura Em, PA-C 03/04/2023, 8:39 AM   I spent a total of  30 Minutes   in face to face in clinical consultation, greater than 50% of which was counseling/coordinating care for right adnexal mass.

## 2023-03-04 NOTE — Procedures (Signed)
Interventional Radiology Procedure Note  Procedure: CT BX RT ADNEXAL MASS    Complications: None  Estimated Blood Loss:  MIN  Findings: 85 G CORES    Tamera Punt, MD

## 2023-03-11 ENCOUNTER — Ambulatory Visit
Admission: RE | Admit: 2023-03-11 | Discharge: 2023-03-11 | Disposition: A | Discharge status: Home / Self Care | Payer: Medicare Other | Source: Ambulatory Visit | Attending: Internal Medicine | Admitting: Internal Medicine

## 2023-03-11 ENCOUNTER — Telehealth: Payer: Self-pay

## 2023-03-11 DIAGNOSIS — Z1231 Encounter for screening mammogram for malignant neoplasm of breast: Secondary | ICD-10-CM | POA: Diagnosis present

## 2023-03-11 NOTE — Telephone Encounter (Signed)
Call placed to Morgan Dalton. Provided results of pathology from biopsy. Appointment arranged for her to follow up with gyn oncology for treatment plan.  DIAGNOSIS:  A. SOFT TISSUE, RIGHT ADNEXAL; CT-GUIDED CORE NEEDLE BIOPSY:  - POSITIVE FOR MALIGNANCY.  - METASTATIC ADENOCARCINOMA, COMPATIBLE WITH ENDOMETRIOID  ADENOCARCINOMA

## 2023-03-16 ENCOUNTER — Inpatient Hospital Stay: Payer: Medicare Other | Attending: Obstetrics and Gynecology | Admitting: Obstetrics and Gynecology

## 2023-03-16 VITALS — BP 147/80 | HR 65 | Temp 96.8°F | Ht 64.0 in | Wt 192.2 lb

## 2023-03-16 DIAGNOSIS — Z79899 Other long term (current) drug therapy: Secondary | ICD-10-CM | POA: Diagnosis not present

## 2023-03-16 DIAGNOSIS — I10 Essential (primary) hypertension: Secondary | ICD-10-CM | POA: Insufficient documentation

## 2023-03-16 DIAGNOSIS — Z8542 Personal history of malignant neoplasm of other parts of uterus: Secondary | ICD-10-CM | POA: Insufficient documentation

## 2023-03-16 DIAGNOSIS — Z923 Personal history of irradiation: Secondary | ICD-10-CM | POA: Insufficient documentation

## 2023-03-16 DIAGNOSIS — Z7982 Long term (current) use of aspirin: Secondary | ICD-10-CM | POA: Insufficient documentation

## 2023-03-16 DIAGNOSIS — Z7189 Other specified counseling: Secondary | ICD-10-CM

## 2023-03-16 DIAGNOSIS — Z90710 Acquired absence of both cervix and uterus: Secondary | ICD-10-CM | POA: Diagnosis not present

## 2023-03-16 DIAGNOSIS — C7989 Secondary malignant neoplasm of other specified sites: Secondary | ICD-10-CM | POA: Insufficient documentation

## 2023-03-16 DIAGNOSIS — Z9079 Acquired absence of other genital organ(s): Secondary | ICD-10-CM | POA: Insufficient documentation

## 2023-03-16 DIAGNOSIS — N200 Calculus of kidney: Secondary | ICD-10-CM | POA: Insufficient documentation

## 2023-03-16 DIAGNOSIS — Z90722 Acquired absence of ovaries, bilateral: Secondary | ICD-10-CM | POA: Diagnosis not present

## 2023-03-16 DIAGNOSIS — C541 Malignant neoplasm of endometrium: Secondary | ICD-10-CM

## 2023-03-16 DIAGNOSIS — E039 Hypothyroidism, unspecified: Secondary | ICD-10-CM | POA: Insufficient documentation

## 2023-03-16 NOTE — Progress Notes (Signed)
Gynecologic Oncology Consult Visit   Referring Provider: Dr. Ouida Sills  Chief Complaint: Right pelvic mass; hx of endometrial cancer in 2009  Subjective:  Morgan Dalton is a 72 y.o. female who is seen in consultation from Dr. Caryl Comes with history of endometrial cancer in 2009 who presents for discussion of imaging for incidental right pelvic mass.   She presents today for discussion of management options.   DIAGNOSIS:  A. SOFT TISSUE, RIGHT ADNEXAL; CT-GUIDED CORE NEEDLE BIOPSY:  - POSITIVE FOR MALIGNANCY.  - METASTATIC ADENOCARCINOMA, COMPATIBLE WITH ENDOMETRIOID  ADENOCARCINOMA.   The patient's prior history of endometrioid adenocarcinoma in 2009 is noted.  Biopsy sections display an abnormal cribriform and somewhat papillary glandular proliferation, histologically compatible with the patient's previously diagnosed endometrioid adenocarcinoma. Immunohistochemical studies show tumor cells to be positive for pancytokeratin, PAX8, estrogen receptor (greater than 90%, strong), and progesterone receptor (greater than 90%, strong).  Tumor cells are negative for CK7, WT1, CDX2, and Napsin A.  P53 demonstrates a wild-type (non-mutated) pattern of expression. MMR pending.   She is doing well after her biopsy.  She does have some crampy abdominal pain.  She has no bowel or bladder issues  Gynecologic History:  Patient went to ER for RUQ abdominal pain. CT 01/21/23 showed diverticulitis at hepatic flexure with some free air. Treated with antibiotics for diverticulitis.  Incidental finding of solid mass of right posterior adnexa measuring 3.2 x 2.2 cm, new since previous CT scan in 2020. She also has a large staghorn calculus in the right kidney.  She was seen by Dr. Ouida Sills in [place]. Asymptomatic now.  No vaginal discharge.  01/30/23- MRI Abdomen and Pelvis w wo contrast S/p hysterectomy. Left ovary not visualized. Solid heterogeneously enhancing right ovarian lesion measuring 3.0 x 1.9 x 2.3 cm. No  ascites. No lymphadenopathy. Inflammation of proximal transverse colon consistent with residual diverticulitis.   HE4- 142 (elevated) CA 125 - 18 (normal) ROMA- 30.57% (high risk)  01/25/23- Pap- NILM  Last colonoscopy 11/07/2018- report reviewed and showed tortuous colon, diverticulosis, recommendation to repeat in 5 years (2024)  Patient provided [place] with her records from her endometrial cancer which were reviewed in detail, copied, and scanned into her chart.   She underwent vaginal hysterectomy with Dr Ouida Sills at [facility] in 2009. 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 She was referr to Dr. Sabra Heck at [facility] and underwent robotic assisted bilateral salpino-oophorectomy, bilateral pelvic lymph node dissection, supraumbilical laparotomy and periaortic lymph node dissection. Surgery complicated by adhesions. Final pathology revealed a deeply invasive (two thirds), well differentiated endometrioid adenocarcinoma, stage Ic. Washings cervix and adnexa were negative. She elected for adjuvant vaginal brachytherapy at [facility] and completed treatment 02/02/2008.   01/21/23 CT Abdomen/Pelvis IMPRESSION: Moderate acute diverticulitis involving the hepatic flexure of the colon, with microperforation. No evidence of abscess or bowel obstruction. Stable partial staghorn calculus in lower pole collecting system of right kidney. No evidence of ureteral calculi or hydronephrosis.  New 3.2 cm solid mass in right posterior adnexa, suspicious for ovarian neoplasm. Recommend further evaluation with pelvic MRI without and with contrast.  01/30/23 MRI was reviewed with radiology. MRI was addended to reflect that mass was present on CT from 2020 and measured 1.6 x 1.2 cm at that time. No definite communication with the colon or small bowel loops and the appendix is identified as separate from this lesion.   02/10/23- PET IMPRESSION: 1. Hypermetabolic solid mass in the RIGHT pelvis  consistent with ovarian neoplasm. 2. No evidence of  metastatic adenopathy in the pelvis or periaortic retroperitoneum. 3. No evidence of metastatic disease outside the pelvis. 4. Post hysterectomy anatomy. No abnormal metabolic activity associated with the vagina. 5. RIGHT renal staghorn calculus.  Imaging was reviewed by Ander Purpura, NP and Dr. Leonia Reeves. Report was addended to reflect that based on her prior surgical hysterectomy and oophorectomy, intensely hypermetabolic solid mass is favored to be a neoplastic deposit, potentially from remote endometrial carcinoma.   Recommendations for imaging guided biopsy  Problem List: Patient Active Problem List   Diagnosis Date Noted   GERD (gastroesophageal reflux disease) 12/31/2016   History of nephrolithiasis 12/31/2016   Hyperlipidemia, unspecified 12/31/2016   Insomnia 12/31/2016   Osteopenia 12/31/2016   Acquired hypothyroidism 10/26/2016   Bladder neoplasm of uncertain malignant potential 09/09/2016   Bladder pain 08/11/2016   Chronic cystitis 08/11/2016   Mild episode of recurrent major depressive disorder 07/23/2016   History of endometrial cancer [id number]   Renal colic [id number]   Right upper quadrant pain 08/07/2012   Kidney stone 08/07/2012   Gross hematuria 08/07/2012   Malignant neoplasm of corpus uteri 08/07/2012   Microscopic hematuria 08/07/2012   Urinary tract infection 08/07/2012    Past Medical History: Past Medical History:  Diagnosis Date   Ankle fracture, left    Anxiety    Cancer    Uterine Cancer   Dyspepsia    Endometrial cancer    GERD (gastroesophageal reflux disease)    History of kidney stones    Hx of dysplastic nevus 10/16/2010   Right lower leg. Severe atypia. Excised: 11/25/2010. 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 Well differentiated. Tx: [facility]     Past Surgical History: Past Surgical History:  Procedure Laterality Date   CATARACT EXTRACTION W/PHACO Right 12/07/2016   Procedure: CATARACT EXTRACTION PHACO AND INTRAOCULAR LENS PLACEMENT (IOC);  Surgeon: Birder Robson, MD;  Location: [facility] ORS;  Service: Ophthalmology;  Laterality: Right;  [place] 42.5 AP% 23.2 CDE 9.87 Fluid pack lot # [id number]   CATARACT EXTRACTION W/PHACO Left 01/04/2017   Procedure: CATARACT EXTRACTION PHACO AND INTRAOCULAR LENS PLACEMENT (IOC);  Surgeon: Birder Robson, MD;  Location: [facility] ORS;  Service: Ophthalmology;  Laterality: Left;  [place] 39.2 AP% 23.9 CDE 9.38 Fluid Pack Lot # [id number]   CHOLECYSTECTOMY  2014   COLONOSCOPY  2014   COLONOSCOPY WITH ESOPHAGOGASTRODUODENOSCOPY (EGD)  2004   COLONOSCOPY WITH PROPOFOL N/A 11/07/2018   Procedure: COLONOSCOPY WITH PROPOFOL;  Surgeon: Lollie Sails, MD;  Location: [facility] ENDOSCOPY;  Service: Endoscopy;  Laterality: N/A;   DILATION AND CURETTAGE OF UTERUS     EXTRACORPOREAL SHOCK WAVE LITHOTRIPSY     EYE SURGERY     GANGLION CYST EXCISION Right    wrist   HALLUX VALGUS LAPIDUS Left 08/11/2022   Procedure: HALLUX VALGUS LAPIDUS;  Surgeon: [name], DPM;  Location: [facility] ORS;  Service: Podiatry;  Laterality: Left;   KIDNEY STONE SURGERY     several   KNEE ARTHROSCOPY Left 2014   partial medial meniscectomy, medial chondroplasty   NM PET TUMOR IMAGING  02/07/2023   TOTAL ABDOMINAL HYSTERECTOMY W/ BILATERAL SALPINGOOPHORECTOMY  2009    Past Gynecologic History:  G2P2 Post menopausal Menarche: age 72  OB History:  OB History  No obstetric history on file.    Family History: Family History  Problem  Relation Age of Onset   Breast cancer Paternal Aunt     Social History: Social History   Socioeconomic History   Marital status: Married    Spouse name: Herbie Baltimore   Number of children: 2   Years of education: Not on file   Highest education level: Not on file  Occupational History   Not on  file  Tobacco Use   Smoking status: Never   Smokeless tobacco: Never  Vaping Use   Vaping Use: Never used  Substance and Sexual Activity   Alcohol use: No   Drug use: No   Sexual activity: Not on file  Other Topics Concern   Not on file  Social History Narrative   Not on file   Social Determinants of Health   Financial Resource Strain: Not on file  Food Insecurity: Not on file  Transportation Needs: Not on file  Physical Activity: Not on file  Stress: Not on file  Social Connections: Not on file  Intimate Partner Violence: Not on file    Allergies: Allergies  Allergen Reactions   Statins Other (See Comments)    Myalgia     Current Medications: Current Outpatient Medications  Medication Sig Dispense Refill   aspirin EC 81 MG tablet Take 81 mg by mouth daily.      cholecalciferol (VITAMIN D3) 10 MCG (400 UNIT) TABS tablet Take 1,000 Units by mouth daily.     Coenzyme Q10 (CO Q-10) 100 MG CAPS Take 100 mg by mouth daily.     Cranberry 400 MG CAPS Take 1 capsule by mouth daily.     ezetimibe (ZETIA) 10 MG tablet Take 10 mg by mouth daily.     Glucosamine-Chondroitin 750-600 MG TABS Take 1 tablet by mouth daily.     hydrochlorothiazide (HYDRODIURIL) 25 MG tablet Take 12.5 mg by mouth daily.     Krill Oil 500 MG CAPS Take 500 mg by mouth daily.     levothyroxine (SYNTHROID, LEVOTHROID) 25 MCG tablet Take 25 mcg by mouth daily before breakfast.     losartan (COZAAR) 50 MG tablet Take 1 tablet by mouth daily.     Multiple Vitamins tablet Take 1 tablet by mouth daily.     pantoprazole (PROTONIX) 40 MG tablet Take 40 mg by mouth every other day.     rosuvastatin (CRESTOR) 5 MG tablet Take 5 mg by mouth once a week. Thursday night     sertraline (ZOLOFT) 50 MG tablet Take 50 mg by mouth daily.     ondansetron (ZOFRAN-ODT) 4 MG disintegrating tablet Take 1 tablet (4 mg total) by mouth every 8 (eight) hours as needed for nausea or vomiting. (Patient not taking: Reported on  02/09/2023) 20 tablet 0   oxyCODONE-acetaminophen (PERCOCET) 5-325 MG tablet Take 1-2 tablets by mouth every 6 (six) hours as needed for severe pain. Max 6 tabs per day (Patient not taking: Reported on 02/09/2023) 30 tablet 0   No current facility-administered medications for this visit.   Review of Systems General: no complaints  HEENT: no complaints  Lungs: no complaints  Cardiac: no complaints  GI: as per history  GU: no complaints  Musculoskeletal: no complaints  Extremities: no complaints  Skin: no complaints  Neuro: no complaints  Endocrine: no complaints  Psych: no complaints       Objective:  Physical Examination:  BP (!) 147/80 (BP Location: Left Arm, Patient Position: Sitting)   Pulse 65   Temp (!) 96.8 F (36 C) (Tympanic)   Ht  5\' 4"  (1.626 m)   Wt 192 lb 3.2 oz (87.2 kg)   SpO2 100%   BMI 32.99 kg/m     ECOG Performance Status: 0 - Asymptomatic  Exam deferred  Pelvic: deferred EGBUS: no lesions Cervix, uterus, ovaries: surgically absent Vagina: very atrophic with adhesions, some of which were broken down manually with a finger.  Adnexa: no palpable masses Rectovaginal:  small mass on the right and it is smooth and adjacent to the rectum.   Lab Review   Lab Results  Component Value Date   WBC 6.0 03/04/2023   HGB 13.3 03/04/2023   HCT 40.4 03/04/2023   MCV 87.8 03/04/2023   PLT 482 (H) 03/04/2023     Chemistry      Component Value Date/Time   NA 137 01/21/2023 0956   K 4.2 01/21/2023 0956   K 4.0 12/18/2012 0947   CL 105 01/21/2023 0956   CO2 24 01/21/2023 0956   BUN 15 01/21/2023 0956   CREATININE 0.90 01/21/2023 0956      Component Value Date/Time   CALCIUM 8.9 01/21/2023 0956   ALKPHOS 103 01/21/2023 0956   ALKPHOS 106 09/18/2013 0855   AST 30 01/21/2023 0956   AST 27 09/18/2013 0855   ALT 31 01/21/2023 0956   ALT 27 09/18/2013 0855   BILITOT 0.8 01/21/2023 0956   BILITOT 0.2 09/18/2013 0855       Radiologic Imaging: Per  hpi      Assessment:  Morgan Dalton is a 72 y.o. 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 Presented to the ED 01/21/23 with RUQ pain and CT scan showed diverticulitis in the hepatic flexure with some small amounts of free air and treated with antibiotics. CT scan showed 3 cm heterogeneous mass in right posterior pelvis (reported to be ovarian mass, but ovaries removed previously).  CA125 normal, HE4 elevated.  Biopsy confirmed recurrent localized endometrioid endometrial (MMR pending, wild-type p53 intact, ER/PR positive) cancer  Medical co-morbidities complicating care: diverticulitis, hypothyroid.  Plan:   Problem List Items Addressed This Visit   None Visit Diagnoses     Endometrial cancer    -  Primary   Relevant Orders   Ambulatory referral to Radiation Oncology   Ambulatory referral to Hematology / Oncology       We discussed her various treatment options.  Given localized disease the options include radiation, surgery, antineoplastic chemotherapy.  She is really interested in quality life.  She is a retired [employer] scientist.  We discussed briefly the risks of surgery, possible need for ostomy which may be amendable for subsequent takedown, and length of hospital stay.  We also reviewed that radiation can be delivered to the whole pelvis.  I reach out to Dr. Donella Stade to see if there be any concerns regarding radiation therapy given her history of diverticulitis and active infection recently.  If she opts for radiation we could follow that with maintenance hormonal therapy given the high estrogen and progesterone receptor status.  Alternatively systemic therapy with chemotherapy with or without immunotherapy may be an option.  Immunotherapy appears to benefit patient's with MSS disease if they have a p53 mutation.  Her MMR status is  still pending. If dMMR/MSI-H then chemotherapy with IO would be our recommendation. If pMMR, another alternative is targeting the wild-type p53 protein with selinexor and she may be a candidate for  the xport study after chemotherapy.  We reviewed that each of these approaches have very different benefits and risks.  The side effects of radiation, chemotherapy, IO and hormonal therapy vary.  We will arrange for medical oncology and radiation oncology consultations.  I gave her information regarding paclitaxel and carboplatinum.  We will try to discuss at tumor board at [place] this coming Monday if possible. Follow up MMR testing.  Plan for telemedicine visit in 2 weeks to follow-up on her treatment plan.  We may proceed with the planned sooner based on input from radiation oncology and medical oncology  The patient's diagnosis, an outline of the further diagnostic and laboratory studies which will be required, the recommendation for surgery, and alternatives were discussed with her and her accompanying family members.  All questions were answered to their satisfaction.    Genesys Coggeshall Gaetana Michaelis, MD

## 2023-03-18 ENCOUNTER — Inpatient Hospital Stay: Payer: Medicare Other

## 2023-03-18 ENCOUNTER — Encounter: Payer: Self-pay | Admitting: Oncology

## 2023-03-18 ENCOUNTER — Inpatient Hospital Stay (HOSPITAL_BASED_OUTPATIENT_CLINIC_OR_DEPARTMENT_OTHER): Payer: Medicare Other | Admitting: Oncology

## 2023-03-18 VITALS — BP 147/73 | HR 60 | Temp 96.7°F | Resp 16 | Ht 65.0 in | Wt 193.9 lb

## 2023-03-18 DIAGNOSIS — C7989 Secondary malignant neoplasm of other specified sites: Secondary | ICD-10-CM | POA: Diagnosis not present

## 2023-03-18 DIAGNOSIS — C541 Malignant neoplasm of endometrium: Secondary | ICD-10-CM

## 2023-03-18 DIAGNOSIS — Z7189 Other specified counseling: Secondary | ICD-10-CM | POA: Diagnosis not present

## 2023-03-20 DIAGNOSIS — C541 Malignant neoplasm of endometrium: Secondary | ICD-10-CM | POA: Insufficient documentation

## 2023-03-20 NOTE — Progress Notes (Signed)
Hematology/Oncology Consult note [facility] [phone] [phone]  Patient Care Team: Lynnea Ferrier, MD as PCP - General (Internal Medicine)   Name of the patient: Morgan Dalton  [id number]  [birth year]    Reason for referral-recurrent endometrial carcinoma   Referring physician-Dr. Sonia Side  Date of visit: 03/20/23   History of presenting illness-patient is a 72 year old female who was diagnosed with endometrioid endometrial carcinoma in 2009.  She underwent robotic assisted bilateral salpingo-oophorectomy and bilateral pelvic node dissection with supraumbilical laparotomy and periaortic lymph node dissection at [facility].  Final pathology showed deeply invasive well-differentiated endometrioid carcinoma stage Ic.  Washings from cervix and adnexa were negative.  She underwent adjuvant vaginal brachytherapy at [facility] which she completed in February 2009.  Patient presented with symptoms of abdominal pain in February 2024 and underwent CT abdomen and pelvis with contrast which showed acute diverticulitis involving the hepatic flexure of the colon with microperforation for which she received antibiotics.  Incidentally she was found to have a new 3.2 cm solid mass in the right posterior adnexa concerning for ovarian neoplasm.  She then had an MRI pelvis with and without contrast which showed similar findings.  PET scan from 02/10/2023 showed hypermetabolic solid mass in the right pelvis concerning for GYN neoplasm.  No evidence of metastatic adenopathy in the pelvis or periaortic retroperitoneum.  No evidence of metastatic disease outside the pelvis.  This was biopsied and was consistent with endometrioid endometrial carcinoma.Immunohistochemical studies show tumor cells to be positive for pancytokeratin, PAX8, estrogen receptor (greater than 90%, strong), and progesterone receptor (greater than 90%, strong).  Tumor cells are negative for CK7, WT1, CDX2,  and Napsin A.  P53 demonstrates a wild-type (non-mutated) pattern of expression. MMR and her 2 pending.   Currently patient is asymptomatic.  Denies any vaginal bleeding or abdominal pain.  She is a retired Engineer, civil (consulting).    ECOG PS- 0  Pain scale- 0   Review of systems- Review of Systems  Constitutional:  Negative for chills, fever, malaise/fatigue and weight loss.  HENT:  Negative for congestion, ear discharge and nosebleeds.   Eyes:  Negative for blurred vision.  Respiratory:  Negative for cough, hemoptysis, sputum production, shortness of breath and wheezing.   Cardiovascular:  Negative for chest pain, palpitations, orthopnea and claudication.  Gastrointestinal:  Negative for abdominal pain, blood in stool, constipation, diarrhea, heartburn, melena, nausea and vomiting.  Genitourinary:  Negative for dysuria, flank pain, frequency, hematuria and urgency.  Musculoskeletal:  Negative for back pain, joint pain and myalgias.  Skin:  Negative for rash.  Neurological:  Negative for dizziness, tingling, focal weakness, seizures, weakness and headaches.  Endo/Heme/Allergies:  Does not bruise/bleed easily.  Psychiatric/Behavioral:  Negative for depression and suicidal ideas. The patient does not have insomnia.     Allergies  Allergen Reactions   Statins Other (See Comments)    Myalgia     Patient Active Problem List   Diagnosis Date Noted   GERD (gastroesophageal reflux disease) 12/31/2016   History of nephrolithiasis 12/31/2016   Hyperlipidemia, unspecified 12/31/2016   Insomnia 12/31/2016   Osteopenia 12/31/2016   Acquired hypothyroidism 10/26/2016   Bladder neoplasm of uncertain malignant potential 09/09/2016   Bladder pain 08/11/2016   Chronic cystitis 08/11/2016   Mild episode of recurrent major depressive disorder 07/23/2016   History of endometrial cancer 01/23/2016   Renal colic 06/19/2014   Right upper quadrant pain 08/07/2012   Kidney stone 08/07/2012   Gross hematuria  08/07/2012  Malignant neoplasm of corpus uteri 08/07/2012   Microscopic hematuria 08/07/2012   Urinary tract infection 08/07/2012     Past Medical History:  Diagnosis Date   Ankle fracture, left    Anxiety    Cancer    Uterine Cancer   Dyspepsia    Endometrial cancer    GERD (gastroesophageal reflux disease)    History of kidney stones    Hx of dysplastic nevus 10/16/2010   Right lower leg. Severe atypia. Excised: 11/25/2010. 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 Well differentiated. [facility]     Past Surgical History:  Procedure Laterality Date   ABDOMINAL HYSTERECTOMY     CATARACT EXTRACTION W/PHACO Right 12/07/2016   Procedure: CATARACT EXTRACTION PHACO AND INTRAOCULAR LENS PLACEMENT (IOC);  Surgeon: Galen ManilaWilliam Porfilio, MD;  Location: [facility] ORS;  Service: Ophthalmology;  Laterality: Right;  US 42.5 AP% 23.2 CDE 9.87 Fluid pack lot # [id number]   CATARACT EXTRACTION W/PHACO Left 01/04/2017   Procedure: CATARACT EXTRACTION PHACO AND INTRAOCULAR LENS PLACEMENT (IOC);  Surgeon: Galen ManilaWilliam Porfilio, MD;  Location: [facility] ORS;  Service: Ophthalmology;  Laterality: Left;  US 39.2 AP% 23.9 CDE 9.38 Fluid Pack Lot # [id number]   CHOLECYSTECTOMY  2014   COLONOSCOPY  2014   COLONOSCOPY WITH ESOPHAGOGASTRODUODENOSCOPY (EGD)  2004   COLONOSCOPY WITH PROPOFOL N/A 11/07/2018   Procedure: COLONOSCOPY WITH PROPOFOL;  Surgeon: Christena DeemSkulskie, Martin U, MD;  [facility];  Service: Endoscopy;  Laterality: N/A;   DILATION AND CURETTAGE OF UTERUS     EXTRACORPOREAL SHOCK WAVE LITHOTRIPSY     EYE SURGERY     GANGLION CYST EXCISION Right    wrist   HALLUX VALGUS LAPIDUS Left 08/11/2022   Procedure: HALLUX VALGUS LAPIDUS;  Surgeon: Gwyneth RevelsFowler, Justin, DPM;  Location: [facility] ORS;  Service: Podiatry;  Laterality: Left;   KIDNEY STONE SURGERY     several    KNEE ARTHROSCOPY Left 2014   partial medial meniscectomy, medial chondroplasty   NM PET TUMOR IMAGING  02/07/2023   TOTAL ABDOMINAL HYSTERECTOMY W/ BILATERAL SALPINGOOPHORECTOMY  2009    Social History   Socioeconomic History   Marital status: Married    Spouse name: Molly MaduroRobert   Number of children: 2   Years of education: Not on file   Highest education level: Not on file  Occupational History   Not on file  Tobacco Use   Smoking status: Never   Smokeless tobacco: Never  Vaping Use   Vaping Use: Never used  Substance and Sexual Activity   Alcohol use: No   Drug use: No   Sexual activity: Not on file  Other Topics Concern   Not on file  Social History Narrative   Not on file   Social Determinants of Health   Financial Resource Strain: Not on file  Food Insecurity: No Food Insecurity (03/18/2023)   Hunger Vital Sign    Worried About Running Out of Food in the Last Year: Never true    Ran Out of Food in the Last Year: Never true  Transportation Needs: No Transportation Needs (03/18/2023)   PRAPARE - Administrator, Civil ServiceTransportation    Lack of Transportation (Medical): No    Lack of Transportation (Non-Medical): No  Physical Activity: Not on file  Stress: Not on file  Social Connections: Not on file  Intimate Partner Violence: Not  At Risk (03/18/2023)   Humiliation, Afraid, Rape, and Kick questionnaire    Fear of Current or Ex-Partner: No    Emotionally Abused: No    Physically Abused: No    Sexually Abused: No     Family History  Problem Relation Age of Onset   Breast cancer Paternal Aunt      Current Outpatient Medications:    aspirin EC 81 MG tablet, Take 81 mg by mouth daily. , Disp: , Rfl:    cholecalciferol (VITAMIN D3) 10 MCG (400 UNIT) TABS tablet, Take 1,000 Units by mouth daily., Disp: , Rfl:    Coenzyme Q10 (CO Q-10) 100 MG CAPS, Take 100 mg by mouth daily., Disp: , Rfl:    Cranberry 400 MG CAPS, Take 1 capsule by mouth daily., Disp: , Rfl:    ezetimibe (ZETIA) 10 MG  tablet, Take 10 mg by mouth daily., Disp: , Rfl:    Glucosamine-Chondroitin 750-600 MG TABS, Take 1 tablet by mouth daily., Disp: , Rfl:    hydrochlorothiazide (HYDRODIURIL) 25 MG tablet, Take 12.5 mg by mouth daily., Disp: , Rfl:    Krill Oil 500 MG CAPS, Take 500 mg by mouth daily., Disp: , Rfl:    levothyroxine (SYNTHROID, LEVOTHROID) 25 MCG tablet, Take 25 mcg by mouth daily before breakfast., Disp: , Rfl:    losartan (COZAAR) 50 MG tablet, Take 1 tablet by mouth daily., Disp: , Rfl:    Multiple Vitamins tablet, Take 1 tablet by mouth daily., Disp: , Rfl:    pantoprazole (PROTONIX) 40 MG tablet, Take 40 mg by mouth every other day., Disp: , Rfl:    rosuvastatin (CRESTOR) 5 MG tablet, Take 5 mg by mouth once a week. Thursday night, Disp: , Rfl:    sertraline (ZOLOFT) 50 MG tablet, Take 50 mg by mouth daily., Disp: , Rfl:    ondansetron (ZOFRAN-ODT) 4 MG disintegrating tablet, Take 1 tablet (4 mg total) by mouth every 8 (eight) hours as needed for nausea or vomiting. (Patient not taking: Reported on 03/18/2023), Disp: 20 tablet, Rfl: 0   oxyCODONE-acetaminophen (PERCOCET) 5-325 MG tablet, Take 1-2 tablets by mouth every 6 (six) hours as needed for severe pain. Max 6 tabs per day, Disp: 30 tablet, Rfl: 0   Physical exam:  Vitals:   03/18/23 1047  BP: (!) 147/73  Pulse: 60  Resp: 16  Temp: (!) 96.7 F (35.9 C)  TempSrc: Tympanic  SpO2: 100%  Weight: 193 lb 14.4 oz (88 kg)  Height: 5\' 5"  (1.651 m)   Physical Exam Cardiovascular:     Rate and Rhythm: Normal rate and regular rhythm.     Heart sounds: Normal heart sounds.  Pulmonary:     Effort: Pulmonary effort is normal.     Breath sounds: Normal breath sounds.  Abdominal:     General: Bowel sounds are normal.     Palpations: Abdomen is soft.  Skin:    General: Skin is warm and dry.  Neurological:     Mental Status: She is alert and oriented to person, place, and time.           Latest Ref Rng & Units 01/21/2023    9:56 AM   CMP  Glucose 70 - 99 mg/dL 488   BUN 8 - 23 mg/dL 15   Creatinine 8.91 - 1.00 mg/dL 6.94   Sodium 503 - 888 mmol/L 137   Potassium 3.5 - 5.1 mmol/L 4.2   Chloride 98 - 111 mmol/L 105   CO2 22 -  32 mmol/L 24   Calcium 8.9 - 10.3 mg/dL 8.9   Total Protein 6.5 - 8.1 g/dL 7.5   Total Bilirubin 0.3 - 1.2 mg/dL 0.8   Alkaline Phos 38 - 126 U/L 103   AST 15 - 41 U/L 30   ALT 0 - 44 U/L 31       Latest Ref Rng & Units 03/04/2023    9:10 AM  CBC  WBC 4.0 - 10.5 K/uL 6.0   Hemoglobin 12.0 - 15.0 g/dL 28.4   Hematocrit 13.2 - 46.0 % 40.4   Platelets 150 - 400 K/uL 482     No images are attached to the encounter.  MM 3D SCREEN BREAST BILATERAL  Result Date: 03/14/2023 CLINICAL DATA:  Screening. EXAM: DIGITAL SCREENING BILATERAL MAMMOGRAM WITH TOMOSYNTHESIS AND CAD TECHNIQUE: Bilateral screening digital craniocaudal and mediolateral oblique mammograms were obtained. Bilateral screening digital breast tomosynthesis was performed. The images were evaluated with computer-aided detection. COMPARISON:  Previous exam(s). ACR Breast Density Category b: There are scattered areas of fibroglandular density. FINDINGS: There are no findings suspicious for malignancy. IMPRESSION: No mammographic evidence of malignancy. A result letter of this screening mammogram will be mailed directly to the patient. RECOMMENDATION: Screening mammogram in one year. (Code:SM-B-01Y) BI-RADS CATEGORY  1: Negative. Electronically Signed   By: Bary Richard M.D.   On: 03/14/2023 11:13   CT ABDOMINAL MASS BIOPSY  Result Date: 03/04/2023 INDICATION: History of endometrial cancer, right adnexal pelvic mass which is PET positive concerning for metastatic disease. EXAM: CT-GUIDED BIOPSY RIGHT ADNEXAL PELVIC MASS MEDICATIONS: 1% lidocaine local ANESTHESIA/SEDATION: 1.0 mg IV Versed; 100 mcg IV Fentanyl Moderate Sedation Time:  10 minutes The patient was continuously monitored during the procedure by the interventional radiology nurse  under my direct supervision. PROCEDURE: The procedure, risks, benefits, and alternatives were explained to the patient. Questions regarding the procedure were encouraged and answered. The patient understands and consents to the procedure. Previous imaging reviewed. Patient position prone. Noncontrast localization CT performed. The right pelvic adnexal mass was localized and marked for a right trans gluteal approach. Under sterile conditions and local anesthesia, the 17 gauge 11.8 cm guide was advanced from a right transgluteal approach to the right pelvic adnexal mass. Needle position confirmed with CT. 18 gauge core biopsies obtained. Patient tolerated the procedure well without complication. Vital sign monitoring by nursing staff during the procedure will continue as patient is in the special procedures unit for post procedure observation. FINDINGS: The images document guide needle placement within the right adnexal mass for core biopsy. Post biopsy images demonstrate no hemorrhage or hematoma. COMPLICATIONS: None immediate. IMPRESSION: Successful CT-guided core biopsy of the right pelvic mass. Marland Kitchen RADIATION DOSE REDUCTION: This exam was performed according to the departmental dose-optimization program which includes automated exposure control, adjustment of the mA and/or kV according to patient size and/or use of iterative reconstruction technique. Electronically Signed   By: Judie Petit.  Shick M.D.   On: 03/04/2023 12:18    Assessment and plan- Patient is a 72 y.o. female with recurrent endometrioid endometrial carcinoma here to discuss further management  I have reviewed PET CT scan images independently and discussed findings with the patient.  She had a history of stage Ic well-differentiated endometrioid endometrial carcinoma back in 2009 s/p surgery and adjuvant brachytherapy.  She was incidentally noted to have a 3 cm right adnexal mass which has been biopsied and consistent with endometrioid endometrial  carcinoma.  PET CT scan does not show any other evidence of distant  metastatic disease or local regional adenopathy.  Patient met with GYN oncology and surgical option was discussed with her.  This would also mean that she would need a temporary colostomy which would be potentially reversed in the future.  Nonsurgical options include external beam radiation therapy followed by maintenance hormone therapy given that her tumor is strongly ER/PR positive.  She is here for medical oncology opinion.  A double-blind trial ([id number]) assigned 816 patients with stage III to IVB or recurrent EC to pembrolizumab or placebo, in combination with paclitaxel and carboplatin, followed by pembrolizumab or placebo for up to 14 maintenance cycles every six weeks. Overall, median PFS was 18.8 versus 8.5 months with pembrolizumab versus placebo, respectively.In the dMMR cohort, median PFS was not reached with pembrolizumab versus 7.6 months with placebo, 6-month PFS was 74 versus 38 percent, respectively (HR 0.30, 95% CI 0.19-0.48).   Given that she has localized disease surgery or radiation would be preferred.  However systemic chemotherapy with 6 cycles of CarboTaxol chemotherapy IV every 3 weeks is also a consideration.  Discussed risks and benefits of chemotherapy including all but not limited to nausea, vomiting, low blood counts, risk of infections and hospitalizations.  Risk of peripheral neuropathy and infusion reaction associated with carboplatin and Taxol.  Treatment will be given with a palliative intent.  MMR status is currently pending.  If she is MSI unstable, adding immunotherapy to chemotherapy would be the recommendation.  In MSI stable or proficient MMR it is unclear of immunotherapy improves over all survival.  There was benefit noted in progression free survival.  GYN oncology is also looking into if she would be a candidate for clinical trial after completion of chemotherapy.  We are also awaiting HER2  testing and if she is HER2 positive addition of trastuzumab chemotherapy is an option as well.  Patient would like to meet with radiation oncology as well and then decide which option she wants to pursue.  Follow-up with me to be decided based on patient decision   Thank you for this kind referral and the opportunity to participate in the care of this patient   Visit Diagnosis 1. Endometrial cancer   2. Goals of care, counseling/discussion     Dr. Owens Shark, MD, MPH [facility] [phone] 03/20/2023

## 2023-03-23 ENCOUNTER — Ambulatory Visit
Admission: RE | Admit: 2023-03-23 | Discharge: 2023-03-23 | Disposition: A | Discharge status: Home / Self Care | Payer: Medicare Other | Source: Ambulatory Visit | Attending: Radiation Oncology | Admitting: Radiation Oncology

## 2023-03-23 ENCOUNTER — Inpatient Hospital Stay (HOSPITAL_BASED_OUTPATIENT_CLINIC_OR_DEPARTMENT_OTHER): Payer: Medicare Other | Admitting: Obstetrics and Gynecology

## 2023-03-23 VITALS — BP 161/88 | HR 71 | Resp 18 | Ht 65.0 in | Wt 193.0 lb

## 2023-03-23 DIAGNOSIS — C7989 Secondary malignant neoplasm of other specified sites: Secondary | ICD-10-CM

## 2023-03-23 DIAGNOSIS — C549 Malignant neoplasm of corpus uteri, unspecified: Secondary | ICD-10-CM

## 2023-03-23 DIAGNOSIS — Z8542 Personal history of malignant neoplasm of other parts of uterus: Secondary | ICD-10-CM | POA: Diagnosis not present

## 2023-03-23 DIAGNOSIS — C541 Malignant neoplasm of endometrium: Secondary | ICD-10-CM

## 2023-03-23 NOTE — Consult Note (Signed)
NEW PATIENT EVALUATION  Name: Morgan Dalton  MRN: [id number]  Date:   03/23/2023     DOB: 12/13/1950   This 72 y.o. female patient presents to the clinic for initial evaluation of pelvic recurrence of endometrial cancer now out 15 years from vaginal brachytherapy.  REFERRING PHYSICIAN: Lynnea FerrierKlein, Bert J III, MD  CHIEF COMPLAINT: No chief complaint on file.   DIAGNOSIS: There were no encounter diagnoses.   PREVIOUS INVESTIGATIONS:  PET scan CT scans reviewed Pathology report reviewed Clinical notes reviewed  HPI: Patient is a 72 year old female treated back in 2009 status post robotic assisted bilateral salpingo-oophorectomy and bilateral pelvic node dissection.  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  She underwent adjuvant vaginal brachytherapy in our department completed in February 2009.  She recently presented with abdominal pain and does have a history of diverticulitis and was found to have a 3.2 cm mass in the right posterior adnexa concerning for possible ovarian neoplasm.  PET scan demonstrated hypermetabolic solid mass in the right pelvis consistent with ovarian neoplasm.  No evidence of metastatic adenopathy in the pelvis or periaortic region were noted.  She underwent CT-guided biopsy which was positive for metastatic adenocarcinoma compatible with endometrioid adenocarcinoma.  She is seen today for consideration of treatment.  DNA mismatch repair testing is pending.  Tumor is strongly progesterone receptor positive.  Since her biopsy she still having some lower abdominal discomfort.  PLANNED TREATMENT REGIMEN: Whole pelvic radiation therapy with boost to known tumor involvement  PAST MEDICAL HISTORY:  has a past medical history of Ankle fracture, left, Anxiety, Cancer, Dyspepsia, Endometrial cancer, GERD (gastroesophageal reflux disease), History of kidney stones, dysplastic nevus (10/16/2010), Hypercholesterolemia, Hyperlipidemia,  Hypertension, Hypothyroidism, Kidney stone, Osteopenia, and Squamous cell carcinoma of skin (08/30/2019).    PAST SURGICAL HISTORY:  Past Surgical History:  Procedure Laterality Date   ABDOMINAL HYSTERECTOMY     CATARACT EXTRACTION W/PHACO Right 12/07/2016   Procedure: CATARACT EXTRACTION PHACO AND INTRAOCULAR LENS PLACEMENT (IOC);  Surgeon: Galen ManilaWilliam Porfilio, MD;  Location: [facility] ORS;  Service: Ophthalmology;  Laterality: Right;  US 42.5 AP% 23.2 CDE 9.87 Fluid pack lot # [id number] H   CATARACT EXTRACTION W/PHACO Left 01/04/2017   Procedure: CATARACT EXTRACTION PHACO AND INTRAOCULAR LENS PLACEMENT (IOC);  Surgeon: Galen ManilaWilliam Porfilio, MD;  Location: [facility] ORS;  Service: Ophthalmology;  Laterality: Left;  US 39.2 AP% 23.9 CDE 9.38 Fluid Pack Lot # [id number] H   CHOLECYSTECTOMY  2014   COLONOSCOPY  2014   COLONOSCOPY WITH ESOPHAGOGASTRODUODENOSCOPY (EGD)  2004   COLONOSCOPY WITH PROPOFOL N/A 11/07/2018   Procedure: COLONOSCOPY WITH PROPOFOL;  Surgeon: Christena DeemSkulskie, Martin U, MD;  [facility];  Service: Endoscopy;  Laterality: N/A;   DILATION AND CURETTAGE OF UTERUS     EXTRACORPOREAL SHOCK WAVE LITHOTRIPSY     EYE SURGERY     GANGLION CYST EXCISION Right    wrist   HALLUX VALGUS LAPIDUS Left 08/11/2022   Procedure: HALLUX VALGUS LAPIDUS;  Surgeon: Gwyneth RevelsFowler, Justin, DPM;  Location: [facility] ORS;  Service: Podiatry;  Laterality: Left;   KIDNEY STONE SURGERY     several   KNEE ARTHROSCOPY Left 2014   partial medial meniscectomy, medial chondroplasty   NM PET TUMOR IMAGING  02/07/2023   TOTAL ABDOMINAL HYSTERECTOMY W/ BILATERAL SALPINGOOPHORECTOMY  2009    FAMILY HISTORY: family history includes Breast cancer in her paternal aunt.  SOCIAL HISTORY:  reports that she has never smoked. She has never used smokeless tobacco. She reports that she does not drink  alcohol and does not use drugs.  ALLERGIES: Statins  MEDICATIONS:  Current Outpatient Medications  Medication Sig Dispense Refill    aspirin EC 81 MG tablet Take 81 mg by mouth daily.      cholecalciferol (VITAMIN D3) 10 MCG (400 UNIT) TABS tablet Take 1,000 Units by mouth daily.     Coenzyme Q10 (CO Q-10) 100 MG CAPS Take 100 mg by mouth daily.     Cranberry 400 MG CAPS Take 1 capsule by mouth daily.     ezetimibe (ZETIA) 10 MG tablet Take 10 mg by mouth daily.     Glucosamine-Chondroitin 750-600 MG TABS Take 1 tablet by mouth daily.     hydrochlorothiazide (HYDRODIURIL) 25 MG tablet Take 12.5 mg by mouth daily.     Krill Oil 500 MG CAPS Take 500 mg by mouth daily.     levothyroxine (SYNTHROID, LEVOTHROID) 25 MCG tablet Take 25 mcg by mouth daily before breakfast.     losartan (COZAAR) 50 MG tablet Take 1 tablet by mouth daily.     Multiple Vitamins tablet Take 1 tablet by mouth daily.     pantoprazole (PROTONIX) 40 MG tablet Take 40 mg by mouth every other day.     rosuvastatin (CRESTOR) 5 MG tablet Take 5 mg by mouth once a week. Thursday night     sertraline (ZOLOFT) 50 MG tablet Take 50 mg by mouth daily.     No current facility-administered medications for this encounter.    ECOG PERFORMANCE STATUS:  1 - Symptomatic but completely ambulatory  REVIEW OF SYSTEMS: Patient denies any weight loss, fatigue, weakness, fever, chills or night sweats. Patient denies any loss of vision, blurred vision. Patient denies any ringing  of the ears or hearing loss. No irregular heartbeat. Patient denies heart murmur or history of fainting. Patient denies any chest pain or pain radiating to her upper extremities. Patient denies any shortness of breath, difficulty breathing at night, cough or hemoptysis. Patient denies any swelling in the lower legs. Patient denies any nausea vomiting, vomiting of blood, or coffee ground material in the vomitus. Patient denies any stomach pain. Patient states has had normal bowel movements no significant constipation or diarrhea. Patient denies any dysuria, hematuria or significant nocturia. Patient denies  any problems walking, swelling in the joints or loss of balance. Patient denies any skin changes, loss of hair or loss of weight. Patient denies any excessive worrying or anxiety or significant depression. Patient denies any problems with insomnia. Patient denies excessive thirst, polyuria, polydipsia. Patient denies any swollen glands, patient denies easy bruising or easy bleeding. Patient denies any recent infections, allergies or URI. Patient "s visual fields have not changed significantly in recent time.   PHYSICAL EXAM: There were no vitals taken for this visit. Pelvic exam was performed with Dr. Johnnette Litter.  On rectal exam there is a small nodule at the distal rectum.  Vaginal mucosa shows atrophic changes.  Well-developed well-nourished patient in NAD. HEENT reveals PERLA, EOMI, discs not visualized.  Oral cavity is clear. No oral mucosal lesions are identified. Neck is clear without evidence of cervical or supraclavicular adenopathy. Lungs are clear to A&P. Cardiac examination is essentially unremarkable with regular rate and rhythm without murmur rub or thrill. Abdomen is benign with no organomegaly or masses noted. Motor sensory and DTR levels are equal and symmetric in the upper and lower extremities. Cranial nerves II through XII are grossly intact. Proprioception is intact. No peripheral adenopathy or edema is identified. No motor or  sensory levels are noted. Crude visual fields are within normal range.  LABORATORY DATA: Pathology reports reviewed    RADIOLOGY RESULTS: CT scans PET scan and MRI scan all reviewed compatible with above-stated findings   IMPRESSION: Recurrent endometrial carcinoma in a right adnexal region and patient now out 15 years from vaginal brachytherapy for endometrial carcinoma.  PLAN: At this time I discussed the case with Dr. Johnnette Litter we would treat with 45 Wallace Cullens to her past pelvis using IMRT treatment planning and delivery.  This will allow [place] to spare critical  structures such as her bladder rectum and cut down on bowel side effects.  I will also target her PET positive adnexal mass and boost that another 10 Wallace Cullens trying to spare as much adjacent structures such as her sigmoid colon as possible.  Risks and benefits of treatment including possible diarrhea increased lower urinary tract symptoms fatigue alteration blood counts skin reaction all were reviewed in detail with the patient.  I personally set up and ordered CT simulation for next week.  I would like to take this opportunity to thank you for allowing me to participate in the care of your patient.Carmina Miller, MD

## 2023-03-23 NOTE — Progress Notes (Signed)
Gynecologic Oncology Consult Visit   Referring Provider: Dr. Feliberto Gottron  Chief Complaint: Right pelvic recurrence of grade 1 endometrial cancer Subjective:  CHAYNA SURRATT is a 72 y.o. female who is seen in consultation from Dr. Graciela Husbands with history of endometrial cancer in 2009 who presents for discussion of imaging for incidental right pelvic mass.   She presents today for discussion of management options.   Gynecologic History:  She underwent vaginal hysterectomy with Dr Feliberto Gottron at [facility] in 2009. 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 She was referred to Dr. Hyacinth Meeker at [facility] and underwent robotic assisted bilateral salpino-oophorectomy, bilateral pelvic lymph node dissection, supraumbilical laparotomy and periaortic lymph node dissection. Surgery complicated by adhesions. Final pathology revealed a deeply invasive (two thirds), well differentiated endometrioid adenocarcinoma, stage Ic. Washings cervix and adnexa were negative. She elected for adjuvant vaginal brachytherapy at [facility] and completed treatment 02/02/2008.   Patient went to ER for RUQ abdominal pain. CT 01/21/23 showed diverticulitis at hepatic flexure with some free air. Treated with antibiotics for diverticulitis.  Incidental finding of solid mass of right posterior adnexa measuring 3.2 x 2.2 cm, new since previous CT scan in 2020. She also has a large staghorn calculus in the right kidney.  She was seen by Dr. Feliberto Gottron in [place].   Patient provided [place] with her records from her endometrial cancer which were reviewed in detail, copied, and scanned into her chart.  Last colonoscopy 11/07/2018- report reviewed and showed tortuous colon, diverticulosis, recommendation to repeat in 5 years (2024)  01/30/23- MRI Abdomen and Pelvis w wo contrast S/p hysterectomy. Left ovary not visualized. Solid heterogeneously enhancing right ovarian lesion measuring 3.0 x 1.9 x 2.3 cm. No ascites. No  lymphadenopathy. Inflammation of proximal transverse colon consistent with residual diverticulitis.  MRI was addended to reflect that mass was present on CT from 2020 and measured 1.6 x 1.2 cm at that time. No definite communication with the colon or small bowel loops and the appendix is identified as separate from this lesion.   HE4- 142 (elevated) CA 125 - 18 (normal) ROMA- 30.57% (high risk)  01/25/23- Pap- NILM  A PET scan was performed to better ascertain the etiology of the mass. 02/10/23- PET IMPRESSION: 1. Hypermetabolic solid mass in the RIGHT pelvis consistent with ovarian neoplasm. 2. No evidence of metastatic adenopathy in the pelvis or periaortic retroperitoneum. 3. No evidence of metastatic disease outside the pelvis. 4. Post hysterectomy anatomy. No abnormal metabolic activity associated with the vagina. 5. RIGHT renal staghorn calculus.  Imaging was reviewed by Dr. Amil Amen. Report was addended to reflect that based on her prior surgical hysterectomy and oophorectomy, intensely hypermetabolic solid mass is favored to be a neoplastic deposit, potentially from remote endometrial carcinoma.   03/04/23 Image guided biopsy of right pelvic mass. DIAGNOSIS: A. SOFT TISSUE, RIGHT ADNEXAL; CT-GUIDED CORE NEEDLE BIOPSY: - POSITIVE FOR MALIGNANCY. - METASTATIC ADENOCARCINOMA, COMPATIBLE WITH ENDOMETRIOID ADENOCARCINOMA.  Comment: The patient's prior history of endometrioid adenocarcinoma in 2009 is noted.  Biopsy sections display an abnormal cribriform and somewhat papillary glandular proliferation, histologically compatible with the patient's previously diagnosed endometrioid adenocarcinoma. Immunohistochemical studies show tumor cells to be positive for pancytokeratin, PAX8, estrogen receptor (greater than 90%, strong), and progesterone receptor (greater than 90%, strong).  Tumor cells are negative for CK7, WT1, CDX2, and Napsin A.  P53 demonstrates a wild-type (non-mutated)  pattern of expression.  Taken together, the morphologic and immunophenotypic findings support the above diagnosis.   Problem List: Patient Active Problem List  Diagnosis Date Noted   Endometrial cancer 03/20/2023   GERD (gastroesophageal reflux disease) 12/31/2016   History of nephrolithiasis 12/31/2016   Hyperlipidemia, unspecified 12/31/2016   Insomnia 12/31/2016   Osteopenia 12/31/2016   Acquired hypothyroidism 10/26/2016   Bladder neoplasm of uncertain malignant potential 09/09/2016   Bladder pain 08/11/2016   Chronic cystitis 08/11/2016   Mild episode of recurrent major depressive disorder 07/23/2016   History of endometrial cancer 01/23/2016   Renal colic 06/19/2014   Right upper quadrant pain 08/07/2012   Kidney stone 08/07/2012   Gross hematuria 08/07/2012   Malignant neoplasm of corpus uteri 08/07/2012   Microscopic hematuria 08/07/2012   Urinary tract infection 08/07/2012    Past Medical History: Past Medical History:  Diagnosis Date   Ankle fracture, left    Anxiety    Cancer    Uterine Cancer   Dyspepsia    Endometrial cancer    GERD (gastroesophageal reflux disease)    History of kidney stones    Hx of dysplastic nevus 10/16/2010   Right lower leg. Severe atypia. Excised: 11/25/2010. 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 Well differentiated. Tx: [facility]    Past Surgical History: Past Surgical History:  Procedure Laterality Date   ABDOMINAL HYSTERECTOMY     CATARACT EXTRACTION W/PHACO Right 12/07/2016   Procedure: CATARACT EXTRACTION PHACO AND INTRAOCULAR LENS PLACEMENT (IOC);  Surgeon: Galen Manila, MD;  Location: [facility] ORS;  Service: Ophthalmology;  Laterality: Right;  [place] 42.5 AP% 23.2 CDE 9.87 Fluid pack lot # [id number] H   CATARACT EXTRACTION W/PHACO Left 01/04/2017   Procedure: CATARACT EXTRACTION PHACO  AND INTRAOCULAR LENS PLACEMENT (IOC);  Surgeon: Galen Manila, MD;  Location: [facility] ORS;  Service: Ophthalmology;  Laterality: Left;  [place] 39.2 AP% 23.9 CDE 9.38 Fluid Pack Lot # [id number]   CHOLECYSTECTOMY  2014   COLONOSCOPY  2014   COLONOSCOPY WITH ESOPHAGOGASTRODUODENOSCOPY (EGD)  2004   COLONOSCOPY WITH PROPOFOL N/A 11/07/2018   Procedure: COLONOSCOPY WITH PROPOFOL;  Surgeon: Christena Deem, MD;  Location: [facility] ENDOSCOPY;  Service: Endoscopy;  Laterality: N/A;   DILATION AND CURETTAGE OF UTERUS     EXTRACORPOREAL SHOCK WAVE LITHOTRIPSY     EYE SURGERY     GANGLION CYST EXCISION Right    wrist   HALLUX VALGUS LAPIDUS Left 08/11/2022   Procedure: HALLUX VALGUS LAPIDUS;  Surgeon: Gwyneth Revels, DPM;  Location: [facility] ORS;  Service: Podiatry;  Laterality: Left;   KIDNEY STONE SURGERY     several   KNEE ARTHROSCOPY Left 2014   partial medial meniscectomy, medial chondroplasty   NM PET TUMOR IMAGING  02/07/2023   TOTAL ABDOMINAL HYSTERECTOMY W/ BILATERAL SALPINGOOPHORECTOMY  2009    Past Gynecologic History:  G2P2 Post menopausal Menarche: age 89  OB History:  OB History  No obstetric history on file.    Family History: Family History  Problem Relation Age of Onset   Breast cancer Paternal Aunt     Social History: Social History   Socioeconomic History   Marital status: Married    Spouse name: Molly Maduro   Number of children: 2   Years of education: Not on file   Highest education level: Not on file  Occupational History   Not on file  Tobacco Use   Smoking status: Never   Smokeless tobacco: Never  Vaping Use  Vaping Use: Never used  Substance and Sexual Activity   Alcohol use: No   Drug use: No   Sexual activity: Not on file  Other Topics Concern   Not on file  Social History Narrative   Not on file   Social Determinants of Health   Financial Resource Strain: Not on file  Food Insecurity: No Food Insecurity (03/18/2023)   Hunger Vital Sign    Worried  About Running Out of Food in the Last Year: Never true    Ran Out of Food in the Last Year: Never true  Transportation Needs: No Transportation Needs (03/18/2023)   PRAPARE - Administrator, Civil ServiceTransportation    Lack of Transportation (Medical): No    Lack of Transportation (Non-Medical): No  Physical Activity: Not on file  Stress: Not on file  Social Connections: Not on file  Intimate Partner Violence: Not At Risk (03/18/2023)   Humiliation, Afraid, Rape, and Kick questionnaire    Fear of Current or Ex-Partner: No    Emotionally Abused: No    Physically Abused: No    Sexually Abused: No    Allergies: Allergies  Allergen Reactions   Statins Other (See Comments)    Myalgia     Current Medications: Current Outpatient Medications  Medication Sig Dispense Refill   aspirin EC 81 MG tablet Take 81 mg by mouth daily.      cholecalciferol (VITAMIN D3) 10 MCG (400 UNIT) TABS tablet Take 1,000 Units by mouth daily.     Coenzyme Q10 (CO Q-10) 100 MG CAPS Take 100 mg by mouth daily.     Cranberry 400 MG CAPS Take 1 capsule by mouth daily.     ezetimibe (ZETIA) 10 MG tablet Take 10 mg by mouth daily.     Glucosamine-Chondroitin 750-600 MG TABS Take 1 tablet by mouth daily.     hydrochlorothiazide (HYDRODIURIL) 25 MG tablet Take 12.5 mg by mouth daily.     Krill Oil 500 MG CAPS Take 500 mg by mouth daily.     levothyroxine (SYNTHROID, LEVOTHROID) 25 MCG tablet Take 25 mcg by mouth daily before breakfast.     losartan (COZAAR) 50 MG tablet Take 1 tablet by mouth daily.     Multiple Vitamins tablet Take 1 tablet by mouth daily.     ondansetron (ZOFRAN-ODT) 4 MG disintegrating tablet Take 1 tablet (4 mg total) by mouth every 8 (eight) hours as needed for nausea or vomiting. (Patient not taking: Reported on 03/18/2023) 20 tablet 0   pantoprazole (PROTONIX) 40 MG tablet Take 40 mg by mouth every other day.     rosuvastatin (CRESTOR) 5 MG tablet Take 5 mg by mouth once a week. Thursday night     sertraline (ZOLOFT) 50  MG tablet Take 50 mg by mouth daily.     No current facility-administered medications for this visit.   Review of Systems General:  no complaints Skin: no complaints Eyes: no complaints HEENT: no complaints Breasts: no complaints Pulmonary: no complaints Cardiac: no complaints Gastrointestinal: no complaints Genitourinary/Sexual: spotting x 1 day after pelvic exam Ob/Gyn: no complaints Musculoskeletal: no complaints Hematology: no complaints Neurologic/Psych: no complaints  Objective:  Physical Examination:  There were no vitals taken for this visit.    ECOG Performance Status: 0 - Asymptomatic  GENERAL: Patient is a well appearing female in no acute distress HEENT:  Sclera clear. Anicteric NODES:  Negative axillary, supraclavicular, inguinal lymph node survery LUNGS:  Clear to auscultation bilaterally.   HEART:  Regular rate and rhythm.  ABDOMEN:  Soft, nontender.  No hernias, incisions well healed. No masses or ascites EXTREMITIES:  No peripheral edema. Atraumatic. No cyanosis SKIN:  Clear with no obvious rashes or skin changes.  NEURO:  Nonfocal. Well oriented.  Appropriate affect.  Pelvic: Exam chaperoned by NP EGBUS: no lesions Cervix, uterus, ovaries: surgically absent Vagina: very atrophic and white.  Adnexa: no palpable masses Rectovaginal: I can feel the small mass on the right and it is smooth and adjacent to the rectum.   Lab Review Labs on site today: No labs on site today  Radiologic Imaging: Per hpi    Assessment:  ALDONA SONNEK is a 72 y.o. female diagnosed with grade 1 deeply invasive endometrioid endometrial cancer in 2009, now with recurrence in pelvis.  She underwent vaginal hysterectomy with Dr Feliberto Gottron at [facility] in 2009. 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 She was referred to Dr. Hyacinth Meeker at [facility] and underwent robotic assisted bilateral salpino-oophorectomy, bilateral pelvic lymph node dissection,  supraumbilical laparotomy and periaortic lymph node dissection. Surgery complicated by adhesions. Final pathology revealed a deeply invasive (two thirds), well differentiated endometrioid adenocarcinoma, stage Ic. Washings cervix and adnexa were negative. She elected for adjuvant vaginal brachytherapy at [facility] and completed treatment 02/02/2008.   Presented to the ED 01/21/23 with RUQ pain and CT scan showed diverticulitis in the hepatic flexure with some small amounts of free air and treated with antibiotics. CT scan showed 3 cm heterogeneous mass in right posterior pelvis (reported to be ovarian mass, but ovaries removed previously).  CA125 normal, HE4 elevated.  MRI done for further characterization and all imaging reviewed with radiology.  They believe that the mass is attached to the right vaginal fornix and not to the colon or appendix and that it was present, but smaller in 2020.  PET scan ordered and shows that the right pelvic mass has a high SUV and is concerning for cancer.  No evidence of metastatic disease.  Image guided biopsy 3/24 showed recurrent endometrioid cancer with strong ER/PR expression and TP53 wild-type.  MMR IHC pending.   Vaginal exam is notable for severe radiation changes with atrophy and adhesions. So it is difficult to observe the fornices.    Medical co-morbidities complicating care: diverticulitis, hypothyroid.  Plan:   Problem List Items Addressed This Visit       Genitourinary   Malignant neoplasm of corpus uteri - Primary    Discussed case at our weekly [facility] Tumor Board.  External pelvic radiation would be the most standard approach for a local recurrence of endometrial cancer.  Surgery is problematic in the sense that a radical procedure involving resection of adjacent colon would likely be needed, and we would probably want to do pelvic radiation post op anyway.  After radiation she could also be treated with maintenance progestin therapy in view of the high  ER/PR expression.  Presently, do not think systemic chemotherapy would be appropriate for an isolated recurrence many years post diagnosis.  MMR IHC pending and if she is MMR deficient then pembrolizumab would also be an option in the future if needed.    Dr Rushie Chestnut came up to clinic and examined her with me. He will plan for 4500 cGy to pelvis with boost to the mass.    We will see her back with CT scan A/P in 2 months and likely will start Megace hormonal therapy.  HE4 elevation likely related to inflammation or kidney disease.   The patient's diagnosis, an outline  of the further diagnostic and laboratory studies which will be required, the recommendation for surgery, and alternatives were discussed with her and her accompanying family members.  All questions were answered to their satisfaction.   Leida Lauth, MD  I personally interviewed and examined the patient. Agreed with the above/below plan of care. I have directly contributed to assessment and plan of care of this patient and educated and discussed with patient and family.  Leida Lauth, MD    CC:  Lynnea Ferrier, MD [address] [facility] [place],  [place] [address] [phone]

## 2023-03-24 ENCOUNTER — Ambulatory Visit: Payer: Medicare Other | Admitting: Radiation Oncology

## 2023-03-29 ENCOUNTER — Ambulatory Visit
Admission: RE | Admit: 2023-03-29 | Discharge: 2023-03-29 | Disposition: A | Discharge status: Home / Self Care | Payer: Medicare Other | Source: Ambulatory Visit | Attending: Radiation Oncology | Admitting: Radiation Oncology

## 2023-03-29 DIAGNOSIS — C541 Malignant neoplasm of endometrium: Secondary | ICD-10-CM | POA: Insufficient documentation

## 2023-03-29 DIAGNOSIS — Z510 Encounter for antineoplastic radiation therapy: Secondary | ICD-10-CM | POA: Insufficient documentation

## 2023-03-29 DIAGNOSIS — Z90710 Acquired absence of both cervix and uterus: Secondary | ICD-10-CM | POA: Diagnosis not present

## 2023-03-29 DIAGNOSIS — Z90722 Acquired absence of ovaries, bilateral: Secondary | ICD-10-CM | POA: Diagnosis not present

## 2023-03-30 ENCOUNTER — Telehealth: Payer: Medicare Other

## 2023-04-01 ENCOUNTER — Other Ambulatory Visit: Payer: Self-pay | Admitting: *Deleted

## 2023-04-01 ENCOUNTER — Telehealth: Payer: Self-pay | Admitting: *Deleted

## 2023-04-01 DIAGNOSIS — C541 Malignant neoplasm of endometrium: Secondary | ICD-10-CM

## 2023-04-01 NOTE — Telephone Encounter (Signed)
Patient called asking for Lauren to call her back  wanting to know f her path results are back yet as it has been a month    Component 4 wk ago  SURGICAL PATHOLOGY SURGICAL PATHOLOGY CASE: [id number] PATIENT: Morgan Dalton Surgical Pathology Report     Specimen Submitted: A. Adnexal mass, right pelvic  Clinical History: Endometrial CA.  MET DZ    DIAGNOSIS: A. SOFT TISSUE, RIGHT ADNEXAL; CT-GUIDED CORE NEEDLE BIOPSY: - POSITIVE FOR MALIGNANCY. - METASTATIC ADENOCARCINOMA, COMPATIBLE WITH ENDOMETRIOID ADENOCARCINOMA.

## 2023-04-05 ENCOUNTER — Telehealth: Payer: Self-pay

## 2023-04-05 DIAGNOSIS — Z510 Encounter for antineoplastic radiation therapy: Secondary | ICD-10-CM | POA: Diagnosis not present

## 2023-04-05 NOTE — Telephone Encounter (Signed)
Received voicemail from Ms. Millon stating she has left 2 messages for a return call from [employer]. Reports you were going to obtain some results from pathology and call her. Please call her.

## 2023-04-06 ENCOUNTER — Ambulatory Visit: Admission: RE | Admit: 2023-04-06 | Payer: Medicare Other | Source: Ambulatory Visit

## 2023-04-07 ENCOUNTER — Ambulatory Visit
Admission: RE | Admit: 2023-04-07 | Discharge: 2023-04-07 | Disposition: A | Discharge status: Home / Self Care | Payer: Medicare Other | Source: Ambulatory Visit | Attending: Radiation Oncology | Admitting: Radiation Oncology

## 2023-04-07 ENCOUNTER — Other Ambulatory Visit: Payer: Self-pay

## 2023-04-07 ENCOUNTER — Inpatient Hospital Stay: Payer: Medicare Other

## 2023-04-07 DIAGNOSIS — Z510 Encounter for antineoplastic radiation therapy: Secondary | ICD-10-CM | POA: Diagnosis not present

## 2023-04-07 LAB — RAD ONC ARIA SESSION SUMMARY
Course Elapsed Days: 0
Plan Fractions Treated to Date: 1
Plan Prescribed Dose Per Fraction: 2 Gy
Plan Total Fractions Prescribed: 25
Plan Total Prescribed Dose: 50 Gy
Reference Point Dosage Given to Date: 2 Gy
Reference Point Session Dosage Given: 2 Gy
Session Number: 1

## 2023-04-07 LAB — SURGICAL PATHOLOGY

## 2023-04-08 ENCOUNTER — Ambulatory Visit
Admission: RE | Admit: 2023-04-08 | Discharge: 2023-04-08 | Disposition: A | Discharge status: Home / Self Care | Payer: Medicare Other | Source: Ambulatory Visit | Attending: Radiation Oncology | Admitting: Radiation Oncology

## 2023-04-08 ENCOUNTER — Other Ambulatory Visit: Payer: Self-pay

## 2023-04-08 DIAGNOSIS — Z510 Encounter for antineoplastic radiation therapy: Secondary | ICD-10-CM | POA: Diagnosis not present

## 2023-04-08 LAB — RAD ONC ARIA SESSION SUMMARY
Course Elapsed Days: 1
Plan Fractions Treated to Date: 2
Plan Prescribed Dose Per Fraction: 2 Gy
Plan Total Fractions Prescribed: 25
Plan Total Prescribed Dose: 50 Gy
Reference Point Dosage Given to Date: 4 Gy
Reference Point Session Dosage Given: 2 Gy
Session Number: 2

## 2023-04-11 ENCOUNTER — Ambulatory Visit
Admission: RE | Admit: 2023-04-11 | Discharge: 2023-04-11 | Disposition: A | Discharge status: Home / Self Care | Payer: Medicare Other | Source: Ambulatory Visit | Attending: Radiation Oncology | Admitting: Radiation Oncology

## 2023-04-11 ENCOUNTER — Other Ambulatory Visit: Payer: Self-pay

## 2023-04-11 DIAGNOSIS — Z510 Encounter for antineoplastic radiation therapy: Secondary | ICD-10-CM | POA: Diagnosis not present

## 2023-04-11 LAB — RAD ONC ARIA SESSION SUMMARY
Course Elapsed Days: 4
Plan Fractions Treated to Date: 3
Plan Prescribed Dose Per Fraction: 2 Gy
Plan Total Fractions Prescribed: 25
Plan Total Prescribed Dose: 50 Gy
Reference Point Dosage Given to Date: 6 Gy
Reference Point Session Dosage Given: 2 Gy
Session Number: 3

## 2023-04-12 ENCOUNTER — Ambulatory Visit
Admission: RE | Admit: 2023-04-12 | Discharge: 2023-04-12 | Disposition: A | Discharge status: Home / Self Care | Payer: Medicare Other | Source: Ambulatory Visit | Attending: Radiation Oncology | Admitting: Radiation Oncology

## 2023-04-12 ENCOUNTER — Other Ambulatory Visit: Payer: Self-pay

## 2023-04-12 DIAGNOSIS — Z510 Encounter for antineoplastic radiation therapy: Secondary | ICD-10-CM | POA: Diagnosis not present

## 2023-04-12 LAB — RAD ONC ARIA SESSION SUMMARY
Course Elapsed Days: 5
Plan Fractions Treated to Date: 4
Plan Prescribed Dose Per Fraction: 2 Gy
Plan Total Fractions Prescribed: 25
Plan Total Prescribed Dose: 50 Gy
Reference Point Dosage Given to Date: 8 Gy
Reference Point Session Dosage Given: 2 Gy
Session Number: 4

## 2023-04-13 ENCOUNTER — Other Ambulatory Visit: Payer: Self-pay

## 2023-04-13 ENCOUNTER — Ambulatory Visit
Admission: RE | Admit: 2023-04-13 | Discharge: 2023-04-13 | Disposition: A | Discharge status: Home / Self Care | Payer: Medicare Other | Source: Ambulatory Visit | Attending: Radiation Oncology | Admitting: Radiation Oncology

## 2023-04-13 DIAGNOSIS — Z90710 Acquired absence of both cervix and uterus: Secondary | ICD-10-CM | POA: Diagnosis not present

## 2023-04-13 DIAGNOSIS — Z510 Encounter for antineoplastic radiation therapy: Secondary | ICD-10-CM | POA: Insufficient documentation

## 2023-04-13 DIAGNOSIS — C541 Malignant neoplasm of endometrium: Secondary | ICD-10-CM | POA: Insufficient documentation

## 2023-04-13 DIAGNOSIS — R300 Dysuria: Secondary | ICD-10-CM | POA: Diagnosis not present

## 2023-04-13 DIAGNOSIS — Z90722 Acquired absence of ovaries, bilateral: Secondary | ICD-10-CM | POA: Insufficient documentation

## 2023-04-13 LAB — RAD ONC ARIA SESSION SUMMARY
Course Elapsed Days: 6
Plan Fractions Treated to Date: 5
Plan Prescribed Dose Per Fraction: 2 Gy
Plan Total Fractions Prescribed: 25
Plan Total Prescribed Dose: 50 Gy
Reference Point Dosage Given to Date: 10 Gy
Reference Point Session Dosage Given: 2 Gy
Session Number: 5

## 2023-04-14 ENCOUNTER — Inpatient Hospital Stay: Payer: Medicare Other

## 2023-04-14 ENCOUNTER — Other Ambulatory Visit: Payer: Self-pay

## 2023-04-14 ENCOUNTER — Ambulatory Visit
Admission: RE | Admit: 2023-04-14 | Discharge: 2023-04-14 | Disposition: A | Discharge status: Home / Self Care | Payer: Medicare Other | Source: Ambulatory Visit | Attending: Radiation Oncology | Admitting: Radiation Oncology

## 2023-04-14 DIAGNOSIS — C7989 Secondary malignant neoplasm of other specified sites: Secondary | ICD-10-CM | POA: Insufficient documentation

## 2023-04-14 DIAGNOSIS — Z90710 Acquired absence of both cervix and uterus: Secondary | ICD-10-CM | POA: Insufficient documentation

## 2023-04-14 DIAGNOSIS — Z79899 Other long term (current) drug therapy: Secondary | ICD-10-CM | POA: Insufficient documentation

## 2023-04-14 DIAGNOSIS — R300 Dysuria: Secondary | ICD-10-CM | POA: Insufficient documentation

## 2023-04-14 DIAGNOSIS — Z90722 Acquired absence of ovaries, bilateral: Secondary | ICD-10-CM | POA: Insufficient documentation

## 2023-04-14 DIAGNOSIS — Z8542 Personal history of malignant neoplasm of other parts of uterus: Secondary | ICD-10-CM | POA: Insufficient documentation

## 2023-04-14 DIAGNOSIS — Z7982 Long term (current) use of aspirin: Secondary | ICD-10-CM | POA: Insufficient documentation

## 2023-04-14 DIAGNOSIS — Z510 Encounter for antineoplastic radiation therapy: Secondary | ICD-10-CM | POA: Diagnosis not present

## 2023-04-14 DIAGNOSIS — C541 Malignant neoplasm of endometrium: Secondary | ICD-10-CM

## 2023-04-14 LAB — RAD ONC ARIA SESSION SUMMARY
Course Elapsed Days: 7
Plan Fractions Treated to Date: 6
Plan Prescribed Dose Per Fraction: 2 Gy
Plan Total Fractions Prescribed: 25
Plan Total Prescribed Dose: 50 Gy
Reference Point Dosage Given to Date: 12 Gy
Reference Point Session Dosage Given: 2 Gy
Session Number: 6

## 2023-04-14 LAB — CBC (CANCER CENTER ONLY)
HCT: 39.4 % (ref 36.0–46.0)
Hemoglobin: 12.8 g/dL (ref 12.0–15.0)
MCH: 29 pg (ref 26.0–34.0)
MCHC: 32.5 g/dL (ref 30.0–36.0)
MCV: 89.3 fL (ref 80.0–100.0)
Platelet Count: 449 10*3/uL — ABNORMAL HIGH (ref 150–400)
RBC: 4.41 MIL/uL (ref 3.87–5.11)
RDW: 14.2 % (ref 11.5–15.5)
WBC Count: 5.1 10*3/uL (ref 4.0–10.5)
nRBC: 0 % (ref 0.0–0.2)

## 2023-04-15 ENCOUNTER — Other Ambulatory Visit: Payer: Self-pay

## 2023-04-15 ENCOUNTER — Ambulatory Visit
Admission: RE | Admit: 2023-04-15 | Discharge: 2023-04-15 | Disposition: A | Discharge status: Home / Self Care | Payer: Medicare Other | Source: Ambulatory Visit | Attending: Radiation Oncology | Admitting: Radiation Oncology

## 2023-04-15 DIAGNOSIS — Z510 Encounter for antineoplastic radiation therapy: Secondary | ICD-10-CM | POA: Diagnosis not present

## 2023-04-15 LAB — RAD ONC ARIA SESSION SUMMARY
Course Elapsed Days: 8
Plan Fractions Treated to Date: 7
Plan Prescribed Dose Per Fraction: 2 Gy
Plan Total Fractions Prescribed: 25
Plan Total Prescribed Dose: 50 Gy
Reference Point Dosage Given to Date: 14 Gy
Reference Point Session Dosage Given: 2 Gy
Session Number: 7

## 2023-04-18 ENCOUNTER — Other Ambulatory Visit: Payer: Self-pay

## 2023-04-18 ENCOUNTER — Ambulatory Visit
Admission: RE | Admit: 2023-04-18 | Discharge: 2023-04-18 | Disposition: A | Discharge status: Home / Self Care | Payer: Medicare Other | Source: Ambulatory Visit | Attending: Radiation Oncology | Admitting: Radiation Oncology

## 2023-04-18 DIAGNOSIS — Z510 Encounter for antineoplastic radiation therapy: Secondary | ICD-10-CM | POA: Diagnosis not present

## 2023-04-18 LAB — RAD ONC ARIA SESSION SUMMARY
Course Elapsed Days: 11
Plan Fractions Treated to Date: 8
Plan Prescribed Dose Per Fraction: 2 Gy
Plan Total Fractions Prescribed: 25
Plan Total Prescribed Dose: 50 Gy
Reference Point Dosage Given to Date: 16 Gy
Reference Point Session Dosage Given: 2 Gy
Session Number: 8

## 2023-04-19 ENCOUNTER — Ambulatory Visit
Admission: RE | Admit: 2023-04-19 | Discharge: 2023-04-19 | Disposition: A | Discharge status: Home / Self Care | Payer: Medicare Other | Source: Ambulatory Visit | Attending: Radiation Oncology | Admitting: Radiation Oncology

## 2023-04-19 ENCOUNTER — Other Ambulatory Visit: Payer: Self-pay

## 2023-04-19 DIAGNOSIS — Z510 Encounter for antineoplastic radiation therapy: Secondary | ICD-10-CM | POA: Diagnosis not present

## 2023-04-19 LAB — RAD ONC ARIA SESSION SUMMARY
Course Elapsed Days: 12
Plan Fractions Treated to Date: 9
Plan Prescribed Dose Per Fraction: 2 Gy
Plan Total Fractions Prescribed: 25
Plan Total Prescribed Dose: 50 Gy
Reference Point Dosage Given to Date: 18 Gy
Reference Point Session Dosage Given: 2 Gy
Session Number: 9

## 2023-04-20 ENCOUNTER — Ambulatory Visit
Admission: RE | Admit: 2023-04-20 | Discharge: 2023-04-20 | Disposition: A | Discharge status: Home / Self Care | Payer: Medicare Other | Source: Ambulatory Visit | Attending: Radiation Oncology | Admitting: Radiation Oncology

## 2023-04-20 ENCOUNTER — Other Ambulatory Visit: Payer: Self-pay

## 2023-04-20 DIAGNOSIS — Z510 Encounter for antineoplastic radiation therapy: Secondary | ICD-10-CM | POA: Diagnosis not present

## 2023-04-20 LAB — RAD ONC ARIA SESSION SUMMARY
Course Elapsed Days: 13
Plan Fractions Treated to Date: 10
Plan Prescribed Dose Per Fraction: 2 Gy
Plan Total Fractions Prescribed: 25
Plan Total Prescribed Dose: 50 Gy
Reference Point Dosage Given to Date: 20 Gy
Reference Point Session Dosage Given: 2 Gy
Session Number: 10

## 2023-04-21 ENCOUNTER — Telehealth: Payer: Self-pay

## 2023-04-21 ENCOUNTER — Ambulatory Visit
Admission: RE | Admit: 2023-04-21 | Discharge: 2023-04-21 | Disposition: A | Discharge status: Home / Self Care | Payer: Medicare Other | Source: Ambulatory Visit | Attending: Radiation Oncology | Admitting: Radiation Oncology

## 2023-04-21 ENCOUNTER — Other Ambulatory Visit: Payer: Self-pay

## 2023-04-21 ENCOUNTER — Inpatient Hospital Stay (HOSPITAL_BASED_OUTPATIENT_CLINIC_OR_DEPARTMENT_OTHER): Payer: Medicare Other | Admitting: Nurse Practitioner

## 2023-04-21 ENCOUNTER — Inpatient Hospital Stay: Payer: Medicare Other

## 2023-04-21 ENCOUNTER — Encounter: Payer: Self-pay | Admitting: Nurse Practitioner

## 2023-04-21 VITALS — BP 140/79 | HR 59 | Temp 96.3°F | Wt 192.0 lb

## 2023-04-21 DIAGNOSIS — C541 Malignant neoplasm of endometrium: Secondary | ICD-10-CM

## 2023-04-21 DIAGNOSIS — R300 Dysuria: Secondary | ICD-10-CM

## 2023-04-21 DIAGNOSIS — Z510 Encounter for antineoplastic radiation therapy: Secondary | ICD-10-CM | POA: Diagnosis not present

## 2023-04-21 LAB — RAD ONC ARIA SESSION SUMMARY
Course Elapsed Days: 14
Plan Fractions Treated to Date: 11
Plan Prescribed Dose Per Fraction: 2 Gy
Plan Total Fractions Prescribed: 25
Plan Total Prescribed Dose: 50 Gy
Reference Point Dosage Given to Date: 22 Gy
Reference Point Session Dosage Given: 2 Gy
Session Number: 11

## 2023-04-21 LAB — URINALYSIS, COMPLETE (UACMP) WITH MICROSCOPIC
Bilirubin Urine: NEGATIVE
Glucose, UA: NEGATIVE mg/dL
Hgb urine dipstick: NEGATIVE
Ketones, ur: NEGATIVE mg/dL
Nitrite: NEGATIVE
Protein, ur: 30 mg/dL — AB
Specific Gravity, Urine: 1.014 (ref 1.005–1.030)
Squamous Epithelial / HPF: NONE SEEN /HPF (ref 0–5)
WBC, UA: >50 WBC/hpf (ref 0–5)
pH: 5 (ref 5.0–8.0)

## 2023-04-21 LAB — CBC (CANCER CENTER ONLY)
HCT: 40.1 % (ref 36.0–46.0)
Hemoglobin: 13 g/dL (ref 12.0–15.0)
MCH: 29 pg (ref 26.0–34.0)
MCHC: 32.4 g/dL (ref 30.0–36.0)
MCV: 89.3 fL (ref 80.0–100.0)
Platelet Count: 378 10*3/uL (ref 150–400)
RBC: 4.49 MIL/uL (ref 3.87–5.11)
RDW: 14.3 % (ref 11.5–15.5)
WBC Count: 4.9 10*3/uL (ref 4.0–10.5)
nRBC: 0 % (ref 0.0–0.2)

## 2023-04-21 MED ORDER — PHENAZOPYRIDINE HCL 200 MG PO TABS: 200.0000 mg | ORAL_TABLET | Freq: Three times a day (TID) | ORAL | 0 refills | Status: DC | PRN

## 2023-04-21 NOTE — Telephone Encounter (Signed)
Called and informed patient of UA results per Consuello Masse. Advised that UA resulted no concern for UTI at this time. We will wait for UC to result and determine if she needs to be treated for UTI. Advised to take medication sent to pharmacy for her comfort. Patient verbalized understanding.

## 2023-04-21 NOTE — Progress Notes (Signed)
[facility]  [facility] A Department of the [place]. [facility] [address] [place], [place] [address] [phone] (phone) [phone] (fax)  Patient Care Team: Lynnea Ferrier, MD as PCP - General (Internal Medicine)   Name of the patient: Morgan Dalton  [id number]  28-Jul-1951   Date of visit: 04/21/23  Diagnosis- recurrent endometrial cancer  Chief complaint/ Reason for visit- painful urination  Heme/Onc history:  Oncology History  Endometrial cancer (HCC)  03/20/2023 Initial Diagnosis   Endometrial cancer   03/20/2023 Cancer Staging   Staging form: Corpus Uteri - Carcinoma and Carcinosarcoma, AJCC 8th Edition - Clinical stage from 03/20/2023: FIGO Stage III (rcT3, cN0, cM0) - Signed by Creig Hines, MD on 03/20/2023 Stage prefix: Recurrence     Interval history- Patient is 72 year old female currently undergoing radiation for recurrent endometrial cancer who presents to [facility] for painful urination. Symptoms started in past day. No fevers or chills. No diarrhea. Denies other complaints.   Review of systems- Review of Systems  Constitutional:  Negative for chills, fever, malaise/fatigue and weight loss.  Gastrointestinal:  Negative for abdominal pain, constipation, diarrhea, nausea and vomiting.  Genitourinary:  Positive for dysuria. Negative for flank pain, frequency, hematuria and urgency.  Skin:  Negative for itching and rash.  Neurological:  Negative for weakness.      Allergies  Allergen Reactions   Statins Other (See Comments)    Myalgia    Past Medical History:  Diagnosis Date   Ankle fracture, left    Anxiety    Cancer (HCC)    Uterine Cancer   Dyspepsia    Endometrial cancer (HCC)    GERD (gastroesophageal reflux disease)    History of kidney stones    Hx of dysplastic nevus 10/16/2010   Right lower leg. Severe atypia. Excised: 11/25/2010. Margins free    Hypercholesterolemia    Hyperlipidemia    Hypertension    Hypothyroidism    Kidney stone    Osteopenia    Squamous cell carcinoma of skin 08/30/2019   Right temple. Well differentiated. Tx: [facility]   Past Surgical History:  Procedure Laterality Date   ABDOMINAL HYSTERECTOMY     CATARACT EXTRACTION W/PHACO Right 12/07/2016   Procedure: CATARACT EXTRACTION PHACO AND INTRAOCULAR LENS PLACEMENT (IOC);  Surgeon: Galen Manila, MD;  Location: [facility] ORS;  Service: Ophthalmology;  Laterality: Right;  [place] 42.5 AP% 23.2 CDE 9.87 Fluid pack lot # [id number]   CATARACT EXTRACTION W/PHACO Left 01/04/2017   Procedure: CATARACT EXTRACTION PHACO AND INTRAOCULAR LENS PLACEMENT (IOC);  Surgeon: Galen Manila, MD;  Location: [facility] ORS;  Service: Ophthalmology;  Laterality: Left;  [place] 39.2 AP% 23.9 CDE 9.38 Fluid Pack Lot # [id number]   CHOLECYSTECTOMY  2014   COLONOSCOPY  2014   COLONOSCOPY WITH ESOPHAGOGASTRODUODENOSCOPY (EGD)  2004   COLONOSCOPY WITH PROPOFOL N/A 11/07/2018   Procedure: COLONOSCOPY WITH PROPOFOL;  Surgeon: Christena Deem, MD;  Location: [facility] ENDOSCOPY;  Service: Endoscopy;  Laterality: N/A;   DILATION AND CURETTAGE OF UTERUS     EXTRACORPOREAL SHOCK WAVE LITHOTRIPSY     EYE SURGERY     GANGLION CYST EXCISION Right    wrist   HALLUX VALGUS LAPIDUS Left 08/11/2022   Procedure: HALLUX VALGUS LAPIDUS;  Surgeon: Gwyneth Revels, DPM;  Location: [facility] ORS;  Service: Podiatry;  Laterality: Left;   KIDNEY STONE SURGERY     several   KNEE ARTHROSCOPY Left 2014  partial medial meniscectomy, medial chondroplasty   NM PET TUMOR IMAGING  02/07/2023   TOTAL ABDOMINAL HYSTERECTOMY W/ BILATERAL SALPINGOOPHORECTOMY  2009   Social History   Socioeconomic History   Marital status: Married    Spouse name: Molly Maduro   Number of children: 2   Years of education: Not on file   Highest education level: Not on file  Occupational History   Not on file  Tobacco Use   Smoking status: Never    Smokeless tobacco: Never  Vaping Use   Vaping Use: Never used  Substance and Sexual Activity   Alcohol use: No   Drug use: No   Sexual activity: Not on file  Other Topics Concern   Not on file  Social History Narrative   Not on file   Social Determinants of Health   Financial Resource Strain: Not on file  Food Insecurity: No Food Insecurity (03/18/2023)   Hunger Vital Sign    Worried About Running Out of Food in the Last Year: Never true    Ran Out of Food in the Last Year: Never true  Transportation Needs: No Transportation Needs (03/18/2023)   PRAPARE - Administrator, Civil Service (Medical): No    Lack of Transportation (Non-Medical): No  Physical Activity: Not on file  Stress: Not on file  Social Connections: Not on file  Intimate Partner Violence: Not At Risk (03/18/2023)   Humiliation, Afraid, Rape, and Kick questionnaire    Fear of Current or Ex-Partner: No    Emotionally Abused: No    Physically Abused: No    Sexually Abused: No   Family History  Problem Relation Age of Onset   Breast cancer Paternal Aunt     Current Outpatient Medications:    aspirin EC 81 MG tablet, Take 81 mg by mouth daily. , Disp: , Rfl:    cholecalciferol (VITAMIN D3) 10 MCG (400 UNIT) TABS tablet, Take 1,000 Units by mouth daily., Disp: , Rfl:    Coenzyme Q10 (CO Q-10) 100 MG CAPS, Take 100 mg by mouth daily., Disp: , Rfl:    Cranberry 400 MG CAPS, Take 1 capsule by mouth daily., Disp: , Rfl:    ezetimibe (ZETIA) 10 MG tablet, Take 10 mg by mouth daily., Disp: , Rfl:    Glucosamine-Chondroitin 750-600 MG TABS, Take 1 tablet by mouth daily., Disp: , Rfl:    hydrochlorothiazide (HYDRODIURIL) 25 MG tablet, Take 12.5 mg by mouth daily., Disp: , Rfl:    Krill Oil 500 MG CAPS, Take 500 mg by mouth daily., Disp: , Rfl:    levothyroxine (SYNTHROID, LEVOTHROID) 25 MCG tablet, Take 25 mcg by mouth daily before breakfast., Disp: , Rfl:    losartan (COZAAR) 50 MG tablet, Take 1 tablet by mouth  daily., Disp: , Rfl:    Multiple Vitamins tablet, Take 1 tablet by mouth daily., Disp: , Rfl:    pantoprazole (PROTONIX) 40 MG tablet, Take 40 mg by mouth every other day., Disp: , Rfl:    rosuvastatin (CRESTOR) 5 MG tablet, Take 5 mg by mouth once a week. Thursday night, Disp: , Rfl:    sertraline (ZOLOFT) 50 MG tablet, Take 50 mg by mouth daily., Disp: , Rfl:   Physical exam:  Vitals:   04/21/23 1051  BP: (!) 140/79  Pulse: (!) 59  Temp: (!) 96.3 F (35.7 C)  TempSrc: Tympanic  SpO2: 100%  Weight: 192 lb (87.1 kg)   Physical Exam Vitals reviewed.  Constitutional:  Appearance: She is not ill-appearing.  Skin:    Coloration: Skin is not pale.  Neurological:     Mental Status: She is alert and oriented to person, place, and time.  Psychiatric:        Mood and Affect: Mood normal.        Behavior: Behavior normal.    Assessment and plan- Patient is a 72 y.o. female  Dysuria- likely secondary to radiation. Will check UA and culture to rule out infection. If negative, start pyridium 200 mg TID prn. UA concerning for infection, however, rare bacteria. Recommend waiting for culture before starting antibiotics given her mild symptoms. Can start pyridium for comfort.   Follow up if symptoms don't improve or worsen.     Visit Diagnosis 1. Dysuria     Patient expressed understanding and was in agreement with this plan. She also understands that She can call clinic at any time with any questions, concerns, or complaints.   Thank you for allowing me to participate in the care of this very pleasant patient.   Consuello Masse, DNP, AGNP-C, AOCNP [facility] [phone]

## 2023-04-22 ENCOUNTER — Ambulatory Visit
Admission: RE | Admit: 2023-04-22 | Discharge: 2023-04-22 | Disposition: A | Discharge status: Home / Self Care | Payer: Medicare Other | Source: Ambulatory Visit | Attending: Radiation Oncology | Admitting: Radiation Oncology

## 2023-04-22 ENCOUNTER — Other Ambulatory Visit: Payer: Self-pay

## 2023-04-22 DIAGNOSIS — Z510 Encounter for antineoplastic radiation therapy: Secondary | ICD-10-CM | POA: Diagnosis not present

## 2023-04-22 LAB — RAD ONC ARIA SESSION SUMMARY
Course Elapsed Days: 15
Plan Fractions Treated to Date: 12
Plan Prescribed Dose Per Fraction: 2 Gy
Plan Total Fractions Prescribed: 25
Plan Total Prescribed Dose: 50 Gy
Reference Point Dosage Given to Date: 24 Gy
Reference Point Session Dosage Given: 2 Gy
Session Number: 12

## 2023-04-24 LAB — URINE CULTURE: Culture: 40000 — AB

## 2023-04-25 ENCOUNTER — Ambulatory Visit
Admission: RE | Admit: 2023-04-25 | Discharge: 2023-04-25 | Disposition: A | Discharge status: Home / Self Care | Payer: Medicare Other | Source: Ambulatory Visit | Attending: Radiation Oncology | Admitting: Radiation Oncology

## 2023-04-25 ENCOUNTER — Other Ambulatory Visit: Payer: Self-pay

## 2023-04-25 DIAGNOSIS — Z510 Encounter for antineoplastic radiation therapy: Secondary | ICD-10-CM | POA: Diagnosis not present

## 2023-04-25 LAB — RAD ONC ARIA SESSION SUMMARY
Course Elapsed Days: 18
Plan Fractions Treated to Date: 13
Plan Prescribed Dose Per Fraction: 2 Gy
Plan Total Fractions Prescribed: 25
Plan Total Prescribed Dose: 50 Gy
Reference Point Dosage Given to Date: 26 Gy
Reference Point Session Dosage Given: 2 Gy
Session Number: 13

## 2023-04-26 ENCOUNTER — Other Ambulatory Visit: Payer: Self-pay

## 2023-04-26 ENCOUNTER — Telehealth: Payer: Self-pay | Admitting: *Deleted

## 2023-04-26 ENCOUNTER — Ambulatory Visit
Admission: RE | Admit: 2023-04-26 | Discharge: 2023-04-26 | Disposition: A | Discharge status: Home / Self Care | Payer: Medicare Other | Source: Ambulatory Visit | Attending: Radiation Oncology | Admitting: Radiation Oncology

## 2023-04-26 ENCOUNTER — Other Ambulatory Visit: Payer: Self-pay | Admitting: Hospice and Palliative Medicine

## 2023-04-26 DIAGNOSIS — Z510 Encounter for antineoplastic radiation therapy: Secondary | ICD-10-CM | POA: Diagnosis not present

## 2023-04-26 LAB — RAD ONC ARIA SESSION SUMMARY
Course Elapsed Days: 19
Plan Fractions Treated to Date: 14
Plan Prescribed Dose Per Fraction: 2 Gy
Plan Total Fractions Prescribed: 25
Plan Total Prescribed Dose: 50 Gy
Reference Point Dosage Given to Date: 28 Gy
Reference Point Session Dosage Given: 2 Gy
Session Number: 14

## 2023-04-26 MED ORDER — NITROFURANTOIN MONOHYD MACRO 100 MG PO CAPS: 100.0000 mg | ORAL_CAPSULE | Freq: Two times a day (BID) | ORAL | 0 refills | Status: DC

## 2023-04-26 MED ORDER — SULFAMETHOXAZOLE-TRIMETHOPRIM 800-160 MG PO TABS: 1.0000 | ORAL_TABLET | Freq: Two times a day (BID) | ORAL | 0 refills | Status: AC

## 2023-04-26 NOTE — Telephone Encounter (Signed)
-----   Message from Malachy Moan, NP sent at 04/26/2023  9:22 AM EDT ----- Please inform patient of urine culture results and that I sent a prescription for Macrobid twice daily x 5 days. ----- Message ----- From: Katharine Look, CMA Sent: 04/26/2023   8:37 AM EDT To: Malachy Moan, NP  Please advise  ----- Message ----- From: Leory Plowman, Lab [facility] [place] Sent: 04/21/2023  11:35 AM EDT To: Alinda Dooms, NP

## 2023-04-26 NOTE — Telephone Encounter (Signed)
I reached out to patient. Josh recommended macrobid. Patient stated that she could not tolerate macrobid in the past as this causes severe headaches and gi upset.  She would like to try  bactrim as this worked for her in the past. I spoke with josh. New script for bactrim ds  1 tablet twice daily x 5 days. Script sent to [facility] in [place]

## 2023-04-26 NOTE — Progress Notes (Signed)
Patient seen by Consuello Masse for dysuria.  Urine culture positive for E. faecalis.  Rx sent for Macrobid twice daily x 5 days.

## 2023-04-27 ENCOUNTER — Other Ambulatory Visit: Payer: Self-pay

## 2023-04-27 ENCOUNTER — Ambulatory Visit
Admission: RE | Admit: 2023-04-27 | Discharge: 2023-04-27 | Disposition: A | Discharge status: Home / Self Care | Payer: Medicare Other | Source: Ambulatory Visit | Attending: Radiation Oncology | Admitting: Radiation Oncology

## 2023-04-27 DIAGNOSIS — Z510 Encounter for antineoplastic radiation therapy: Secondary | ICD-10-CM | POA: Diagnosis not present

## 2023-04-27 LAB — RAD ONC ARIA SESSION SUMMARY
Course Elapsed Days: 20
Plan Fractions Treated to Date: 15
Plan Prescribed Dose Per Fraction: 2 Gy
Plan Total Fractions Prescribed: 25
Plan Total Prescribed Dose: 50 Gy
Reference Point Dosage Given to Date: 30 Gy
Reference Point Session Dosage Given: 2 Gy
Session Number: 15

## 2023-04-28 ENCOUNTER — Ambulatory Visit
Admission: RE | Admit: 2023-04-28 | Discharge: 2023-04-28 | Disposition: A | Discharge status: Home / Self Care | Payer: Medicare Other | Source: Ambulatory Visit | Attending: Radiation Oncology | Admitting: Radiation Oncology

## 2023-04-28 ENCOUNTER — Other Ambulatory Visit: Payer: Self-pay

## 2023-04-28 ENCOUNTER — Inpatient Hospital Stay: Payer: Medicare Other

## 2023-04-28 DIAGNOSIS — Z510 Encounter for antineoplastic radiation therapy: Secondary | ICD-10-CM | POA: Diagnosis not present

## 2023-04-28 DIAGNOSIS — C541 Malignant neoplasm of endometrium: Secondary | ICD-10-CM

## 2023-04-28 LAB — CBC (CANCER CENTER ONLY)
HCT: 38.6 % (ref 36.0–46.0)
Hemoglobin: 12.3 g/dL (ref 12.0–15.0)
MCH: 28.7 pg (ref 26.0–34.0)
MCHC: 31.9 g/dL (ref 30.0–36.0)
MCV: 90.2 fL (ref 80.0–100.0)
Platelet Count: 356 10*3/uL (ref 150–400)
RBC: 4.28 MIL/uL (ref 3.87–5.11)
RDW: 14.3 % (ref 11.5–15.5)
WBC Count: 5.1 10*3/uL (ref 4.0–10.5)
nRBC: 0 % (ref 0.0–0.2)

## 2023-04-28 LAB — RAD ONC ARIA SESSION SUMMARY
Course Elapsed Days: 21
Plan Fractions Treated to Date: 16
Plan Prescribed Dose Per Fraction: 2 Gy
Plan Total Fractions Prescribed: 25
Plan Total Prescribed Dose: 50 Gy
Reference Point Dosage Given to Date: 32 Gy
Reference Point Session Dosage Given: 2 Gy
Session Number: 16

## 2023-04-29 ENCOUNTER — Ambulatory Visit
Admission: RE | Admit: 2023-04-29 | Discharge: 2023-04-29 | Disposition: A | Discharge status: Home / Self Care | Payer: Medicare Other | Source: Ambulatory Visit | Attending: Radiation Oncology | Admitting: Radiation Oncology

## 2023-04-29 ENCOUNTER — Other Ambulatory Visit: Payer: Self-pay

## 2023-04-29 DIAGNOSIS — Z510 Encounter for antineoplastic radiation therapy: Secondary | ICD-10-CM | POA: Diagnosis not present

## 2023-04-29 LAB — RAD ONC ARIA SESSION SUMMARY
Course Elapsed Days: 22
Plan Fractions Treated to Date: 17
Plan Prescribed Dose Per Fraction: 2 Gy
Plan Total Fractions Prescribed: 25
Plan Total Prescribed Dose: 50 Gy
Reference Point Dosage Given to Date: 34 Gy
Reference Point Session Dosage Given: 2 Gy
Session Number: 17

## 2023-05-02 ENCOUNTER — Other Ambulatory Visit: Payer: Self-pay

## 2023-05-02 ENCOUNTER — Ambulatory Visit
Admission: RE | Admit: 2023-05-02 | Discharge: 2023-05-02 | Disposition: A | Discharge status: Home / Self Care | Payer: Medicare Other | Source: Ambulatory Visit | Attending: Radiation Oncology | Admitting: Radiation Oncology

## 2023-05-02 DIAGNOSIS — Z510 Encounter for antineoplastic radiation therapy: Secondary | ICD-10-CM | POA: Diagnosis not present

## 2023-05-02 LAB — RAD ONC ARIA SESSION SUMMARY
Course Elapsed Days: 25
Plan Fractions Treated to Date: 18
Plan Prescribed Dose Per Fraction: 2 Gy
Plan Total Fractions Prescribed: 25
Plan Total Prescribed Dose: 50 Gy
Reference Point Dosage Given to Date: 36 Gy
Reference Point Session Dosage Given: 2 Gy
Session Number: 18

## 2023-05-03 ENCOUNTER — Other Ambulatory Visit: Payer: Self-pay

## 2023-05-03 ENCOUNTER — Ambulatory Visit
Admission: RE | Admit: 2023-05-03 | Discharge: 2023-05-03 | Disposition: A | Discharge status: Home / Self Care | Payer: Medicare Other | Source: Ambulatory Visit | Attending: Radiation Oncology | Admitting: Radiation Oncology

## 2023-05-03 DIAGNOSIS — Z510 Encounter for antineoplastic radiation therapy: Secondary | ICD-10-CM | POA: Diagnosis not present

## 2023-05-03 LAB — RAD ONC ARIA SESSION SUMMARY
Course Elapsed Days: 26
Plan Fractions Treated to Date: 19
Plan Prescribed Dose Per Fraction: 2 Gy
Plan Total Fractions Prescribed: 25
Plan Total Prescribed Dose: 50 Gy
Reference Point Dosage Given to Date: 38 Gy
Reference Point Session Dosage Given: 2 Gy
Session Number: 19

## 2023-05-04 ENCOUNTER — Ambulatory Visit
Admission: RE | Admit: 2023-05-04 | Discharge: 2023-05-04 | Disposition: A | Discharge status: Home / Self Care | Payer: Medicare Other | Source: Ambulatory Visit | Attending: Radiation Oncology | Admitting: Radiation Oncology

## 2023-05-04 ENCOUNTER — Other Ambulatory Visit: Payer: Self-pay

## 2023-05-04 DIAGNOSIS — Z510 Encounter for antineoplastic radiation therapy: Secondary | ICD-10-CM | POA: Diagnosis not present

## 2023-05-04 LAB — RAD ONC ARIA SESSION SUMMARY
Course Elapsed Days: 27
Plan Fractions Treated to Date: 20
Plan Prescribed Dose Per Fraction: 2 Gy
Plan Total Fractions Prescribed: 25
Plan Total Prescribed Dose: 50 Gy
Reference Point Dosage Given to Date: 40 Gy
Reference Point Session Dosage Given: 2 Gy
Session Number: 20

## 2023-05-05 ENCOUNTER — Other Ambulatory Visit: Payer: Self-pay

## 2023-05-05 ENCOUNTER — Inpatient Hospital Stay: Payer: Medicare Other

## 2023-05-05 ENCOUNTER — Telehealth: Payer: Self-pay

## 2023-05-05 ENCOUNTER — Ambulatory Visit
Admission: RE | Admit: 2023-05-05 | Discharge: 2023-05-05 | Disposition: A | Discharge status: Home / Self Care | Payer: Medicare Other | Source: Ambulatory Visit | Attending: Radiation Oncology | Admitting: Radiation Oncology

## 2023-05-05 DIAGNOSIS — Z510 Encounter for antineoplastic radiation therapy: Secondary | ICD-10-CM | POA: Diagnosis not present

## 2023-05-05 DIAGNOSIS — C541 Malignant neoplasm of endometrium: Secondary | ICD-10-CM

## 2023-05-05 LAB — CBC (CANCER CENTER ONLY)
HCT: 38.6 % (ref 36.0–46.0)
Hemoglobin: 12.5 g/dL (ref 12.0–15.0)
MCH: 29.1 pg (ref 26.0–34.0)
MCHC: 32.4 g/dL (ref 30.0–36.0)
MCV: 89.8 fL (ref 80.0–100.0)
Platelet Count: 377 10*3/uL (ref 150–400)
RBC: 4.3 MIL/uL (ref 3.87–5.11)
RDW: 14.6 % (ref 11.5–15.5)
WBC Count: 5.1 10*3/uL (ref 4.0–10.5)
nRBC: 0 % (ref 0.0–0.2)

## 2023-05-05 LAB — RAD ONC ARIA SESSION SUMMARY
Course Elapsed Days: 28
Plan Fractions Treated to Date: 21
Plan Prescribed Dose Per Fraction: 2 Gy
Plan Total Fractions Prescribed: 25
Plan Total Prescribed Dose: 50 Gy
Reference Point Dosage Given to Date: 42 Gy
Reference Point Session Dosage Given: 2 Gy
Session Number: 21

## 2023-05-05 NOTE — Telephone Encounter (Signed)
Received a voicemail asking for a call from Consuello Masse NP with a couple of questions. No further details provided. [phone].

## 2023-05-06 ENCOUNTER — Ambulatory Visit
Admission: RE | Admit: 2023-05-06 | Discharge: 2023-05-06 | Disposition: A | Discharge status: Home / Self Care | Payer: Medicare Other | Source: Ambulatory Visit | Attending: Radiation Oncology | Admitting: Radiation Oncology

## 2023-05-06 ENCOUNTER — Encounter: Payer: Self-pay | Admitting: Nurse Practitioner

## 2023-05-06 ENCOUNTER — Other Ambulatory Visit: Payer: Self-pay

## 2023-05-06 ENCOUNTER — Other Ambulatory Visit: Payer: Self-pay | Admitting: Nurse Practitioner

## 2023-05-06 ENCOUNTER — Telehealth: Payer: Self-pay | Admitting: *Deleted

## 2023-05-06 DIAGNOSIS — Z510 Encounter for antineoplastic radiation therapy: Secondary | ICD-10-CM | POA: Diagnosis not present

## 2023-05-06 LAB — RAD ONC ARIA SESSION SUMMARY
Course Elapsed Days: 29
Plan Fractions Treated to Date: 22
Plan Prescribed Dose Per Fraction: 2 Gy
Plan Total Fractions Prescribed: 25
Plan Total Prescribed Dose: 50 Gy
Reference Point Dosage Given to Date: 44 Gy
Reference Point Session Dosage Given: 2 Gy
Session Number: 22

## 2023-05-06 NOTE — Telephone Encounter (Signed)
Patient called requesting to speak with Lauren due to being told that she would not have a CT done for at least 4 - 6 months after completing her radiation therapy, but she has been scheduled for 1 month completion of her radiation therapy and she wants to know why and what has changed.

## 2023-05-06 NOTE — Progress Notes (Signed)
Dr Johnnette Litter recommends waiting 4-6 weeks post radiation for ct. Will send message to get imaging updated and appts.

## 2023-05-10 ENCOUNTER — Ambulatory Visit
Admission: RE | Admit: 2023-05-10 | Discharge: 2023-05-10 | Disposition: A | Discharge status: Home / Self Care | Payer: Medicare Other | Source: Ambulatory Visit | Attending: Radiation Oncology | Admitting: Radiation Oncology

## 2023-05-10 ENCOUNTER — Other Ambulatory Visit: Payer: Self-pay

## 2023-05-10 DIAGNOSIS — Z510 Encounter for antineoplastic radiation therapy: Secondary | ICD-10-CM | POA: Diagnosis not present

## 2023-05-10 LAB — RAD ONC ARIA SESSION SUMMARY
Course Elapsed Days: 33
Plan Fractions Treated to Date: 23
Plan Prescribed Dose Per Fraction: 2 Gy
Plan Total Fractions Prescribed: 25
Plan Total Prescribed Dose: 50 Gy
Reference Point Dosage Given to Date: 46 Gy
Reference Point Session Dosage Given: 2 Gy
Session Number: 23

## 2023-05-11 ENCOUNTER — Other Ambulatory Visit: Payer: Self-pay

## 2023-05-11 ENCOUNTER — Ambulatory Visit
Admission: RE | Admit: 2023-05-11 | Discharge: 2023-05-11 | Disposition: A | Discharge status: Home / Self Care | Payer: Medicare Other | Source: Ambulatory Visit | Attending: Radiation Oncology | Admitting: Radiation Oncology

## 2023-05-11 DIAGNOSIS — Z510 Encounter for antineoplastic radiation therapy: Secondary | ICD-10-CM | POA: Diagnosis not present

## 2023-05-11 LAB — RAD ONC ARIA SESSION SUMMARY
Course Elapsed Days: 34
Plan Fractions Treated to Date: 24
Plan Prescribed Dose Per Fraction: 2 Gy
Plan Total Fractions Prescribed: 25
Plan Total Prescribed Dose: 50 Gy
Reference Point Dosage Given to Date: 48 Gy
Reference Point Session Dosage Given: 2 Gy
Session Number: 24

## 2023-05-12 ENCOUNTER — Ambulatory Visit
Admission: RE | Admit: 2023-05-12 | Discharge: 2023-05-12 | Disposition: A | Discharge status: Home / Self Care | Payer: Medicare Other | Source: Ambulatory Visit | Attending: Radiation Oncology | Admitting: Radiation Oncology

## 2023-05-12 ENCOUNTER — Inpatient Hospital Stay: Payer: Medicare Other

## 2023-05-12 ENCOUNTER — Other Ambulatory Visit: Payer: Self-pay

## 2023-05-12 ENCOUNTER — Encounter: Payer: Self-pay | Admitting: Dermatology

## 2023-05-12 DIAGNOSIS — Z510 Encounter for antineoplastic radiation therapy: Secondary | ICD-10-CM | POA: Diagnosis not present

## 2023-05-12 DIAGNOSIS — C541 Malignant neoplasm of endometrium: Secondary | ICD-10-CM

## 2023-05-12 LAB — RAD ONC ARIA SESSION SUMMARY
Course Elapsed Days: 35
Plan Fractions Treated to Date: 25
Plan Prescribed Dose Per Fraction: 2 Gy
Plan Total Fractions Prescribed: 25
Plan Total Prescribed Dose: 50 Gy
Reference Point Dosage Given to Date: 50 Gy
Reference Point Session Dosage Given: 2 Gy
Session Number: 25

## 2023-05-12 LAB — CBC (CANCER CENTER ONLY)
HCT: 39.4 % (ref 36.0–46.0)
Hemoglobin: 12.7 g/dL (ref 12.0–15.0)
MCH: 28.9 pg (ref 26.0–34.0)
MCHC: 32.2 g/dL (ref 30.0–36.0)
MCV: 89.7 fL (ref 80.0–100.0)
Platelet Count: 424 10*3/uL — ABNORMAL HIGH (ref 150–400)
RBC: 4.39 MIL/uL (ref 3.87–5.11)
RDW: 14.4 % (ref 11.5–15.5)
WBC Count: 4.7 10*3/uL (ref 4.0–10.5)
nRBC: 0 % (ref 0.0–0.2)

## 2023-05-23 ENCOUNTER — Ambulatory Visit: Payer: Medicare Other

## 2023-06-08 ENCOUNTER — Ambulatory Visit: Payer: Medicare Other

## 2023-06-13 ENCOUNTER — Encounter: Payer: Self-pay | Admitting: Radiation Oncology

## 2023-06-13 ENCOUNTER — Ambulatory Visit
Admission: RE | Admit: 2023-06-13 | Discharge: 2023-06-13 | Disposition: A | Discharge status: Home / Self Care | Payer: Medicare Other | Source: Ambulatory Visit | Attending: Radiation Oncology | Admitting: Radiation Oncology

## 2023-06-13 VITALS — BP 143/73 | HR 67 | Resp 20 | Wt 191.2 lb

## 2023-06-13 DIAGNOSIS — C541 Malignant neoplasm of endometrium: Secondary | ICD-10-CM | POA: Insufficient documentation

## 2023-06-13 NOTE — Progress Notes (Signed)
Radiation Oncology Follow up Note  Name: Morgan Dalton   Date:   06/13/2023 MRN:  [id number] DOB: 30-Jun-1951    This 72 y.o. female presents to the clinic today for 1 month follow-up status post pelvic radiation therapy and patient with recurrent endometrial cancer now about 15 years from vaginal brachytherapy.  REFERRING PROVIDER: Lynnea Ferrier, MD  HPI: Patient is a 72 year old female now out 1 month having completed IMRT radiation therapy to her pelvis.  She was treated with dose painting technique targeting the region of pelvic hypermetabolic activity to 55 Gray treating the rest of her pelvic lymph nodes to 45 [address] Cullens she is seen today in routine follow-up doing well specifically denies any increased lower urinary tract symptoms diarrhea or fatigue she is having no pelvic pain or discharge.  COMPLICATIONS OF TREATMENT: none  FOLLOW UP COMPLIANCE: keeps appointments   PHYSICAL EXAM:  BP (!) 143/73   Pulse 67   Resp 20   Wt 191 lb 3.2 oz (86.7 kg)   SpO2 100%   BMI 31.82 kg/m  Well-developed well-nourished patient in NAD. HEENT reveals PERLA, EOMI, discs not visualized.  Oral cavity is clear. No oral mucosal lesions are identified. Neck is clear without evidence of cervical or supraclavicular adenopathy. Lungs are clear to A&P. Cardiac examination is essentially unremarkable with regular rate and rhythm without murmur rub or thrill. Abdomen is benign with no organomegaly or masses noted. Motor sensory and DTR levels are equal and symmetric in the upper and lower extremities. Cranial nerves II through XII are grossly intact. Proprioception is intact. No peripheral adenopathy or edema is identified. No motor or sensory levels are noted. Crude visual fields are within normal range.  RADIOLOGY RESULTS: Patient scheduled for CT scan in 2 weeks of asked her to copy me on those results.  PLAN: Present time patient is doing well 1 month out from pelvic radiation with very low side effect  profile.  I am pleased with her overall progress she has an appointment in approximately 2 weeks with GYN oncology where she have a pelvic exam and we will be able to review her imaging at that time.  I have asked to see her back in 4 months for follow-up.  Patient is to call with any concerns.  I would like to take this opportunity to thank you for allowing me to participate in the care of your patient.Carmina Miller, MD

## 2023-06-24 ENCOUNTER — Ambulatory Visit
Admission: RE | Admit: 2023-06-24 | Discharge: 2023-06-24 | Disposition: A | Discharge status: Home / Self Care | Payer: Medicare Other | Source: Ambulatory Visit | Attending: Nurse Practitioner | Admitting: Nurse Practitioner

## 2023-06-24 DIAGNOSIS — C541 Malignant neoplasm of endometrium: Secondary | ICD-10-CM | POA: Diagnosis present

## 2023-06-24 DIAGNOSIS — C549 Malignant neoplasm of corpus uteri, unspecified: Secondary | ICD-10-CM | POA: Diagnosis present

## 2023-06-24 MED ORDER — IOHEXOL 300 MG/ML  SOLN
100.0000 mL | Freq: Once | INTRAMUSCULAR | Status: AC | PRN
  Administered 2023-06-24: 100 mL via INTRAVENOUS

## 2023-06-29 ENCOUNTER — Inpatient Hospital Stay: Payer: Medicare Other | Attending: Obstetrics and Gynecology | Admitting: Obstetrics and Gynecology

## 2023-06-29 VITALS — BP 128/74 | HR 75 | Temp 97.6°F | Resp 18 | Wt 192.3 lb

## 2023-06-29 DIAGNOSIS — C549 Malignant neoplasm of corpus uteri, unspecified: Secondary | ICD-10-CM

## 2023-06-29 DIAGNOSIS — Z90722 Acquired absence of ovaries, bilateral: Secondary | ICD-10-CM | POA: Diagnosis not present

## 2023-06-29 DIAGNOSIS — Z8542 Personal history of malignant neoplasm of other parts of uterus: Secondary | ICD-10-CM | POA: Diagnosis present

## 2023-06-29 DIAGNOSIS — Z9221 Personal history of antineoplastic chemotherapy: Secondary | ICD-10-CM | POA: Insufficient documentation

## 2023-06-29 DIAGNOSIS — Z90710 Acquired absence of both cervix and uterus: Secondary | ICD-10-CM | POA: Insufficient documentation

## 2023-06-29 DIAGNOSIS — C7989 Secondary malignant neoplasm of other specified sites: Secondary | ICD-10-CM | POA: Insufficient documentation

## 2023-06-29 DIAGNOSIS — Z923 Personal history of irradiation: Secondary | ICD-10-CM | POA: Insufficient documentation

## 2023-06-29 DIAGNOSIS — N952 Postmenopausal atrophic vaginitis: Secondary | ICD-10-CM | POA: Insufficient documentation

## 2023-06-29 NOTE — Progress Notes (Signed)
Gynecologic Oncology Interval Visit   Referring Provider: Dr. Feliberto Gottron  Chief Complaint: Right pelvic recurrence of grade 1 endometrial cancer Subjective:  LOUKISHA GUNNERSON is a 72 y.o. female who is seen in consultation from Dr. Graciela Husbands with history of endometrial cancer in 2009 who presents for discussion of imaging for incidental right pelvic mass.   She presents today for discussion of management options.  She started on 04/07/2023 and then completed radiation on 05/12/2023 total dose with 50 Gy.  06/24/2023 CT A/P Findings: The lesion centered just superior and lateral to the right side of the vaginal cuff measures 2.0 x 1.6 cm on 72/2. It is centrally hypoattenuating today (presumably secondary to necrosis). This makes differentiation from surrounding bowel loops challenging. Decreased in size from 3.2 x 2.2 cm on the prior CT.    Gynecologic History:  She underwent vaginal hysterectomy with Dr Feliberto Gottron at [facility] in 2009. 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 She was referred to Dr. Hyacinth Meeker at [facility] and underwent robotic assisted bilateral salpino-oophorectomy, bilateral pelvic lymph node dissection, supraumbilical laparotomy and periaortic lymph node dissection. Surgery complicated by adhesions. Final pathology revealed a deeply invasive (two thirds), well differentiated endometrioid adenocarcinoma, stage Ic. Washings cervix and adnexa were negative. She elected for adjuvant vaginal brachytherapy at [facility] and completed treatment 02/02/2008.   Patient went to ER for RUQ abdominal pain. CT 01/21/23 showed diverticulitis at hepatic flexure with some free air. Treated with antibiotics for diverticulitis.  Incidental finding of solid mass of right posterior adnexa measuring 3.2 x 2.2 cm, new since previous CT scan in 2020. She also has a large staghorn calculus in the right kidney.  She was seen by Dr. Feliberto Gottron in [place].   Patient provided [place] with her  records from her endometrial cancer which were reviewed in detail, copied, and scanned into her chart.  Last colonoscopy 11/07/2018- report reviewed and showed tortuous colon, diverticulosis, recommendation to repeat in 5 years (2024)  01/30/23- MRI Abdomen and Pelvis w wo contrast S/p hysterectomy. Left ovary not visualized. Solid heterogeneously enhancing right ovarian lesion measuring 3.0 x 1.9 x 2.3 cm. No ascites. No lymphadenopathy. Inflammation of proximal transverse colon consistent with residual diverticulitis.  MRI was addended to reflect that mass was present on CT from 2020 and measured 1.6 x 1.2 cm at that time. No definite communication with the colon or small bowel loops and the appendix is identified as separate from this lesion.   HE4- 142 (elevated) CA 125 - 18 (normal) ROMA- 30.57% (high risk)  01/25/23- Pap- NILM  A PET scan was performed to better ascertain the etiology of the mass. 02/10/23- PET 1. Hypermetabolic solid mass in the RIGHT pelvis consistent with ovarian neoplasm. 2. No evidence of metastatic adenopathy in the pelvis or periaortic retroperitoneum. 3. No evidence of metastatic disease outside the pelvis. 4. Post hysterectomy anatomy. No abnormal metabolic activity associated with the vagina. 5. RIGHT renal staghorn calculus.  Imaging was reviewed by Dr. Amil Amen. Report was addended to reflect that based on her prior surgical hysterectomy and oophorectomy, intensely hypermetabolic solid mass is favored to be a neoplastic deposit, potentially from remote endometrial carcinoma.   03/04/23 Image guided biopsy of right pelvic mass. DIAGNOSIS: A. SOFT TISSUE, RIGHT ADNEXAL; CT-GUIDED CORE NEEDLE BIOPSY: - POSITIVE FOR MALIGNANCY. - METASTATIC ADENOCARCINOMA, COMPATIBLE WITH ENDOMETRIOID ADENOCARCINOMA.  Comment: The patient's prior history of endometrioid adenocarcinoma in 2009 is noted.  Biopsy sections display an abnormal cribriform and somewhat papillary  glandular proliferation, histologically compatible  with the patient's previously diagnosed endometrioid adenocarcinoma. Immunohistochemical studies show tumor cells to be positive for pancytokeratin, PAX8, estrogen receptor (greater than 90%, strong), and progesterone receptor (greater than 90%, strong).  Tumor cells are negative for CK7, WT1, CDX2, and Napsin A.  P53 demonstrates a wild-type (non-mutated) pattern of expression.  Taken together, the morphologic and immunophenotypic findings support the above diagnosis.  Tumor cells are positive for estrogen receptor (greater than 90%,  strong) and progesterone receptor (greater than 90%, strong).  Tumor  cells display wild-type (9 mutated) expression of p53.    DNA Mismatch Repair (MMR)  MLH1: Intact nuclear expression  MSH2: Intact nuclear expression  MSH6: Intact nuclear expression  PMS2: Intact nuclear expression     Problem List: Patient Active Problem List   Diagnosis Date Noted   Endometrial cancer (HCC) 03/20/2023   Benign essential hypertension 01/27/2023   Diverticulitis 01/26/2023   Thrombocytosis 11/29/2019   GERD (gastroesophageal reflux disease) 12/31/2016   History of nephrolithiasis 12/31/2016   Hyperlipidemia, unspecified 12/31/2016   Insomnia 12/31/2016   Osteopenia 12/31/2016   Acquired hypothyroidism 10/26/2016   Bladder neoplasm of uncertain malignant potential 09/09/2016   Bladder pain 08/11/2016   Chronic cystitis 08/11/2016   Mild episode of recurrent major depressive disorder (HCC) 07/23/2016   History of endometrial cancer 01/23/2016   Renal colic 06/19/2014   Right upper quadrant pain 08/07/2012   Kidney stone 08/07/2012   Gross hematuria 08/07/2012   Malignant neoplasm of corpus uteri (HCC) 08/07/2012   Microscopic hematuria 08/07/2012   Urinary tract infection 08/07/2012    Past Medical History: Past Medical History:  Diagnosis Date   Ankle fracture, left    Anxiety    Cancer (HCC)     Uterine Cancer   Dyspepsia    Endometrial cancer (HCC)    GERD (gastroesophageal reflux disease)    History of kidney stones    Hx of dysplastic nevus 10/16/2010   Right lower leg. Severe atypia. Excised: 11/25/2010. 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 Well differentiated. Tx: [facility]    Past Surgical History: Past Surgical History:  Procedure Laterality Date   ABDOMINAL HYSTERECTOMY     CATARACT EXTRACTION W/PHACO Right 12/07/2016   Procedure: CATARACT EXTRACTION PHACO AND INTRAOCULAR LENS PLACEMENT (IOC);  Surgeon: Galen Manila, MD;  Location: [facility] ORS;  Service: Ophthalmology;  Laterality: Right;  [place] 42.5 AP% 23.2 CDE 9.87 Fluid pack lot # [id number]   CATARACT EXTRACTION W/PHACO Left 01/04/2017   Procedure: CATARACT EXTRACTION PHACO AND INTRAOCULAR LENS PLACEMENT (IOC);  Surgeon: Galen Manila, MD;  Location: [facility] ORS;  Service: Ophthalmology;  Laterality: Left;  [place] 39.2 AP% 23.9 CDE 9.38 Fluid Pack Lot # [id number]   CHOLECYSTECTOMY  2014   COLONOSCOPY  2014   COLONOSCOPY WITH ESOPHAGOGASTRODUODENOSCOPY (EGD)  2004   COLONOSCOPY WITH PROPOFOL N/A 11/07/2018   Procedure: COLONOSCOPY WITH PROPOFOL;  Surgeon: Christena Deem, MD;  Location: [facility] ENDOSCOPY;  Service: Endoscopy;  Laterality: N/A;   DILATION AND CURETTAGE OF UTERUS     EXTRACORPOREAL SHOCK WAVE LITHOTRIPSY     EYE SURGERY     GANGLION CYST EXCISION Right    wrist   HALLUX VALGUS LAPIDUS Left 08/11/2022   Procedure: HALLUX VALGUS LAPIDUS;  Surgeon: Gwyneth Revels, DPM;  Location: [facility] ORS;  Service: Podiatry;  Laterality: Left;  KIDNEY STONE SURGERY     several   KNEE ARTHROSCOPY Left 2014   partial medial meniscectomy, medial chondroplasty   NM PET TUMOR IMAGING  02/07/2023   TOTAL ABDOMINAL HYSTERECTOMY W/ BILATERAL SALPINGOOPHORECTOMY  2009    Past  Gynecologic History:  G2P2 Post menopausal Menarche: age 32  OB History:  OB History  No obstetric history on file.    Family History: Family History  Problem Relation Age of Onset   Breast cancer Paternal Aunt     Social History: Social History   Socioeconomic History   Marital status: Married    Spouse name: Molly Maduro   Number of children: 2   Years of education: Not on file   Highest education level: Not on file  Occupational History   Not on file  Tobacco Use   Smoking status: Never   Smokeless tobacco: Never  Vaping Use   Vaping status: Never Used  Substance and Sexual Activity   Alcohol use: No   Drug use: No   Sexual activity: Not on file  Other Topics Concern   Not on file  Social History Narrative   Not on file   Social Determinants of Health   Financial Resource Strain: Low Risk  (05/11/2023)   [facility]   Overall Financial Resource Strain (CARDIA)    Difficulty of Paying Living Expenses: Not hard at all  Food Insecurity: No Food Insecurity (05/11/2023)   Received from [facility], [facility]   Hunger Vital Sign    Worried About Running Out of Food in the Last Year: Never true    Ran Out of Food in the Last Year: Never true  Transportation Needs: No Transportation Needs (05/11/2023)   Received from [facility], [facility]   [facility] - Transportation    In the past 12 months, has lack of transportation kept you from medical appointments or from getting medications?: No    Lack of Transportation (Non-Medical): No  Physical Activity: Not on file  Stress: Not on file  Social Connections: Not on file  Intimate Partner Violence: Not At Risk (03/18/2023)   Humiliation, Afraid, Rape, and Kick questionnaire    Fear of Current or Ex-Partner: No    Emotionally Abused: No    Physically Abused: No    Sexually Abused: No     Allergies: Allergies  Allergen Reactions   Statins Other (See Comments)    Myalgia     Current Medications: Current Outpatient Medications  Medication Sig Dispense Refill   aspirin EC 81 MG tablet Take 81 mg by mouth daily.      cholecalciferol (VITAMIN D3) 10 MCG (400 UNIT) TABS tablet Take 1,000 Units by mouth daily.     Coenzyme Q10 (CO Q-10) 100 MG CAPS Take 100 mg by mouth daily.     Cranberry 400 MG CAPS Take 1 capsule by mouth daily.     ezetimibe (ZETIA) 10 MG tablet Take 10 mg by mouth daily.     Glucosamine-Chondroitin 750-600 MG TABS Take 1 tablet by mouth daily.     hydrochlorothiazide (HYDRODIURIL) 25 MG tablet Take 12.5 mg by mouth daily.     Krill Oil 500 MG CAPS Take 500 mg by mouth daily.     levothyroxine (SYNTHROID, LEVOTHROID) 25 MCG tablet Take 25 mcg by mouth daily before breakfast.     losartan (COZAAR) 50 MG tablet Take 1 tablet by mouth daily.  Multiple Vitamins tablet Take 1 tablet by mouth daily.     nitrofurantoin, macrocrystal-monohydrate, (MACROBID) 100 MG capsule Take 1 capsule (100 mg total) by mouth 2 (two) times daily. 10 capsule 0   pantoprazole (PROTONIX) 40 MG tablet Take 40 mg by mouth every other day.     phenazopyridine (PYRIDIUM) 200 MG tablet Take 1 tablet (200 mg total) by mouth 3 (three) times daily as needed (urinary pain). 90 tablet 0   rosuvastatin (CRESTOR) 5 MG tablet Take 5 mg by mouth once a week. Thursday night     sertraline (ZOLOFT) 50 MG tablet Take 50 mg by mouth daily.     No current facility-administered medications for this visit.   Review of Systems General: no complaints  HEENT: no complaints  Lungs: no complaints  Cardiac: no complaints  GI: Diarrhea secondary to radiation otherwise no complaints  GU: no complaints GYN: Vaginal discharge now improved.  No vaginal spotting.  Pelvic pain during radiation now resolved  Musculoskeletal: no complaints  Extremities: no complaints  Skin: no complaints  Neuro: no  complaints  Endocrine: no complaints  Psych: no complaints      Objective:  Physical Examination:  BP 128/74   Pulse 75   Temp 97.6 F (36.4 C)   Resp 18   Wt 192 lb 4.8 oz (87.2 kg)   SpO2 98%   BMI 32.00 kg/m     ECOG Performance Status: 0 - Asymptomatic  GENERAL: Patient is a well appearing female in no acute distress HEENT:  PERRL, atraumatic normocephalic NODES:  No cervical, supraclavicular, axillary, or inguinal lymphadenopathy palpated.  LUNGS: Respiratory effort normal ABDOMEN:  Soft, nontender.  Nondistended.  No evidence of masses ascites or hernias. MSK:  No focal spinal tenderness to palpation.  EXTREMITIES:  No peripheral edema.   SKIN: Incisions close dry and intact well-healed NEURO:  Nonfocal. Well oriented.  Appropriate affect.  Pelvic: EGBUS: no lesions Cervix: Surgically absent Vagina: no lesions, no discharge or bleeding Uterus: Surgically absent BME: no palpable masses Rectovaginal: confirmatory   Lab Review Labs on site today: No labs on site today  Radiologic Imaging: 06/28/2023 CT ABDOMEN AND PELVIS WITH CONTRAST   TECHNIQUE: Multidetector CT imaging of the abdomen and pelvis was performed using the standard protocol following bolus administration of intravenous contrast.   RADIATION DOSE REDUCTION: This exam was performed according to the departmental dose-optimization program which includes automated exposure control, adjustment of the mA and/or kV according to patient size and/or use of iterative reconstruction technique.   CONTRAST:  OMNIPAQUE IOHEXOL 300 MG/ML  SOLN   COMPARISON:  02/10/2023 PET.  Abdominopelvic MRI of 01/30/2023.   FINDINGS: Lower chest: Left lower lobe calcified granuloma. Borderline cardiomegaly.   Hepatobiliary: Suspect mild hepatic steatosis. Mild hepatomegaly at 18.7 cm craniocaudal. Cholecystectomy, without biliary ductal dilatation.   Pancreas: Normal, without mass or ductal dilatation.    Spleen: Normal in size, without focal abnormality.   Adrenals/Urinary Tract: Normal adrenal glands. Normal left kidney for age. Branching, staghorn type calculus again identified within the lower pole right renal collecting system and extending into the right renal pelvis, similar. No significant obstruction. Normal urinary bladder.   Stomach/Bowel: Normal stomach, without wall thickening. Scattered colonic diverticula. Colonic stool burden suggests constipation. Normal terminal ileum. Normal small bowel.   Vascular/Lymphatic: Aortic atherosclerosis. No abdominopelvic adenopathy.   Reproductive: Hysterectomy. The lesion centered just superior and lateral to the right side of the vaginal cuff measures 2.0 x 1.6 cm on 72/2. It  is centrally hypoattenuating today (presumably secondary to necrosis). This makes differentiation from surrounding bowel loops challenging. Decreased in size from 3.2 x 2.2 cm on the prior CT.   No new adnexal mass.   Other: No significant free fluid. No evidence of omental or peritoneal disease.   Musculoskeletal: Osteopenia. Degenerative disc disease at the lumbosacral junction.   IMPRESSION: 1. Response to therapy of right pelvic mass, as evidenced by decrease in size and development of central necrosis. 2. No new or progressive disease. 3. Similar right lower pole staghorn type renal calculus. 4. Hepatomegaly and possible hepatic steatosis. 5.  Aortic Atherosclerosis (ICD10-I70.0).    Assessment:  SPECIAL RANES is a 72 y.o. female diagnosed with grade 1 (p53wt, ER+, pMMR) deeply invasive endometrioid endometrial cancer in 2009, isolated vaginal recurrence status post radiation therapy with excellent response based on exam and imaging.   Vaginal exam is notable for severe radiation changes and atrophy.   Medical co-morbidities complicating care: diverticulitis, hypothyroid.  Plan:   Problem List Items Addressed This Visit       Genitourinary    Malignant neoplasm of corpus uteri [facility]) - Primary   Other Visit Diagnoses     Vaginal atrophy          We reviewed NCCN guidelines regarding management for local recurrence.  The recommendations are for radiation therapy with or without brachytherapy with or without systemic therapy. We reviewed systemic therapy options including hormonal therapy and chemotherapy.  Given that she has high hormone receptor status I recommended either Megace or letrozole.  We discussed goals of care and the risk-benefit ratio of different therapy options. I provided information regarding both these medications and will have a telephone visit in approximately 3 weeks to review the risk benefits for each of these treatments.   We will see her back with CT scan A/P in 3 months.   HE4 elevation likely related to inflammation or kidney disease.  She asked about using blood based tumor markers to follow her disease and I did not recommend doing that given that her a HE4 elevation may not be related to her malignancy.    The patient's diagnosis, an outline of the further diagnostic and laboratory studies which will be required, the recommendation for surgery, and alternatives were discussed with her and her accompanying family members.  All questions were answered to their satisfaction.   [place] Labrosse Leta Jungling, MD

## 2023-06-29 NOTE — Patient Instructions (Signed)
Megestrol Tablets What is this medication? MEGESTROL (me JES trol) reduces the symptoms of breast or endometrial cancer. It works by decreasing levels of estrogen and other hormones in your body, which may slow or stop cancer cells from spreading or growing. This medication is a progestin hormone. This medicine may be used for other purposes; ask your health care provider or pharmacist if you have questions. COMMON BRAND NAME(S): Megace What should I tell my care team before I take this medication? They need to know if you have any of these conditions: Adrenal gland problems Diabetes History of blood clots of the legs, lungs, or other parts of the body Kidney disease Liver disease Stroke An unusual or allergic reaction to megestrol, other medications, foods, dyes, or preservatives Pregnant or trying to get pregnant Breast-feeding How should I use this medication? Take this medication by mouth. Follow the directions on the prescription label. Do not take your medication more often than directed. Take your doses at regular intervals. Do not stop taking except on the advice of your care team. Talk to your care team about the use of this medication in children. Special care may be needed. Overdosage: If you think you have taken too much of this medicine contact a poison control center or emergency room at once. NOTE: This medicine is only for you. Do not share this medicine with others. What if I miss a dose? If you miss a dose, take it as soon as you can. If it is almost time for your next dose, take only that dose. Do not take double or extra doses. What may interact with this medication? Do not take this medication with any of the following: Dofetilide This medication may also interact with the following: Indinavir This list may not describe all possible interactions. Give your health care provider a list of all the medicines, herbs, non-prescription drugs, or dietary supplements you use.  Also tell them if you smoke, drink alcohol, or use illegal drugs. Some items may interact with your medicine. What should I watch for while using this medication? Visit your care team for regular checks on your progress. Continue taking this medication even if you feel better. It may take 2 months of regular use before you know if this medication is working for your condition. Talk to your care team if you wish to become pregnant or think you might be pregnant. This medication can cause serious birth defects if taken during pregnancy. A negative pregnancy test is required before starting this medication. Effective contraception is recommended during treatment. If you have diabetes, this medication may affect blood sugar levels. Check your blood sugar and talk to your care team if you notice changes. What side effects may I notice from receiving this medication? Side effects that you should report to your care team as soon as possible: Allergic reactions--skin rash, itching, hives, swelling of the face, lips, tongue, or throat Blood clot--pain, swelling, or warmth in the leg, shortness of breath, chest pain Cushing syndrome--increased fat around the midsection, upper back, neck, or face, pink or purple stretch marks on the skin, thinning, fragile skin that easily bruises, unexpected hair growth Heart failure--shortness of breath, swelling of the ankles, feet, or hands, sudden weight gain, unusual weakness or fatigue High blood sugar (hyperglycemia)--increased thirst or amount of urine, unusual weakness or fatigue, blurry vision Increase in blood pressure Low adrenal gland function--nausea, vomiting, loss of appetite, unusual weakness or fatigue, dizziness Side effects that usually do not require medical attention (  report to your care team if they continue or are bothersome): Hair loss Hot flashes Irregular menstrual cycles or spotting Nausea Shortness of breath Weight gain This list may not  describe all possible side effects. Call your doctor for medical advice about side effects. You may report side effects to FDA at 1-800-FDA-1088. Where should I keep my medication? Keep out of the reach of children and pets. Store at controlled room temperature between 15 and 30 degrees C (59 and 86 degrees F). Protect from heat above 40 degrees C (104 degrees F). Throw away any unused medication after the expiration date. NOTE: This sheet is a summary. It may not cover all possible information.   Letrozole Tablets What is this medication? LETROZOLE (LET roe zole) treats some types of breast cancer. It works by decreasing the amount of estrogen hormone your body makes, which slows or stops breast cancer cells from spreading or growing. This medicine may be used for other purposes; ask your health care provider or pharmacist if you have questions. COMMON BRAND NAME(S): Femara What should I tell my care team before I take this medication? They need to know if you have any of these conditions: High cholesterol Liver disease Osteoporosis (weak bones) An unusual or allergic reaction to letrozole, other medications, foods, dyes, or preservatives Pregnant or trying to get pregnant Breast-feeding How should I use this medication? Take this medication by mouth with a glass of water. You may take it with or without food. Follow the directions on the prescription label. Take your medication at regular intervals. Do not take your medication more often than directed. Do not stop taking except on your care team's advice. Talk to your care team about the use of this medication in children. Special care may be needed. Overdosage: If you think you have taken too much of this medicine contact a poison control center or emergency room at once. NOTE: This medicine is only for you. Do not share this medicine with others. What if I miss a dose? If you miss a dose, take it as soon as you can. If it is almost time  for your next dose, take only that dose. Do not take double or extra doses. What may interact with this medication? Do not take this medication with any of the following: Estrogens, like hormone replacement therapy or birth control pills This medication may also interact with the following: Dietary supplements such as androstenedione or DHEA Prasterone Tamoxifen This list may not describe all possible interactions. Give your health care provider a list of all the medicines, herbs, non-prescription drugs, or dietary supplements you use. Also tell them if you smoke, drink alcohol, or use illegal drugs. Some items may interact with your medicine. What should I watch for while using this medication? Tell your care team if your symptoms do not start to get better or if they get worse. Do not become pregnant while taking this medication or for 3 weeks after stopping it. Women should inform their care team if they wish to become pregnant or think they might be pregnant. There is a potential for serious side effects to an unborn child. Talk to your care team or pharmacist for more information. Do not breast-feed while taking this medication or for 3 weeks after stopping it. This medication may interfere with the ability to have a child. Talk with your care team if you are concerned about your fertility. Using this medication for a long time may increase your risk of low  bone mass. Talk to your care team about bone health. You may get drowsy or dizzy. Do not drive, use machinery, or do anything that needs mental alertness until you know how this medication affects you. Do not stand or sit up quickly, especially if you are an older patient. This reduces the risk of dizzy or fainting spells. You may need blood work done while you are taking this medication. What side effects may I notice from receiving this medication? Side effects that you should report to your care team as soon as possible: Allergic  reactions--skin rash, itching, hives, swelling of the face, lips, tongue, or throat Side effects that usually do not require medical attention (report to your care team if they continue or are bothersome): Dizziness Fatigue Headache Hot flashes Joint Pain Swelling of the ankles, hands, or feet This list may not describe all possible side effects. Call your doctor for medical advice about side effects. You may report side effects to FDA at 1-800-FDA-1088. Where should I keep my medication? Keep out of the reach of children. Store between 15 and 30 degrees C (59 and 86 degrees F). Throw away any unused medication after the expiration date. NOTE: This sheet is a summary. It may not cover all possible information. If you have questions about this medicine, talk to your doctor, pharmacist, or health care provider.  2024 Elsevier/Gold Standard (2021-07-15 00:00:00)

## 2023-06-30 ENCOUNTER — Other Ambulatory Visit: Payer: Self-pay

## 2023-06-30 DIAGNOSIS — N200 Calculus of kidney: Secondary | ICD-10-CM

## 2023-07-06 ENCOUNTER — Ambulatory Visit (INDEPENDENT_AMBULATORY_CARE_PROVIDER_SITE_OTHER): Payer: Medicare Other | Admitting: Dermatology

## 2023-07-06 VITALS — BP 128/72

## 2023-07-06 DIAGNOSIS — L820 Inflamed seborrheic keratosis: Secondary | ICD-10-CM | POA: Diagnosis not present

## 2023-07-06 DIAGNOSIS — W908XXA Exposure to other nonionizing radiation, initial encounter: Secondary | ICD-10-CM

## 2023-07-06 DIAGNOSIS — D1801 Hemangioma of skin and subcutaneous tissue: Secondary | ICD-10-CM

## 2023-07-06 DIAGNOSIS — L578 Other skin changes due to chronic exposure to nonionizing radiation: Secondary | ICD-10-CM | POA: Diagnosis not present

## 2023-07-06 DIAGNOSIS — L821 Other seborrheic keratosis: Secondary | ICD-10-CM

## 2023-07-06 NOTE — Progress Notes (Signed)
   Follow-Up Visit   Subjective  SAVANHA ISLAND is a 72 y.o. female who presents for the following: check spot L inframammary >49m, burns, check spots L flank  The patient has spots, moles and lesions to be evaluated, some may be new or changing and the patient may have concern these could be cancer.  The following portions of the chart were reviewed this encounter and updated as appropriate: medications, allergies, medical history  Review of Systems:  No other skin or systemic complaints except as noted in HPI or Assessment and Plan.  Objective  Well appearing patient in no apparent distress; mood and affect are within normal limits.   A focused examination was performed of the following areas: trunk  Relevant exam findings are noted in the Assessment and Plan.  L flank x 2, L inframammary x 1 (3) Stuck on waxy paps with erythema    Assessment & Plan   HEMANGIOMA Exam: red papule(s) L flank  Treatment: Discussed benign nature. Recommend observation. Call for changes.  Discussed BBL Counseling for BBL / IPL / Laser and Coordination of Care Discussed the treatment option of Broad Band Light (BBL) /Intense Pulsed Light (IPL)/ Laser for skin discoloration, including brown spots and redness.  Typically we recommend at least 1-3 treatment sessions about 5-8 weeks apart for best results.  Cannot have tanned skin when BBL performed, and regular use of sunscreen is advised after the procedure to help maintain results. The patient's condition may also require "maintenance treatments" in the future.  The fee for BBL / laser treatments is $350 per treatment session for the whole face.  A fee can be quoted for other parts of the body.  Insurance typically does not pay for BBL/laser treatments and therefore the fee is an out-of-pocket cost.   SEBORRHEIC KERATOSIS - Stuck-on, waxy, tan-brown papules and/or plaques  - Benign-appearing - Discussed benign etiology and prognosis. - Observe -  Call for any changes  ACTINIC DAMAGE - chronic, secondary to cumulative UV radiation exposure/sun exposure over time - diffuse scaly erythematous macules with underlying dyspigmentation - Recommend daily broad spectrum sunscreen SPF 30+ to sun-exposed areas, reapply every 2 hours as needed.  - Recommend staying in the shade or wearing long sleeves, sun glasses (UVA+UVB protection) and wide brim hats (4-inch brim around the entire circumference of the hat). - Call for new or changing lesions.   Inflamed seborrheic keratosis (3) L flank x 2, L inframammary x 1  Symptomatic, irritating, patient would like treated.   Destruction of lesion - L flank x 2, L inframammary x 1 (3) Complexity: simple   Destruction method: cryotherapy   Informed consent: discussed and consent obtained   Timeout:  patient name, date of birth, surgical site, and procedure verified Lesion destroyed using liquid nitrogen: Yes   Region frozen until ice ball extended beyond lesion: Yes   Outcome: patient tolerated procedure well with no complications   Post-procedure details: wound care instructions given      Return for as scheduled for [facility].  I, Ardis Rowan, RMA, am acting as scribe for Armida Sans, MD .   Documentation: I have reviewed the above documentation for accuracy and completeness, and I agree with the above.  Armida Sans, MD

## 2023-07-06 NOTE — Patient Instructions (Addendum)
Cryotherapy Aftercare  Wash gently with soap and water everyday.   Apply Vaseline and Band-Aid daily until healed.     Due to recent changes in healthcare laws, you may see results of your pathology and/or laboratory studies on MyChart before the doctors have had a chance to review them. We understand that in some cases there may be results that are confusing or concerning to you. Please understand that not all results are received at the same time and often the doctors may need to interpret multiple results in order to provide you with the best plan of care or course of treatment. Therefore, we ask that you please give us 2 business days to thoroughly review all your results before contacting the office for clarification. Should we see a critical lab result, you will be contacted sooner.   If You Need Anything After Your Visit  If you have any questions or concerns for your doctor, please call our main line at [phone] and press option 4 to reach your doctor's medical assistant. If no one answers, please leave a voicemail as directed and we will return your call as soon as possible. Messages left after 4 pm will be answered the following business day.   You may also send us a message via MyChart. We typically respond to MyChart messages within 1-2 business days.  For prescription refills, please ask your pharmacy to contact our office. Our fax number is [phone].  If you have an urgent issue when the clinic is closed that cannot wait until the next business day, you can page your doctor at the number below.    Please note that while we do our best to be available for urgent issues outside of office hours, we are not available 24/7.   If you have an urgent issue and are unable to reach us, you may choose to seek medical care at your doctor's office, retail clinic, urgent care center, or emergency room.  If you have a medical emergency, please immediately call 911 or go to the  emergency department.  Pager Numbers  - Dr. Kowalski: [phone]  - Dr. Moye: [phone]  - Dr. Stewart: [phone]  In the event of inclement weather, please call our main line at [phone] for an update on the status of any delays or closures.  Dermatology Medication Tips: Please keep the boxes that topical medications come in in order to help keep track of the instructions about where and how to use these. Pharmacies typically print the medication instructions only on the boxes and not directly on the medication tubes.   If your medication is too expensive, please contact our office at [phone] option 4 or send us a message through MyChart.   We are unable to tell what your co-pay for medications will be in advance as this is different depending on your insurance coverage. However, we may be able to find a substitute medication at lower cost or fill out paperwork to get insurance to cover a needed medication.   If a prior authorization is required to get your medication covered by your insurance company, please allow us 1-2 business days to complete this process.  Drug prices often vary depending on where the prescription is filled and some pharmacies may offer cheaper prices.  The website www.goodrx.com contains coupons for medications through different pharmacies. The prices here do not account for what the cost may be with help from insurance (it may be cheaper with your insurance), but the website can   give you the price if you did not use any insurance.  - You can print the associated coupon and take it with your prescription to the pharmacy.  - You may also stop by our office during regular business hours and pick up a GoodRx coupon card.  - If you need your prescription sent electronically to a different pharmacy, notify our office through [facility] MyChart or by phone at [phone] option 4.     Si Usted Necesita Algo Despus de Su Visita  Tambin puede  enviarnos un mensaje a travs de MyChart. Por lo general respondemos a los mensajes de MyChart en el transcurso de 1 a 2 das hbiles.  Para renovar recetas, por favor pida a su farmacia que se ponga en contacto con nuestra oficina. Nuestro nmero de fax es el [phone].  Si tiene un asunto urgente cuando la clnica est cerrada y que no puede esperar hasta el siguiente da hbil, puede llamar/localizar a su doctor(a) al nmero que aparece a continuacin.   Por favor, tenga en cuenta que aunque hacemos todo lo posible para estar disponibles para asuntos urgentes fuera del horario de oficina, no estamos disponibles las 24 horas del da, los 7 das de la semana.   Si tiene un problema urgente y no puede comunicarse con nosotros, puede optar por buscar atencin mdica  en el consultorio de su doctor(a), en una clnica privada, en un centro de atencin urgente o en una sala de emergencias.  Si tiene una emergencia mdica, por favor llame inmediatamente al 911 o vaya a la sala de emergencias.  Nmeros de bper  - Dr. Kowalski: [phone]  - Dra. Moye: [phone]  - Dra. Stewart: [phone]  En caso de inclemencias del tiempo, por favor llame a nuestra lnea principal al [phone] para una actualizacin sobre el estado de cualquier retraso o cierre.  Consejos para la medicacin en dermatologa: Por favor, guarde las cajas en las que vienen los medicamentos de uso tpico para ayudarle a seguir las instrucciones sobre dnde y cmo usarlos. Las farmacias generalmente imprimen las instrucciones del medicamento slo en las cajas y no directamente en los tubos del medicamento.   Si su medicamento es muy caro, por favor, pngase en contacto con nuestra oficina llamando al [phone] y presione la opcin 4 o envenos un mensaje a travs de MyChart.   No podemos decirle cul ser su copago por los medicamentos por adelantado ya que esto es diferente dependiendo de la cobertura de su seguro.  Sin embargo, es posible que podamos encontrar un medicamento sustituto a menor costo o llenar un formulario para que el seguro cubra el medicamento que se considera necesario.   Si se requiere una autorizacin previa para que su compaa de seguros cubra su medicamento, por favor permtanos de 1 a 2 das hbiles para completar este proceso.  Los precios de los medicamentos varan con frecuencia dependiendo del lugar de dnde se surte la receta y alguna farmacias pueden ofrecer precios ms baratos.  El sitio web www.goodrx.com tiene cupones para medicamentos de diferentes farmacias. Los precios aqu no tienen en cuenta lo que podra costar con la ayuda del seguro (puede ser ms barato con su seguro), pero el sitio web puede darle el precio si no utiliz ningn seguro.  - Puede imprimir el cupn correspondiente y llevarlo con su receta a la farmacia.  - Tambin puede pasar por nuestra oficina durante el horario de atencin regular y recoger una tarjeta de cupones de GoodRx.  -   Si necesita que su receta se enve electrnicamente a una farmacia diferente, informe a nuestra oficina a travs de MyChart de [place] o por telfono llamando al [phone] y presione la opcin 4.  

## 2023-07-10 ENCOUNTER — Encounter: Payer: Self-pay | Admitting: Dermatology

## 2023-07-11 ENCOUNTER — Other Ambulatory Visit: Admit: 2023-07-11 | Payer: Medicare Other

## 2023-07-11 ENCOUNTER — Ambulatory Visit
Admission: RE | Admit: 2023-07-11 | Discharge: 2023-07-11 | Disposition: A | Discharge status: Home / Self Care | Payer: Medicare Other | Source: Ambulatory Visit | Attending: Physician Assistant | Admitting: Physician Assistant

## 2023-07-11 ENCOUNTER — Ambulatory Visit
Admission: RE | Admit: 2023-07-11 | Discharge: 2023-07-11 | Disposition: A | Discharge status: Home / Self Care | Payer: Medicare Other | Attending: Physician Assistant | Admitting: Physician Assistant

## 2023-07-11 DIAGNOSIS — N200 Calculus of kidney: Secondary | ICD-10-CM | POA: Insufficient documentation

## 2023-07-12 ENCOUNTER — Ambulatory Visit: Payer: Medicare Other | Admitting: Urology

## 2023-07-12 VITALS — BP 135/83 | HR 74 | Ht 64.0 in | Wt 189.0 lb

## 2023-07-12 DIAGNOSIS — N200 Calculus of kidney: Secondary | ICD-10-CM

## 2023-07-12 DIAGNOSIS — R3129 Other microscopic hematuria: Secondary | ICD-10-CM

## 2023-07-12 LAB — URINALYSIS, COMPLETE
Bilirubin, UA: NEGATIVE
Glucose, UA: NEGATIVE
Ketones, UA: NEGATIVE
Nitrite, UA: NEGATIVE
Protein,UA: NEGATIVE
Specific Gravity, UA: 1.02 (ref 1.005–1.030)
Urobilinogen, Ur: 0.2 mg/dL (ref 0.2–1.0)
pH, UA: 6.5 (ref 5.0–7.5)

## 2023-07-12 LAB — MICROSCOPIC EXAMINATION: WBC, UA: >30 /hpf — AB (ref 0–5)

## 2023-07-12 NOTE — Progress Notes (Signed)
   07/12/23  CC:  Chief Complaint  Patient presents with   Cysto    HPI: 72 year old female with a personal history of persistent microscopic hematuria, partial staghorn renal calculus and recurrent UTIs who presents today for cystoscopic evaluation.  Her last cystoscopy was 2 years ago.  Since last visit, she was diagnosed with recurrent endometrial cancer.  She was first diagnosed with this in 2029.  She is now under the care of Dr. Sonia Side.  She has since undergone biopsy followed by radiation therapy with excellent response.  KUB shows minimally enlarged chronic staghorn from which she is asymptomatic.  She denies any flank pain or issues from her stone.  NED. A&Ox3.   No respiratory distress   Abd soft, NT, ND Normal external genitalia with patent urethral meatus  Cystoscopy Procedure Note  Patient identification was confirmed, informed consent was obtained, and patient was prepped using Betadine solution.  Lidocaine jelly was administered per urethral meatus.    Procedure: - Flexible cystoscope introduced, without any difficulty.   - Thorough search of the bladder revealed:    normal urethral meatus    normal urothelium with some mild nonspecific inflammatory changes but no erythema or papillary tumors noted    no stones    no ulcers     no tumors    no urethral polyps    no trabeculation  - Ureteral orifices were normal in position and appearance.  Post-Procedure: - Patient tolerated the procedure well  Assessment/ Plan:  1. Microscopic hematuria Cystoscopy today is reassuring, source of microscopic hematuria likely secondary to staghorn calculus- chronic issue  She chronically has this condition however now that she has had radiation exposure to her bladder x 2, we discussed the option of more frequent cystoscopy as, annually given inability to rule out underlying bladder pathology as the cause.  She is agreeable this plan which are next year.  If she has any  episodes of gross hematuria in the absence of infection or irritative urinary symptoms, would recommend cystoscopy and upper tract imaging sooner - Urinalysis, Complete  2. 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

## 2023-07-20 ENCOUNTER — Inpatient Hospital Stay: Payer: Medicare Other | Attending: Obstetrics and Gynecology | Admitting: Obstetrics and Gynecology

## 2023-07-20 DIAGNOSIS — Z90722 Acquired absence of ovaries, bilateral: Secondary | ICD-10-CM | POA: Diagnosis not present

## 2023-07-20 DIAGNOSIS — C7989 Secondary malignant neoplasm of other specified sites: Secondary | ICD-10-CM | POA: Insufficient documentation

## 2023-07-20 DIAGNOSIS — Z923 Personal history of irradiation: Secondary | ICD-10-CM | POA: Diagnosis not present

## 2023-07-20 DIAGNOSIS — E7800 Pure hypercholesterolemia, unspecified: Secondary | ICD-10-CM | POA: Insufficient documentation

## 2023-07-20 DIAGNOSIS — Z8542 Personal history of malignant neoplasm of other parts of uterus: Secondary | ICD-10-CM | POA: Diagnosis not present

## 2023-07-20 DIAGNOSIS — M8580 Other specified disorders of bone density and structure, unspecified site: Secondary | ICD-10-CM | POA: Diagnosis not present

## 2023-07-20 DIAGNOSIS — Z90710 Acquired absence of both cervix and uterus: Secondary | ICD-10-CM | POA: Insufficient documentation

## 2023-07-20 DIAGNOSIS — Z170 Estrogen receptor positive status [ER+]: Secondary | ICD-10-CM | POA: Diagnosis not present

## 2023-07-20 DIAGNOSIS — C549 Malignant neoplasm of corpus uteri, unspecified: Secondary | ICD-10-CM

## 2023-07-20 MED ORDER — LETROZOLE 2.5 MG PO TABS: 2.5000 mg | ORAL_TABLET | Freq: Every day | ORAL | 1 refills | Status: DC

## 2023-07-20 NOTE — Progress Notes (Signed)
Gynecologic Oncology Interval Visit   Referring Provider: Dr. Feliberto Gottron  Chief Complaint: Right pelvic recurrence of grade 1 endometrial cancer Subjective:  Morgan Dalton is a 72 y.o. female who is seen in consultation from Dr. Graciela Husbands with history of endometrial cancer in 2009 who presents for discussion of imaging for incidental right pelvic mass.   She presents for a telephone visit today for discussion of management options.  We have previously discussed Megace versus letrozole and she was getting information about both medications.  She expressed concerns that she has hypercholesterolemia and that letrozole could increase her cholesterol levels.  After reviewing the different options she would like to proceed with letrozole  DEXA 08/09/2022 INTERPRETATION:  Osteopenia/Low bone mass. No significant change in bone mineral density from  08-08-2020.  She is followed by her primary care provider.  With regard to the osteopenia he recommended calcium 300 mg once daily and Vit D 1000U daily.  She is planned to have her next DEXA in 2025   Gynecologic History:  She underwent vaginal hysterectomy with Dr Feliberto Gottron at [facility] in 2009. 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 She was referred to Dr. Hyacinth Meeker at [facility] and underwent robotic assisted bilateral salpino-oophorectomy, bilateral pelvic lymph node dissection, supraumbilical laparotomy and periaortic lymph node dissection. Surgery complicated by adhesions. Final pathology revealed a deeply invasive (two thirds), well differentiated endometrioid adenocarcinoma, stage Ic. Washings cervix and adnexa were negative. She elected for adjuvant vaginal brachytherapy at [facility] and completed treatment 02/02/2008.   Patient went to ER for RUQ abdominal pain. CT 01/21/23 showed diverticulitis at hepatic flexure with some free air. Treated with antibiotics for diverticulitis.  Incidental finding of solid mass of right posterior  adnexa measuring 3.2 x 2.2 cm, new since previous CT scan in 2020. She also has a large staghorn calculus in the right kidney.  She was seen by Dr. Feliberto Gottron in [place].   Patient provided Morgan Dalton with her records from her endometrial cancer which were reviewed in detail, copied, and scanned into her chart.  Last colonoscopy 11/07/2018- report reviewed and showed tortuous colon, diverticulosis, recommendation to repeat in 5 years (2024)  01/30/23- MRI Abdomen and Pelvis w wo contrast S/p hysterectomy. Left ovary not visualized. Solid heterogeneously enhancing right ovarian lesion measuring 3.0 x 1.9 x 2.3 cm. No ascites. No lymphadenopathy. Inflammation of proximal transverse colon consistent with residual diverticulitis.  MRI was addended to reflect that mass was present on CT from 2020 and measured 1.6 x 1.2 cm at that time. No definite communication with the colon or small bowel loops and the appendix is identified as separate from this lesion.   HE4- 142 (elevated) CA 125 - 18 (normal) ROMA- 30.57% (high risk)  01/25/23- Pap- NILM  A PET scan was performed to better ascertain the etiology of the mass. 02/10/23- PET 1. Hypermetabolic solid mass in the RIGHT pelvis consistent with ovarian neoplasm. 2. No evidence of metastatic adenopathy in the pelvis or periaortic retroperitoneum. 3. No evidence of metastatic disease outside the pelvis. 4. Post hysterectomy anatomy. No abnormal metabolic activity associated with the vagina. 5. RIGHT renal staghorn calculus.  Imaging was reviewed by Dr. Amil Amen. Report was addended to reflect that based on her prior surgical hysterectomy and oophorectomy, intensely hypermetabolic solid mass is favored to be a neoplastic deposit, potentially from remote endometrial carcinoma.   03/04/23 Image guided biopsy of right pelvic mass. DIAGNOSIS: A. SOFT TISSUE, RIGHT ADNEXAL; CT-GUIDED CORE NEEDLE BIOPSY: - POSITIVE FOR MALIGNANCY. - METASTATIC ADENOCARCINOMA,  COMPATIBLE WITH ENDOMETRIOID ADENOCARCINOMA.  Comment: The patient's prior history of endometrioid adenocarcinoma in 2009 is noted.  Biopsy sections display an abnormal cribriform and somewhat papillary glandular proliferation, histologically compatible with the patient's previously diagnosed endometrioid adenocarcinoma. Immunohistochemical studies show tumor cells to be positive for pancytokeratin, PAX8, estrogen receptor (greater than 90%, strong), and progesterone receptor (greater than 90%, strong).  Tumor cells are negative for CK7, WT1, CDX2, and Napsin A.  P53 demonstrates a wild-type (non-mutated) pattern of expression.  Taken together, the morphologic and immunophenotypic findings support the above diagnosis.  Tumor cells are positive for estrogen receptor (greater than 90%,  strong) and progesterone receptor (greater than 90%, strong).  Tumor  cells display wild-type (9 mutated) expression of p53.    DNA Mismatch Repair (MMR)  MLH1: Intact nuclear expression  MSH2: Intact nuclear expression  MSH6: Intact nuclear expression  PMS2: Intact nuclear expression   04/07/2023-05/12/2023 total dose with 50 Gy completed radiation.   06/24/2023 CT A/P Findings: The lesion centered just superior and lateral to the right side of the vaginal cuff measures 2.0 x 1.6 cm on 72/2. It is centrally hypoattenuating today (presumably secondary to necrosis). This makes differentiation from surrounding bowel loops challenging. Decreased in size from 3.2 x 2.2 cm on the prior CT.  She was counseled about Megace versus letrozole.   Problem List: Patient Active Problem List   Diagnosis Date Noted   Endometrial cancer (HCC) 03/20/2023   Benign essential hypertension 01/27/2023   Diverticulitis 01/26/2023   Thrombocytosis 11/29/2019   GERD (gastroesophageal reflux disease) 12/31/2016   History of nephrolithiasis 12/31/2016   Hyperlipidemia, unspecified 12/31/2016   Insomnia 12/31/2016    Osteopenia 12/31/2016   Acquired hypothyroidism 10/26/2016   Bladder neoplasm of uncertain malignant potential 09/09/2016   Bladder pain 08/11/2016   Chronic cystitis 08/11/2016   Mild episode of recurrent major depressive disorder (HCC) 07/23/2016   History of endometrial cancer 01/23/2016   Renal colic 06/19/2014   Right upper quadrant pain 08/07/2012   Kidney stone 08/07/2012   Gross hematuria 08/07/2012   Malignant neoplasm of corpus uteri (HCC) 08/07/2012   Microscopic hematuria 08/07/2012   Urinary tract infection 08/07/2012    Past Medical History: Past Medical History:  Diagnosis Date   Ankle fracture, left    Anxiety    Cancer (HCC)    Uterine Cancer   Dyspepsia    Endometrial cancer (HCC)    GERD (gastroesophageal reflux disease)    History of kidney stones    Hx of dysplastic nevus 10/16/2010   Right lower leg. Severe atypia. Excised: 11/25/2010. 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 Well differentiated. Tx: [facility]    Past Surgical History: Past Surgical History:  Procedure Laterality Date   ABDOMINAL HYSTERECTOMY     CATARACT EXTRACTION W/PHACO Right 12/07/2016   Procedure: CATARACT EXTRACTION PHACO AND INTRAOCULAR LENS PLACEMENT (IOC);  Surgeon: Galen Manila, MD;  Location: [facility] ORS;  Service: Ophthalmology;  Laterality: Right;  Morgan Dalton 42.5 AP% 23.2 CDE 9.87 Fluid pack lot # [id number]   CATARACT EXTRACTION W/PHACO Left 01/04/2017   Procedure: CATARACT EXTRACTION PHACO AND INTRAOCULAR LENS PLACEMENT (IOC);  Surgeon: Galen Manila, MD;  Location: [facility] ORS;  Service: Ophthalmology;  Laterality: Left;  Morgan Dalton 39.2 AP% 23.9 CDE 9.38 Fluid Pack Lot # [id number]   CHOLECYSTECTOMY  2014  COLONOSCOPY  2014   COLONOSCOPY WITH ESOPHAGOGASTRODUODENOSCOPY (EGD)  2004   COLONOSCOPY WITH PROPOFOL N/A 11/07/2018   Procedure:  COLONOSCOPY WITH PROPOFOL;  Surgeon: Christena Deem, MD;  Location: [facility] ENDOSCOPY;  Service: Endoscopy;  Laterality: N/A;   DILATION AND CURETTAGE OF UTERUS     EXTRACORPOREAL SHOCK WAVE LITHOTRIPSY     EYE SURGERY     GANGLION CYST EXCISION Right    wrist   HALLUX VALGUS LAPIDUS Left 08/11/2022   Procedure: HALLUX VALGUS LAPIDUS;  Surgeon: Gwyneth Revels, DPM;  Location: [facility] ORS;  Service: Podiatry;  Laterality: Left;   KIDNEY STONE SURGERY     several   KNEE ARTHROSCOPY Left 2014   partial medial meniscectomy, medial chondroplasty   NM PET TUMOR IMAGING  02/07/2023   TOTAL ABDOMINAL HYSTERECTOMY W/ BILATERAL SALPINGOOPHORECTOMY  2009    Past Gynecologic History:  G2P2 Post menopausal Menarche: age 53  OB History:  OB History  No obstetric history on file.    Family History: Family History  Problem Relation Age of Onset   Breast cancer Paternal Aunt     Social History: Social History   Socioeconomic History   Marital status: Married    Spouse name: Molly Maduro   Number of children: 2   Years of education: Not on file   Highest education level: Not on file  Occupational History   Not on file  Tobacco Use   Smoking status: Never   Smokeless tobacco: Never  Vaping Use   Vaping status: Never Used  Substance and Sexual Activity   Alcohol use: No   Drug use: No   Sexual activity: Not on file  Other Topics Concern   Not on file  Social History Narrative   Not on file   Social Determinants of Health   Financial Resource Strain: Low Risk  (05/11/2023)   Received from [facility], [facility]   Overall Financial Resource Strain (CARDIA)    Difficulty of Paying Living Expenses: Not hard at all  Food Insecurity: No Food Insecurity (05/11/2023)   Received from [facility], [facility]   Hunger Vital Sign    Worried About Running Out of Food in the Last Year: Never true    Ran Out of Food in  the Last Year: Never true  Transportation Needs: No Transportation Needs (05/11/2023)   Received from [facility], [facility]   [facility] - Transportation    In the past 12 months, has lack of transportation kept you from medical appointments or from getting medications?: No    Lack of Transportation (Non-Medical): No  Physical Activity: Not on file  Stress: Not on file  Social Connections: Not on file  Intimate Partner Violence: Not At Risk (03/18/2023)   Humiliation, Afraid, Rape, and Kick questionnaire    Fear of Current or Ex-Partner: No    Emotionally Abused: No    Physically Abused: No    Sexually Abused: No    Allergies: Allergies  Allergen Reactions   Statins Other (See Comments)    Myalgia     Current Medications: Current Outpatient Medications  Medication Sig Dispense Refill   letrozole (FEMARA) 2.5 MG tablet Take 1 tablet (2.5 mg total) by mouth daily. 30 tablet 1   aspirin EC 81 MG tablet Take 81 mg by mouth daily.      cholecalciferol (VITAMIN D3) 25 MCG (1000 UNIT) tablet Take 1,000 Units by mouth daily.  Coenzyme Q10 (CO Q-10) 100 MG CAPS Take 100 mg by mouth daily.     Cranberry 500 MG TABS Take by mouth.     ezetimibe (ZETIA) 10 MG tablet Take 10 mg by mouth daily.     Glucosamine-Chondroitin 750-600 MG TABS Take 1 tablet by mouth daily.     hydrochlorothiazide (HYDRODIURIL) 25 MG tablet Take 12.5 mg by mouth daily.     Krill Oil 1000 MG CAPS Take by mouth.     levothyroxine (SYNTHROID, LEVOTHROID) 25 MCG tablet Take 25 mcg by mouth daily before breakfast.     losartan (COZAAR) 50 MG tablet Take 1 tablet by mouth daily.     Multiple Vitamins-Minerals (WOMENS 50+ MULTI VITAMIN PO) Take by mouth.     pantoprazole (PROTONIX) 40 MG tablet Take 40 mg by mouth every other day.     rosuvastatin (CRESTOR) 5 MG tablet Take 5 mg by mouth once a week. Thursday night     sertraline (ZOLOFT) 50 MG tablet Take 50 mg by mouth daily.      No current facility-administered medications for this visit.   Review of Systems N/A   Objective:  Physical Examination:  There were no vitals taken for this visit.    Deferred.     Lab Review No labs on site today  Radiologic Imaging: 06/28/2023 CT ABDOMEN AND PELVIS WITH CONTRAST   TECHNIQUE: Multidetector CT imaging of the abdomen and pelvis was performed using the standard protocol following bolus administration of intravenous contrast.   RADIATION DOSE REDUCTION: This exam was performed according to the departmental dose-optimization program which includes automated exposure control, adjustment of the mA and/or kV according to patient size and/or use of iterative reconstruction technique.   CONTRAST:  OMNIPAQUE IOHEXOL 300 MG/ML  SOLN   COMPARISON:  02/10/2023 PET.  Abdominopelvic MRI of 01/30/2023.   FINDINGS: Lower chest: Left lower lobe calcified granuloma. Borderline cardiomegaly.   Hepatobiliary: Suspect mild hepatic steatosis. Mild hepatomegaly at 18.7 cm craniocaudal. Cholecystectomy, without biliary ductal dilatation.   Pancreas: Normal, without mass or ductal dilatation.   Spleen: Normal in size, without focal abnormality.   Adrenals/Urinary Tract: Normal adrenal glands. Normal left kidney for age. Branching, staghorn type calculus again identified within the lower pole right renal collecting system and extending into the right renal pelvis, similar. No significant obstruction. Normal urinary bladder.   Stomach/Bowel: Normal stomach, without wall thickening. Scattered colonic diverticula. Colonic stool burden suggests constipation. Normal terminal ileum. Normal small bowel.   Vascular/Lymphatic: Aortic atherosclerosis. No abdominopelvic adenopathy.   Reproductive: Hysterectomy. The lesion centered just superior and lateral to the right side of the vaginal cuff measures 2.0 x 1.6 cm on 72/2. It is centrally hypoattenuating today  (presumably secondary to necrosis). This makes differentiation from surrounding bowel loops challenging. Decreased in size from 3.2 x 2.2 cm on the prior CT.   No new adnexal mass.   Other: No significant free fluid. No evidence of omental or peritoneal disease.   Musculoskeletal: Osteopenia. Degenerative disc disease at the lumbosacral junction.   IMPRESSION: 1. Response to therapy of right pelvic mass, as evidenced by decrease in size and development of central necrosis. 2. No new or progressive disease. 3. Similar right lower pole staghorn type renal calculus. 4. Hepatomegaly and possible hepatic steatosis. 5.  Aortic Atherosclerosis (ICD10-I70.0).    Assessment:  Morgan Dalton is a 72 y.o. female diagnosed with grade 1 (p53wt, ER+, pMMR) deeply invasive endometrioid endometrial cancer in 2009, isolated vaginal recurrence  status post radiation therapy with excellent response based on exam and imaging.  She would like to proceed with letrozole.  Vaginal exam is notable for severe radiation changes and atrophy.   Osteopenia  Medical co-morbidities complicating care: diverticulitis, hypothyroid.  Plan:   Problem List Items Addressed This Visit       Genitourinary   Malignant neoplasm of corpus uteri [facility]) - Primary   Relevant Medications   letrozole (FEMARA) 2.5 MG tablet    We previously reviewed NCCN guidelines regarding management for local recurrence.  The recommendations are for radiation therapy with or without brachytherapy with or without systemic therapy. We reviewed systemic therapy options including hormonal therapy and chemotherapy.  Given that she has high hormone receptor status I recommended either Megace or letrozole.  She would like to proceed with letrozole.  She was previously given information regarding side effects of letrozole.  I submitted an order for the prescription and 2.5 mg daily.  Plan for telephone visit in 1 month to see how she is tolerating  therapy.  If she is tolerating therapy will order a DEXA scan to obtain a new baseline for her.  With diagnosis of of osteopenia she is at increased risk for osteoporosis.  Plan on CT scan A/P and in person follow-up in 3 months.  She had a PET scan in 22/29024 which was negative for any pulmonary findings.  Discussed with Silva Bandy and she will make the needed appointments.  HE4 elevation likely related to inflammation or kidney disease.  She asked about using blood based tumor markers to follow her disease and I did not recommend doing that given that her a HE4 elevation may not be related to her malignancy.    The patient's diagnosis, an outline of the further diagnostic and laboratory studies which will be required, the recommendation for surgery, and alternatives were discussed with her and her accompanying family members.  All questions were answered to their satisfaction.   I connected with  KALIAH COPPES on 07/20/23 by a video enabled telemedicine application and verified that I am speaking with the correct person using two identifiers.   The patient expressed understanding and agreed to proceed.    Leta Jungling, MD

## 2023-08-24 ENCOUNTER — Inpatient Hospital Stay: Payer: Medicare Other

## 2023-08-25 ENCOUNTER — Encounter: Payer: Self-pay | Admitting: Dermatology

## 2023-08-25 ENCOUNTER — Ambulatory Visit (INDEPENDENT_AMBULATORY_CARE_PROVIDER_SITE_OTHER): Payer: Medicare Other | Admitting: Dermatology

## 2023-08-25 VITALS — BP 132/75 | HR 65

## 2023-08-25 DIAGNOSIS — L570 Actinic keratosis: Secondary | ICD-10-CM

## 2023-08-25 DIAGNOSIS — D171 Benign lipomatous neoplasm of skin and subcutaneous tissue of trunk: Secondary | ICD-10-CM

## 2023-08-25 DIAGNOSIS — L820 Inflamed seborrheic keratosis: Secondary | ICD-10-CM

## 2023-08-25 DIAGNOSIS — Z8589 Personal history of malignant neoplasm of other organs and systems: Secondary | ICD-10-CM

## 2023-08-25 DIAGNOSIS — Z86018 Personal history of other benign neoplasm: Secondary | ICD-10-CM

## 2023-08-25 DIAGNOSIS — L578 Other skin changes due to chronic exposure to nonionizing radiation: Secondary | ICD-10-CM

## 2023-08-25 DIAGNOSIS — Z1283 Encounter for screening for malignant neoplasm of skin: Secondary | ICD-10-CM

## 2023-08-25 DIAGNOSIS — D229 Melanocytic nevi, unspecified: Secondary | ICD-10-CM

## 2023-08-25 DIAGNOSIS — L814 Other melanin hyperpigmentation: Secondary | ICD-10-CM

## 2023-08-25 NOTE — Patient Instructions (Addendum)

## 2023-08-25 NOTE — Progress Notes (Signed)
Follow-Up Visit   Subjective  Morgan Dalton is a 72 y.o. female who presents for the following: Skin Cancer Screening and Full Body Skin Exam hx of aks, hx of isks, hx of scc   The patient presents for Total-Body Skin Exam (TBSE) for skin cancer screening and mole check. The patient has spots, moles and lesions to be evaluated, some may be new or changing and the patient may have concern these could be cancer.    The following portions of the chart were reviewed this encounter and updated as appropriate: medications, allergies, medical history  Review of Systems:  No other skin or systemic complaints except as noted in HPI or Assessment and Plan.  Objective  Well appearing patient in no apparent distress; mood and affect are within normal limits.  A full examination was performed including scalp, head, eyes, ears, nose, lips, neck, chest, axillae, abdomen, back, buttocks, bilateral upper extremities, bilateral lower extremities, hands, feet, fingers, toes, fingernails, and toenails. All findings within normal limits unless otherwise noted below.   Relevant physical exam findings are noted in the Assessment and Plan.  nose x 1, left ear x 1 (2) Erythematous thin papules/macules with gritty scale.   mid back x 6, left flank x 2 (8) Erythematous stuck-on, waxy papule or plaque    Assessment & Plan   SKIN CANCER SCREENING PERFORMED TODAY.  ACTINIC DAMAGE - Chronic condition, secondary to cumulative UV/sun exposure - diffuse scaly erythematous macules with underlying dyspigmentation - Recommend daily broad spectrum sunscreen SPF 30+ to sun-exposed areas, reapply every 2 hours as needed.  - Staying in the shade or wearing long sleeves, sun glasses (UVA+UVB protection) and wide brim hats (4-inch brim around the entire circumference of the hat) are also recommended for sun protection.  - Call for new or changing lesions.  LENTIGINES, SEBORRHEIC KERATOSES, HEMANGIOMAS - Benign  normal skin lesions - Benign-appearing - Call for any changes  Lipoma  Exam: Subcutaneous rubbery nodules  Location: back and left lower leg   Benign-appearing. Exam most consistent with a lipoma. Discussed that a lipoma is a benign fatty growth that can grow over time and sometimes get irritated. Recommend observation if it is not bothersome or changing. Discussed option of ILK injections or surgical excision to remove it if it is growing, symptomatic, or other changes noted. Please call for new or changing lesions so they can be evaluated.   MELANOCYTIC NEVI - Tan-brown and/or pink-flesh-colored symmetric macules and papules - Benign appearing on exam today - Observation - Call clinic for new or changing moles - Recommend daily use of broad spectrum spf 30+ sunscreen to sun-exposed areas.   HISTORY OF DYSPLASTIC NEVUS Right lower leg severe excised 11/2010 No evidence of recurrence today Recommend regular full body skin exams Recommend daily broad spectrum sunscreen SPF 30+ to sun-exposed areas, reapply every 2 hours as needed.  Call if any new or changing lesions are noted between office visits  HISTORY OF SQUAMOUS CELL CARCINOMA OF THE SKIN Right temple 2020 - No evidence of recurrence today - No lymphadenopathy - Recommend regular full body skin exams - Recommend daily broad spectrum sunscreen SPF 30+ to sun-exposed areas, reapply every 2 hours as needed.  - Call if any new or changing lesions are noted between office visits    Actinic keratosis (2) nose x 1, left ear x 1  Actinic keratoses are precancerous spots that appear secondary to cumulative UV radiation exposure/sun exposure over time. They are chronic with  expected duration over 1 year. A portion of actinic keratoses will progress to squamous cell carcinoma of the skin. It is not possible to reliably predict which spots will progress to skin cancer and so treatment is recommended to prevent development of skin  cancer.  Recommend daily broad spectrum sunscreen SPF 30+ to sun-exposed areas, reapply every 2 hours as needed.  Recommend staying in the shade or wearing long sleeves, sun glasses (UVA+UVB protection) and wide brim hats (4-inch brim around the entire circumference of the hat). Call for new or changing lesions.  Destruction of lesion - nose x 1, left ear x 1 (2) Complexity: simple   Destruction method: cryotherapy   Informed consent: discussed and consent obtained   Timeout:  patient name, date of birth, surgical site, and procedure verified Lesion destroyed using liquid nitrogen: Yes   Region frozen until ice ball extended beyond lesion: Yes   Outcome: patient tolerated procedure well with no complications   Post-procedure details: wound care instructions given    Inflamed seborrheic keratosis (8) mid back x 6, left flank x 2  Symptomatic, irritating, patient would like treated.  Destruction of lesion - mid back x 6, left flank x 2 (8) Complexity: simple   Destruction method: cryotherapy   Informed consent: discussed and consent obtained   Timeout:  patient name, date of birth, surgical site, and procedure verified Lesion destroyed using liquid nitrogen: Yes   Region frozen until ice ball extended beyond lesion: Yes   Outcome: patient tolerated procedure well with no complications   Post-procedure details: wound care instructions given     Return in about 1 year (around 08/24/2024) for TBSE.  IAsher Muir, CMA, am acting as scribe for Armida Sans, MD.   Documentation: I have reviewed the above documentation for accuracy and completeness, and I agree with the above.  Armida Sans, MD

## 2023-08-26 ENCOUNTER — Other Ambulatory Visit: Payer: Self-pay

## 2023-08-28 ENCOUNTER — Encounter: Payer: Self-pay | Admitting: Dermatology

## 2023-08-30 ENCOUNTER — Inpatient Hospital Stay: Payer: Medicare Other | Attending: Obstetrics and Gynecology | Admitting: Nurse Practitioner

## 2023-08-30 DIAGNOSIS — M8589 Other specified disorders of bone density and structure, multiple sites: Secondary | ICD-10-CM

## 2023-08-30 DIAGNOSIS — C549 Malignant neoplasm of corpus uteri, unspecified: Secondary | ICD-10-CM | POA: Diagnosis not present

## 2023-08-30 DIAGNOSIS — Z79811 Long term (current) use of aromatase inhibitors: Secondary | ICD-10-CM | POA: Diagnosis not present

## 2023-08-30 DIAGNOSIS — Z5181 Encounter for therapeutic drug level monitoring: Secondary | ICD-10-CM

## 2023-08-30 MED ORDER — LETROZOLE 2.5 MG PO TABS
2.5000 mg | ORAL_TABLET | Freq: Every day | ORAL | 0 refills | Status: DC
Start: 2023-08-30 — End: 2023-12-09

## 2023-08-30 NOTE — Progress Notes (Unsigned)
Virtual Visit Progress Note  [facility] at [facility] A Department of the [place]. [facility] [address], [address] [place], [place] [address] [phone] (phone) [phone] (fax)  I connected with Morgan Dalton on 08/30/23 at  2:45 PM EDT by telephone visit and verified that I am speaking with the correct person using two identifiers.   I discussed the limitations, risks, security and privacy concerns of performing an evaluation and management service by telemedicine and the availability of in-person appointments. I also discussed with the patient that there may be a patient responsible charge related to this service. The patient expressed understanding and agreed to proceed.   Other persons participating in the visit and their role in the encounter: none   Patient's location: home  Provider's location: clinic   Chief Complaint: Follow up for letrozole    Patient Care Team: Lynnea Ferrier, MD as PCP - General (Internal Medicine)   Name of the patient: Morgan Dalton  [id number]  03/31/1951   Date of visit: 08/30/23  Diagnosis- Endometrial Cancer  Chief complaint/ Reason for visit- follow up for letrozole  Heme/Onc history:  Oncology History  Endometrial cancer (HCC)  03/20/2023 Initial Diagnosis   Endometrial cancer   03/20/2023 Cancer Staging   Staging form: Corpus Uteri - Carcinoma and Carcinosarcoma, AJCC 8th Edition - Clinical stage from 03/20/2023: FIGO Stage III (rcT3, cN0, cM0) - Signed by Creig Hines, MD on 03/20/2023 Stage prefix: Recurrence     Interval history- Patient is 72  year old female, with history of endometrial cancer, started on letrozole by Dr Sonia Side, who agrees to telephone visit for follow up. She has been taking medication daily. Has some generalized aches and pains but doesn't feel they are worsening or severe. Overall feels she is tolerating medication well and denies complaints.   Review of systems-  Review of Systems  Constitutional:  Negative for malaise/fatigue and weight loss.  Respiratory:  Negative for shortness of breath.   Cardiovascular:  Negative for chest pain and palpitations.  Gastrointestinal:  Negative for constipation and diarrhea.  Genitourinary:  Negative for frequency.  Musculoskeletal:  Positive for joint pain. Negative for back pain and myalgias.  Neurological:  Negative for sensory change and headaches.  Psychiatric/Behavioral:  Negative for depression. The patient is not nervous/anxious and does not have insomnia.      Allergies  Allergen Reactions   Statins Other (See Comments)    Myalgia     Past Medical History:  Diagnosis Date   Ankle fracture, left    Anxiety    Cancer (HCC)    Uterine Cancer   Dyspepsia    Endometrial cancer (HCC)    GERD (gastroesophageal reflux disease)    History of kidney stones    Hx of dysplastic nevus 10/16/2010   Right lower leg. Severe atypia. Excised: 11/25/2010. 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 Well differentiated. Tx: [facility]    Past Surgical History:  Procedure Laterality Date   ABDOMINAL HYSTERECTOMY     CATARACT EXTRACTION W/PHACO Right 12/07/2016   Procedure: CATARACT EXTRACTION PHACO AND INTRAOCULAR LENS PLACEMENT (IOC);  Surgeon: Galen Manila, MD;  Location: [facility] ORS;  Service: Ophthalmology;  Laterality: Right;  [place] 42.5 AP% 23.2 CDE 9.87 Fluid pack lot # [id number]  CATARACT EXTRACTION W/PHACO Left 01/04/2017   Procedure: CATARACT EXTRACTION PHACO AND INTRAOCULAR LENS PLACEMENT (IOC);  Surgeon: Galen Manila, MD;  Location: [facility] ORS;  Service: Ophthalmology;  Laterality: Left;  [place] 39.2 AP% 23.9 CDE 9.38 Fluid Pack Lot # [id number]   CHOLECYSTECTOMY  2014   COLONOSCOPY  2014   COLONOSCOPY WITH ESOPHAGOGASTRODUODENOSCOPY (EGD)  2004   COLONOSCOPY WITH  PROPOFOL N/A 11/07/2018   Procedure: COLONOSCOPY WITH PROPOFOL;  Surgeon: Christena Deem, MD;  Location: [facility] ENDOSCOPY;  Service: Endoscopy;  Laterality: N/A;   DILATION AND CURETTAGE OF UTERUS     EXTRACORPOREAL SHOCK WAVE LITHOTRIPSY     EYE SURGERY     GANGLION CYST EXCISION Right    wrist   HALLUX VALGUS LAPIDUS Left 08/11/2022   Procedure: HALLUX VALGUS LAPIDUS;  Surgeon: Gwyneth Revels, DPM;  Location: [facility] ORS;  Service: Podiatry;  Laterality: Left;   KIDNEY STONE SURGERY     several   KNEE ARTHROSCOPY Left 2014   partial medial meniscectomy, medial chondroplasty   NM PET TUMOR IMAGING  02/07/2023   TOTAL ABDOMINAL HYSTERECTOMY W/ BILATERAL SALPINGOOPHORECTOMY  2009    Social History   Socioeconomic History   Marital status: Married    Spouse name: Molly Maduro   Number of children: 2   Years of education: Not on file   Highest education level: Not on file  Occupational History   Not on file  Tobacco Use   Smoking status: Never   Smokeless tobacco: Never  Vaping Use   Vaping status: Never Used  Substance and Sexual Activity   Alcohol use: No   Drug use: No   Sexual activity: Not on file  Other Topics Concern   Not on file  Social History Narrative   Not on file   Social Determinants of Health   Financial Resource Strain: Low Risk  (05/11/2023)   Received from [facility], [facility]   Overall Financial Resource Strain (CARDIA)    Difficulty of Paying Living Expenses: Not hard at all  Food Insecurity: No Food Insecurity (05/11/2023)   Received from [facility], [facility]   Hunger Vital Sign    Worried About Running Out of Food in the Last Year: Never true    Ran Out of Food in the Last Year: Never true  Transportation Needs: No Transportation Needs (05/11/2023)   Received from [facility], [facility]   [facility] - Transportation    In the past 12  months, has lack of transportation kept you from medical appointments or from getting medications?: No    Lack of Transportation (Non-Medical): No  Physical Activity: Not on file  Stress: Not on file  Social Connections: Not on file  Intimate Partner Violence: Not At Risk (03/18/2023)   Humiliation, Afraid, Rape, and Kick questionnaire    Fear of Current or Ex-Partner: No    Emotionally Abused: No    Physically Abused: No    Sexually Abused: No    Family History  Problem Relation Age of Onset   Breast cancer Paternal Aunt      Current Outpatient Medications:    aspirin EC 81 MG tablet, Take 81 mg by mouth daily. , Disp: , Rfl:    cholecalciferol (VITAMIN D3) 25 MCG (1000 UNIT) tablet, Take 1,000 Units by mouth daily., Disp: , Rfl:    Coenzyme Q10 (CO Q-10) 100 MG CAPS, Take 100 mg by mouth daily., Disp: , Rfl:  Cranberry 500 MG TABS, Take by mouth., Disp: , Rfl:    ezetimibe (ZETIA) 10 MG tablet, Take 10 mg by mouth daily., Disp: , Rfl:    Glucosamine-Chondroitin 750-600 MG TABS, Take 1 tablet by mouth daily., Disp: , Rfl:    hydrochlorothiazide (HYDRODIURIL) 25 MG tablet, Take 12.5 mg by mouth daily., Disp: , Rfl:    Krill Oil 1000 MG CAPS, Take by mouth., Disp: , Rfl:    letrozole (FEMARA) 2.5 MG tablet, Take 1 tablet (2.5 mg total) by mouth daily., Disp: 30 tablet, Rfl: 1   levothyroxine (SYNTHROID, LEVOTHROID) 25 MCG tablet, Take 25 mcg by mouth daily before breakfast., Disp: , Rfl:    losartan (COZAAR) 50 MG tablet, Take 1 tablet by mouth daily., Disp: , Rfl:    Multiple Vitamins-Minerals (WOMENS 50+ MULTI VITAMIN PO), Take by mouth., Disp: , Rfl:    pantoprazole (PROTONIX) 40 MG tablet, Take 40 mg by mouth every other day., Disp: , Rfl:    rosuvastatin (CRESTOR) 5 MG tablet, Take 5 mg by mouth once a week. Thursday night, Disp: , Rfl:    sertraline (ZOLOFT) 50 MG tablet, Take 50 mg by mouth daily., Disp: , Rfl:   Physical exam: Exam limited due to telemedicine  There were  no vitals filed for this visit. Physical Exam Pulmonary:     Effort: No respiratory distress.  Neurological:     Mental Status: She is oriented to person, place, and time.  Psychiatric:        Mood and Affect: Mood normal.        Behavior: Behavior normal.         Latest Ref Rng & Units 01/21/2023    9:56 AM  CMP  Glucose 70 - 99 mg/dL 086   BUN 8 - 23 mg/dL 15   Creatinine 5.78 - 1.00 mg/dL 4.69   Sodium 629 - 528 mmol/L 137   Potassium 3.5 - 5.1 mmol/L 4.2   Chloride 98 - 111 mmol/L 105   CO2 22 - 32 mmol/L 24   Calcium 8.9 - 10.3 mg/dL 8.9   Total Protein 6.5 - 8.1 g/dL 7.5   Total Bilirubin 0.3 - 1.2 mg/dL 0.8   Alkaline Phos 38 - 126 U/L 103   AST 15 - 41 U/L 30   ALT 0 - 44 U/L 31       Latest Ref Rng & Units 05/12/2023    9:45 AM  CBC  WBC 4.0 - 10.5 K/uL 4.7   Hemoglobin 12.0 - 15.0 g/dL 41.3   Hematocrit 24.4 - 46.0 % 39.4   Platelets 150 - 400 K/uL 424    No results found.  Assessment and plan- Patient is a 72 y.o. female   Endometrial Cancer- grade 1 (p53wt, ER+, pMMR), deeply invasive endometrioid endometrial cancer, diagnosed in 2009 with isolated vaginal recurrence, s/p radiation therapy with excellent response based on exam and imaging who started maintenance letrozole. Tolerating well. Reviewed rationale for medication. At risk for vasomotor symptoms and other antiestrogen effects but tolerating well currently.  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 Discuss dilator exercises and management of atrophy and agglutination at next visit.  Osteopenia- lLast bone density scan 08/09/22 was reviewed. T score - 2.0. Consistent with osteopenia. No significant change from August 2021 scan. Reviewed that anti-hormonal effects of letrozole may worsen bone density. Recommend use of calcium 1200 mg and vitamin d 1000 mcg daily along with weight bearing exercise as tolerated. Plan to repeat in 1-2 years.  Disposition:  Follow up with gyn onc as scheduled. RTC sooner as  needed.   Visit Diagnosis 1. Malignant neoplasm of corpus uteri [facility])    Patient expressed understanding and was in agreement with this plan. She also understands that She can call clinic at any time with any questions, concerns, or complaints.   I discussed the assessment and treatment plan with the patient. The patient was provided an opportunity to ask questions and all were answered. The patient agreed with the plan and demonstrated an understanding of the instructions.   The patient was advised to call back or seek an in-person evaluation if the symptoms worsen or if the condition fails to improve as anticipated.   I spent 25 minutes on this telephone encounter.   Video connection was lost at >50% of the duration of the visit, at which time the remainder of the visit was completed via audio only.  Thank you for allowing me to participate in the care of this very pleasant patient.   Consuello Masse, DNP, AGNP-[facility]

## 2023-09-01 ENCOUNTER — Encounter: Payer: Self-pay | Admitting: *Deleted

## 2023-09-02 ENCOUNTER — Ambulatory Visit: Admission: RE | Admit: 2023-09-02 | Payer: Medicare Other | Source: Home / Self Care

## 2023-09-02 ENCOUNTER — Encounter: Admission: RE | Payer: Self-pay | Source: Home / Self Care

## 2023-09-02 SURGERY — COLONOSCOPY
Anesthesia: General

## 2023-10-05 ENCOUNTER — Ambulatory Visit: Payer: Medicare Other

## 2023-10-12 ENCOUNTER — Ambulatory Visit
Admission: RE | Admit: 2023-10-12 | Discharge: 2023-10-12 | Disposition: A | Discharge status: Home / Self Care | Payer: Medicare Other | Source: Ambulatory Visit | Attending: Obstetrics and Gynecology | Admitting: Obstetrics and Gynecology

## 2023-10-12 DIAGNOSIS — C549 Malignant neoplasm of corpus uteri, unspecified: Secondary | ICD-10-CM | POA: Diagnosis present

## 2023-10-12 MED ORDER — IOHEXOL 300 MG/ML  SOLN
100.0000 mL | Freq: Once | INTRAMUSCULAR | Status: AC | PRN
  Administered 2023-10-12: 100 mL via INTRAVENOUS

## 2023-10-17 ENCOUNTER — Other Ambulatory Visit: Payer: Self-pay | Admitting: *Deleted

## 2023-10-17 ENCOUNTER — Encounter: Payer: Self-pay | Admitting: Radiation Oncology

## 2023-10-17 ENCOUNTER — Ambulatory Visit
Admission: RE | Admit: 2023-10-17 | Discharge: 2023-10-17 | Disposition: A | Discharge status: Home / Self Care | Payer: Medicare Other | Source: Ambulatory Visit | Attending: Radiation Oncology | Admitting: Radiation Oncology

## 2023-10-17 VITALS — BP 147/82 | HR 75 | Temp 97.3°F | Resp 16 | Wt 184.0 lb

## 2023-10-17 DIAGNOSIS — M545 Low back pain, unspecified: Secondary | ICD-10-CM | POA: Insufficient documentation

## 2023-10-17 DIAGNOSIS — Z79811 Long term (current) use of aromatase inhibitors: Secondary | ICD-10-CM | POA: Diagnosis not present

## 2023-10-17 DIAGNOSIS — Z923 Personal history of irradiation: Secondary | ICD-10-CM | POA: Diagnosis not present

## 2023-10-17 DIAGNOSIS — C541 Malignant neoplasm of endometrium: Secondary | ICD-10-CM | POA: Insufficient documentation

## 2023-10-17 NOTE — Progress Notes (Signed)
Radiation Oncology Follow up Note  Name: Morgan Dalton   Date:   10/17/2023 MRN:  [id number] DOB: February 16, 1951    This 72 y.o. female presents to the clinic today for 65-month follow-up status post radiation therapy to her pelvis for recurrent endometrial carcinoma.  REFERRING PROVIDER: Lynnea Ferrier, MD  HPI: The patient, a 72 year old with a history of recurrent endometrial carcinoma, presents with new onset pelvic pain. She initially underwent THBSO in 2009 for deeply invasive two thirds well differentiated endometrial adenocarcinoma stage 1C. She has been on letrozole therapy for the past seven weeks. The patient reports a six-week history of pain in the coccyx, upper buttocks, and pelvis. The pain, initially thought to be sciatica, has not radiated down the leg and has been managed with Tylenol and Advil. Over the past week, the pain has worsened, making it difficult to walk and causing discomfort when bending over or sneezing. The patient denies any previous history of back problems..  She specifically denies any increased lower urinary tract symptoms diarrhea.  COMPLICATIONS OF TREATMENT: none  FOLLOW UP COMPLIANCE: keeps appointments   PHYSICAL EXAM:  BP (!) 147/82 Comment: Patient will contact pcp if BP remains elevated  Pulse 75   Temp (!) 97.3 F (36.3 C) (Tympanic)   Resp 16   Wt 184 lb (83.5 kg)   BMI 31.58 kg/m  Range of motion of her lower extremities does not elicit pain.  Deep palpation of her spine does not elicit pain.  Well-developed well-nourished patient in NAD. HEENT reveals PERLA, EOMI, discs not visualized.  Oral cavity is clear. No oral mucosal lesions are identified. Neck is clear without evidence of cervical or supraclavicular adenopathy. Lungs are clear to A&P. Cardiac examination is essentially unremarkable with regular rate and rhythm without murmur rub or thrill. Abdomen is benign with no organomegaly or masses noted. Motor sensory and DTR levels are equal and  symmetric in the upper and lower extremities. Cranial nerves II through XII are grossly intact. Proprioception is intact. No peripheral adenopathy or edema is identified. No motor or sensory levels are noted. Crude visual fields are within normal range.  RADIOLOGY RESULTS: RADIOLOGY Pelvic MRI: Response to therapy of the right pelvic mass as evidenced by decrease in size and development of central necrosis with no new or progressive disease (06/2023) CT scan: No evidence of progressive disease  PLAN: Recurrent Endometrial Carcinoma Patient is 5 months post pelvic radiation therapy for recurrence. She is currently on Letrozole therapy. Recent imaging (MRI in July and CT scan) shows response to therapy with decrease in size of right pelvic mass and development of central necrosis. No new or progressive disease noted. -Continue Letrozole therapy. -Continue monitoring with imaging studies.  New Onset Lower Back Pain Patient reports new onset pain in the coccyx, upper buttocks, and pelvis starting 6 weeks ago. Pain has worsened in the past week, causing difficulty in walking and discomfort when bending over or sneezing. No leg pain reported. No previous history of back problems. Possible disc disease noted on recent CT scan. -Order MRI scan of lumbar spine. -See patient back one week after MRI scan for follow-up.    Carmina Miller, MD

## 2023-10-19 ENCOUNTER — Encounter: Payer: Self-pay | Admitting: Obstetrics and Gynecology

## 2023-10-19 ENCOUNTER — Inpatient Hospital Stay: Payer: Medicare Other | Attending: Obstetrics and Gynecology | Admitting: Obstetrics and Gynecology

## 2023-10-19 VITALS — BP 161/85 | HR 74 | Temp 98.6°F | Resp 19 | Wt 194.5 lb

## 2023-10-19 DIAGNOSIS — Z8542 Personal history of malignant neoplasm of other parts of uterus: Secondary | ICD-10-CM | POA: Diagnosis present

## 2023-10-19 DIAGNOSIS — Z923 Personal history of irradiation: Secondary | ICD-10-CM | POA: Diagnosis not present

## 2023-10-19 DIAGNOSIS — Z90710 Acquired absence of both cervix and uterus: Secondary | ICD-10-CM | POA: Insufficient documentation

## 2023-10-19 DIAGNOSIS — M545 Low back pain, unspecified: Secondary | ICD-10-CM | POA: Diagnosis not present

## 2023-10-19 DIAGNOSIS — Z79899 Other long term (current) drug therapy: Secondary | ICD-10-CM

## 2023-10-19 DIAGNOSIS — Z79811 Long term (current) use of aromatase inhibitors: Secondary | ICD-10-CM | POA: Diagnosis not present

## 2023-10-19 DIAGNOSIS — Z9221 Personal history of antineoplastic chemotherapy: Secondary | ICD-10-CM | POA: Insufficient documentation

## 2023-10-19 DIAGNOSIS — C7989 Secondary malignant neoplasm of other specified sites: Secondary | ICD-10-CM | POA: Insufficient documentation

## 2023-10-19 DIAGNOSIS — Z5181 Encounter for therapeutic drug level monitoring: Secondary | ICD-10-CM

## 2023-10-19 DIAGNOSIS — C541 Malignant neoplasm of endometrium: Secondary | ICD-10-CM

## 2023-10-19 DIAGNOSIS — Z90722 Acquired absence of ovaries, bilateral: Secondary | ICD-10-CM | POA: Diagnosis not present

## 2023-10-19 NOTE — Patient Instructions (Signed)
Please start taking calcium 1200 mg in addition to your vitamin D supplement that you're already taking.

## 2023-10-19 NOTE — Progress Notes (Signed)
Gynecologic Oncology Interval Visit   Referring Provider: Dr. Feliberto Gottron  Chief Complaint: Right pelvic recurrence of grade 1 endometrial cancer Subjective:  Morgan Dalton is a 72 y.o. female who is seen in consultation from Dr. Feliberto Gottron with history of endometrial cancer in 2009 who presents for discussion of imaging for incidental right pelvic mass.   She has started on letrozole July 20, 2023. She has a CT scan on 10/12/2023 - results noted below. She recently saw Dr. Rushie Chestnut and he ordered a MRI lumbar spine to assess low back pain. Given the CT scan we recommended obtaining MRI lumbar/sacral spine.   She has been taking her vitamin D but not her calcium.  She is not consistently using vaginal dilators  CT A/P scan on 10/12/2023 IMPRESSION: 1. Interval decrease in size of right posterior pelvic sidewall soft tissue nodule. 2. New asymmetric sclerosis within the right sacral wing. This is somewhat ill-defined. Findings may be related to insufficiency fracture or less likely osseous metastasis. Consider further evaluation with contrast enhanced MRI of the pelvis. 3. Stable 3 cm staghorn calculus within the inferior pole of the right kidney. 4. Colonic diverticulosis without signs of acute diverticulitis. 5.  Aortic Atherosclerosis (ICD10-I70.0).     Gynecologic History:  She underwent vaginal hysterectomy with Dr Feliberto Gottron at [facility] in 2009. 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 She was referred to Dr. Hyacinth Meeker at [facility] and underwent robotic assisted bilateral salpino-oophorectomy, bilateral pelvic lymph node dissection, supraumbilical laparotomy and periaortic lymph node dissection. Surgery complicated by adhesions. Final pathology revealed a deeply invasive (two thirds), well differentiated endometrioid adenocarcinoma, stage Ic. Washings cervix and adnexa were negative. She elected for adjuvant vaginal brachytherapy at [facility] and  completed treatment 02/02/2008.   Patient went to ER for RUQ abdominal pain. CT 01/21/23 showed diverticulitis at hepatic flexure with some free air. Treated with antibiotics for diverticulitis.  Incidental finding of solid mass of right posterior adnexa measuring 3.2 x 2.2 cm, new since previous CT scan in 2020. She also has a large staghorn calculus in the right kidney.  She was seen by Dr. Feliberto Gottron in [place].   Patient provided [place] with her records from her endometrial cancer which were reviewed in detail, copied, and scanned into her chart.  Last colonoscopy 11/07/2018- report reviewed and showed tortuous colon, diverticulosis, recommendation to repeat in 5 years (2024)  01/30/23- MRI Abdomen and Pelvis w wo contrast S/p hysterectomy. Left ovary not visualized. Solid heterogeneously enhancing right ovarian lesion measuring 3.0 x 1.9 x 2.3 cm. No ascites. No lymphadenopathy. Inflammation of proximal transverse colon consistent with residual diverticulitis.  MRI was addended to reflect that mass was present on CT from 2020 and measured 1.6 x 1.2 cm at that time. No definite communication with the colon or small bowel loops and the appendix is identified as separate from this lesion.   HE4- 142 (elevated) CA 125 - 18 (normal) ROMA- 30.57% (high risk)  01/25/23- Pap- NILM  A PET scan was performed to better ascertain the etiology of the mass. 02/10/23- PET 1. Hypermetabolic solid mass in the RIGHT pelvis consistent with ovarian neoplasm. 2. No evidence of metastatic adenopathy in the pelvis or periaortic retroperitoneum. 3. No evidence of metastatic disease outside the pelvis. 4. Post hysterectomy anatomy. No abnormal metabolic activity associated with the vagina. 5. RIGHT renal staghorn calculus.  Imaging was reviewed by Dr. Amil Amen. Report was addended to reflect that based on her prior surgical hysterectomy and oophorectomy, intensely hypermetabolic solid mass is  favored to be a neoplastic  deposit, potentially from remote endometrial carcinoma.   03/04/23 Image guided biopsy of right pelvic mass. DIAGNOSIS: A. SOFT TISSUE, RIGHT ADNEXAL; CT-GUIDED CORE NEEDLE BIOPSY: - POSITIVE FOR MALIGNANCY. - METASTATIC ADENOCARCINOMA, COMPATIBLE WITH ENDOMETRIOID ADENOCARCINOMA.  Comment: The patient's prior history of endometrioid adenocarcinoma in 2009 is noted.  Biopsy sections display an abnormal cribriform and somewhat papillary glandular proliferation, histologically compatible with the patient's previously diagnosed endometrioid adenocarcinoma. Immunohistochemical studies show tumor cells to be positive for pancytokeratin, PAX8, estrogen receptor (greater than 90%, strong), and progesterone receptor (greater than 90%, strong).  Tumor cells are negative for CK7, WT1, CDX2, and Napsin A.  P53 demonstrates a wild-type (non-mutated) pattern of expression.  Taken together, the morphologic and immunophenotypic findings support the above diagnosis.  Tumor cells are positive for estrogen receptor (greater than 90%,  strong) and progesterone receptor (greater than 90%, strong).  Tumor  cells display wild-type (9 mutated) expression of p53.    DNA Mismatch Repair (MMR)  MLH1: Intact nuclear expression  MSH2: Intact nuclear expression  MSH6: Intact nuclear expression  PMS2: Intact nuclear expression    04/07/2023-05/12/2023 total dose with 50 Gy completed radiation.   06/24/2023 CT A/P Findings: The lesion centered just superior and lateral to the right side of the vaginal cuff measures 2.0 x 1.6 cm on 72/2. It is centrally hypoattenuating today (presumably secondary to necrosis). This makes differentiation from surrounding bowel loops challenging. Decreased in size from 3.2 x 2.2 cm on the prior CT.  07/20/2023 She was counseled about Megace versus letrozole. She opted for letrozole.   We reviewed her last DEXA which was DEXA 08/09/2022 INTERPRETATION:  Osteopenia/Low bone mass. No  significant change in bone mineral density from  08-08-2020.  She is followed by her primary care provider.  With regard to the osteopenia he recommended calcium 300 mg once daily and Vit D 1000U daily.  Plan for next DEXA in 2025  Problem List: Patient Active Problem List   Diagnosis Date Noted   Endometrial cancer (HCC) 03/20/2023   Benign essential hypertension 01/27/2023   Diverticulitis 01/26/2023   Thrombocytosis 11/29/2019   GERD (gastroesophageal reflux disease) 12/31/2016   History of nephrolithiasis 12/31/2016   Hyperlipidemia, unspecified 12/31/2016   Insomnia 12/31/2016   Osteopenia 12/31/2016   Acquired hypothyroidism 10/26/2016   Bladder neoplasm of uncertain malignant potential 09/09/2016   Bladder pain 08/11/2016   Chronic cystitis 08/11/2016   Mild episode of recurrent major depressive disorder (HCC) 07/23/2016   History of endometrial cancer 01/23/2016   Recurrent carcinoma of endometrium (HCC) 01/23/2016   Renal colic 06/19/2014   Right upper quadrant pain 08/07/2012   Kidney stone 08/07/2012   Gross hematuria 08/07/2012   Malignant neoplasm of corpus uteri (HCC) 08/07/2012   Microscopic hematuria 08/07/2012   Urinary tract infection 08/07/2012    Past Medical History: Past Medical History:  Diagnosis Date   Ankle fracture, left    Anxiety    Cancer (HCC)    Uterine Cancer   Dyspepsia    Endometrial cancer (HCC)    GERD (gastroesophageal reflux disease)    History of kidney stones    Hx of dysplastic nevus 10/16/2010   Right lower leg. Severe atypia. Excised: 11/25/2010. 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 Well differentiated. Tx: [facility]  Past Surgical History: Past Surgical History:  Procedure Laterality Date   ABDOMINAL HYSTERECTOMY     CATARACT EXTRACTION W/PHACO Right 12/07/2016   Procedure:  CATARACT EXTRACTION PHACO AND INTRAOCULAR LENS PLACEMENT (IOC);  Surgeon: Galen Manila, MD;  Location: [facility] ORS;  Service: Ophthalmology;  Laterality: Right;  [place] 42.5 AP% 23.2 CDE 9.87 Fluid pack lot # [id number]   CATARACT EXTRACTION W/PHACO Left 01/04/2017   Procedure: CATARACT EXTRACTION PHACO AND INTRAOCULAR LENS PLACEMENT (IOC);  Surgeon: Galen Manila, MD;  Location: [facility] ORS;  Service: Ophthalmology;  Laterality: Left;  [place] 39.2 AP% 23.9 CDE 9.38 Fluid Pack Lot # [id number]   CHOLECYSTECTOMY  2014   COLONOSCOPY  2014   COLONOSCOPY WITH ESOPHAGOGASTRODUODENOSCOPY (EGD)  2004   COLONOSCOPY WITH PROPOFOL N/A 11/07/2018   Procedure: COLONOSCOPY WITH PROPOFOL;  Surgeon: Christena Deem, MD;  Location[facility] ENDOSCOPY;  Service: Endoscopy;  Laterality: N/A;   DILATION AND CURETTAGE OF UTERUS     EXTRACORPOREAL SHOCK WAVE LITHOTRIPSY     EYE SURGERY     GANGLION CYST EXCISION Right    wrist   HALLUX VALGUS LAPIDUS Left 08/11/2022   Procedure: HALLUX VALGUS LAPIDUS;  Surgeon: Gwyneth Revels, DPM;  Location: [facility] ORS;  Service: Podiatry;  Laterality: Left;   KIDNEY STONE SURGERY     several   KNEE ARTHROSCOPY Left 2014   partial medial meniscectomy, medial chondroplasty   NM PET TUMOR IMAGING  02/07/2023   TOTAL ABDOMINAL HYSTERECTOMY W/ BILATERAL SALPINGOOPHORECTOMY  2009    Past Gynecologic History:  G2P2 Post menopausal Menarche: age 46  OB History:  OB History  No obstetric history on file.    Family History: Family History  Problem Relation Age of Onset   Breast cancer Paternal Aunt     Social History: Social History   Socioeconomic History   Marital status: Married    Spouse name: Molly Maduro   Number of children: 2   Years of education: Not on file   Highest education level: Not on file  Occupational History   Not on file  Tobacco Use   Smoking status: Never   Smokeless tobacco: Never  Vaping Use   Vaping status: Never Used  Substance and Sexual  Activity   Alcohol use: No   Drug use: No   Sexual activity: Not on file  Other Topics Concern   Not on file  Social History Narrative   Not on file   Social Determinants of Health   Financial Resource Strain: Low Risk  (10/18/2023)   Received from [facility]   Overall Financial Resource Strain (CARDIA)    Difficulty of Paying Living Expenses: Not hard at all  Food Insecurity: No Food Insecurity (10/18/2023)   Received from [facility]   Hunger Vital Sign    Worried About Running Out of Food in the Last Year: Never true    Ran Out of Food in the Last Year: Never true  Transportation Needs: No Transportation Needs (10/18/2023)   Received from [facility] - Transportation    In the past 12 months, has lack of transportation kept you from medical appointments or from getting medications?: No    Lack of Transportation (Non-Medical): No  Physical Activity: Not on file  Stress: Not on file  Social Connections: Not on file  Intimate Partner Violence: Not At Risk (03/18/2023)   Humiliation, Afraid, Rape, and Kick questionnaire    Fear of Current or Ex-Partner: No  Emotionally Abused: No    Physically Abused: No    Sexually Abused: No    Allergies: Allergies  Allergen Reactions   Statins Other (See Comments)    Myalgia     Current Medications: Current Outpatient Medications  Medication Sig Dispense Refill   ascorbic acid (VITAMIN C) 500 MG tablet Take 500 mg by mouth daily. Patient states she takes it October - April.     aspirin EC 81 MG tablet Take 81 mg by mouth daily.      cholecalciferol (VITAMIN D3) 25 MCG (1000 UNIT) tablet Take 1,000 Units by mouth daily.     Coenzyme Q10 (CO Q-10) 100 MG CAPS Take 100 mg by mouth daily.     Cranberry 500 MG TABS Take by mouth.     cyclobenzaprine (FLEXERIL) 10 MG tablet 1/2 to 1 tablet PO BID PRN muscle spasm     ezetimibe (ZETIA) 10 MG tablet Take 10 mg by mouth daily.      Glucosamine-Chondroitin 750-600 MG TABS Take 1 tablet by mouth daily.     hydrochlorothiazide (HYDRODIURIL) 25 MG tablet Take 12.5 mg by mouth daily.     Krill Oil 1000 MG CAPS Take by mouth.     letrozole (FEMARA) 2.5 MG tablet Take 1 tablet (2.5 mg total) by mouth daily. 90 tablet 0   levothyroxine (SYNTHROID, LEVOTHROID) 25 MCG tablet Take 25 mcg by mouth daily before breakfast.     losartan (COZAAR) 50 MG tablet Take 1 tablet by mouth daily.     Multiple Vitamins-Minerals (WOMENS 50+ MULTI VITAMIN PO) Take by mouth.     pantoprazole (PROTONIX) 40 MG tablet Take 40 mg by mouth every other day.     predniSONE (DELTASONE) 10 MG tablet Take 4 tabs daily for 2 days, then 3 tabs daily for 2 days, then 2 tabs daily for 2 days, then 1 tab daily for 2 days     rosuvastatin (CRESTOR) 5 MG tablet Take 5 mg by mouth once a week. Thursday night     sertraline (ZOLOFT) 50 MG tablet Take 50 mg by mouth daily.     No current facility-administered medications for this visit.   Review of Systems General: no complaints  HEENT: no complaints  Lungs: no complaints  Cardiac: no complaints  GI: no complaints  GU: vaginal spotting minimal after radiation, vaginal spotting, pelvic pain associated with low back/sciatic pain   Musculoskeletal: back pain  Extremities: no complaints  Skin: no complaints  Neuro: no complaints  Endocrine: no complaints  Psych: no complaints       Objective:  Physical Examination:  BP (!) 161/85   Pulse 74   Temp 98.6 F (37 C)   Resp 19   Wt 194 lb 8 oz (88.2 kg)   SpO2 98%   BMI 33.39 kg/m    Performance Status: 1  GENERAL: Patient is a well appearing female in no acute distress HEENT: Atraumatic normocephalic  NODES:  No cervical, supraclavicular, axillary, or inguinal lymphadenopathy palpated.  LUNGS: Normal respiratory effort ABDOMEN:  Soft, nontender.  Nondistended.  No ascites masses or hernias MSK:  No focal spinal tenderness to palpation.   EXTREMITIES:  No peripheral edema.   NEURO:  Nonfocal. Well oriented.  Appropriate affect.  Pelvic: EGBUS: no lesions Cervix: surgically absent.  Vagina: no lesions, no discharge or bleeding.  Vaginal adhesions that were easily lysed with digital exam.  Vaginal narrowing at the midportion on the right Uterus: surgically absent.  BME: no palpable  masses Rectovaginal: confirmatory    Lab Review No labs on site today  Radiologic Imaging: 06/28/2023 CT ABDOMEN AND PELVIS WITH CONTRAST   TECHNIQUE: Multidetector CT imaging of the abdomen and pelvis was performed using the standard protocol following bolus administration of intravenous contrast.   RADIATION DOSE REDUCTION: This exam was performed according to the departmental dose-optimization program which includes automated exposure control, adjustment of the mA and/or kV according to patient size and/or use of iterative reconstruction technique.   CONTRAST:  OMNIPAQUE IOHEXOL 300 MG/ML  SOLN   COMPARISON:  02/10/2023 PET.  Abdominopelvic MRI of 01/30/2023.   FINDINGS: Lower chest: Left lower lobe calcified granuloma. Borderline cardiomegaly.   Hepatobiliary: Suspect mild hepatic steatosis. Mild hepatomegaly at 18.7 cm craniocaudal. Cholecystectomy, without biliary ductal dilatation.   Pancreas: Normal, without mass or ductal dilatation.   Spleen: Normal in size, without focal abnormality.   Adrenals/Urinary Tract: Normal adrenal glands. Normal left kidney for age. Branching, staghorn type calculus again identified within the lower pole right renal collecting system and extending into the right renal pelvis, similar. No significant obstruction. Normal urinary bladder.   Stomach/Bowel: Normal stomach, without wall thickening. Scattered colonic diverticula. Colonic stool burden suggests constipation. Normal terminal ileum. Normal small bowel.   Vascular/Lymphatic: Aortic atherosclerosis. No  abdominopelvic adenopathy.   Reproductive: Hysterectomy. The lesion centered just superior and lateral to the right side of the vaginal cuff measures 2.0 x 1.6 cm on 72/2. It is centrally hypoattenuating today (presumably secondary to necrosis). This makes differentiation from surrounding bowel loops challenging. Decreased in size from 3.2 x 2.2 cm on the prior CT.   No new adnexal mass.   Other: No significant free fluid. No evidence of omental or peritoneal disease.   Musculoskeletal: Osteopenia. Degenerative disc disease at the lumbosacral junction.   IMPRESSION: 1. Response to therapy of right pelvic mass, as evidenced by decrease in size and development of central necrosis. 2. No new or progressive disease. 3. Similar right lower pole staghorn type renal calculus. 4. Hepatomegaly and possible hepatic steatosis. 5.  Aortic Atherosclerosis (ICD10-I70.0).  10/12/2023 CT A/P CT ABDOMEN AND PELVIS WITH CONTRAST   TECHNIQUE: Multidetector CT imaging of the abdomen and pelvis was performed using the standard protocol following bolus administration of intravenous contrast.   RADIATION DOSE REDUCTION: This exam was performed according to the departmental dose-optimization program which includes automated exposure control, adjustment of the mA and/or kV according to patient size and/or use of iterative reconstruction technique.   CONTRAST:  OMNIPAQUE IOHEXOL 300 MG/ML  SOLN   COMPARISON:  06/24/2023   FINDINGS: Lower chest: Calcified nodule is noted in the posteromedial left base. No acute abnormality.   Hepatobiliary: No focal liver abnormality is seen. Status post cholecystectomy. No biliary dilatation.   Pancreas: Unremarkable. No pancreatic ductal dilatation or surrounding inflammatory changes.   Spleen: Normal in size without focal abnormality.   Adrenals/Urinary Tract: Normal adrenal glands. Again seen is a 3 cm staghorn calculus within the inferior pole  of the right kidney. No left renal calculi. No kidney mass or signs of obstructive uropathy. Urinary bladder is unremarkable.   Stomach/Bowel: Stomach appears normal. No bowel wall thickening, inflammation or distension. The appendix is visualized and is within normal limits. Colonic diverticulosis without signs of acute diverticulitis.   Vascular/Lymphatic: Normal caliber of the abdominal aorta. Aortic atherosclerotic calcifications. No abdominopelvic adenopathy.   Reproductive: Status post hysterectomy. Right posterior pelvic sidewall soft tissue nodule is smaller and difficult to identified separate from  adjacent pelvic structures. Best estimate is this measures approximately 7 mm, image 73/2. Previously this measured 2 cm.   Other: No ascites or focal fluid collections. No signs of omental or peritoneal disease.   Musculoskeletal: New asymmetric sclerosis is identified within the right sacral wing, image 57/2. This is somewhat ill-defined.   IMPRESSION: 1. Interval decrease in size of right posterior pelvic sidewall soft tissue nodule. 2. New asymmetric sclerosis within the right sacral wing. This is somewhat ill-defined. Findings may be related to insufficiency fracture or less likely osseous metastasis. Consider further evaluation with contrast enhanced MRI of the pelvis. 3. Stable 3 cm staghorn calculus within the inferior pole of the right kidney. 4. Colonic diverticulosis without signs of acute diverticulitis. 5.  Aortic Atherosclerosis (ICD10-I70.0).      Assessment:  Morgan Dalton is a 72 y.o. female diagnosed with grade 1 (p53wt, ER+, pMMR) deeply invasive endometrioid endometrial cancer in 2009, isolated vaginal recurrence status post radiation therapy with excellent response based on exam and imaging.  Currently on letrozole with excellent response to radiation and adjuvant letrozole.  No palpable disease on exam and 7 mm lesion on CT scan.   Vaginal exam is  notable for severe radiation changes, mild stenosis, and atrophy.   Osteopenia  Low back pain with new asymmetric sclerosis within the right sacral wing, possible insufficiency fracture or less likely osseous metastasis.   Medical co-morbidities complicating care: diverticulitis, hypothyroid.  Plan:   Problem List Items Addressed This Visit       Genitourinary   Endometrial cancer (HCC) - Primary   Relevant Medications   predniSONE (DELTASONE) 10 MG tablet   Other Relevant Orders   MR SACRUM SI JOINTS W WO CONTRAST   CT CHEST ABDOMEN PELVIS W CONTRAST   Other Visit Diagnoses     Low back pain, unspecified back pain laterality, unspecified chronicity, unspecified whether sciatica present       Relevant Medications   predniSONE (DELTASONE) 10 MG tablet   cyclobenzaprine (FLEXERIL) 10 MG tablet   Encounter for monitoring adjuvant hormonal therapy          Continue letrozole 2.5 mg daily. Repeat DEXA in 2025. Recommended she start on calcium. She does have kidney stones. However, we feel that calcium benefit outweigh risks.  She will continue on vitamin D.  Plan on CT scan C/A/P and in person follow-up in 3 months.  She had a PET scan in 01/2023 which was negative for any pulmonary findings.  Low back pain with new asymmetric sclerosis within the right sacral wing, possible insufficiency fracture or less likely osseous metastasis -contrast-enhanced MRI of the lumbosacral spine has been ordered.  We will determine management options based on the results of this test.  The patient's diagnosis, an outline of the further diagnostic and laboratory studies which will be required, the recommendation for surgery, and alternatives were discussed with her.  All questions were answered to their satisfaction.   The patient's diagnosis, an outline of the further diagnostic and laboratory studies which will be required, the recommendation, and alternatives were discussed.  All questions were  answered to the patient's satisfaction.  Zani Kyllonen Leta Jungling, MD

## 2023-10-21 ENCOUNTER — Ambulatory Visit
Admission: RE | Admit: 2023-10-21 | Discharge: 2023-10-21 | Disposition: A | Discharge status: Home / Self Care | Payer: Medicare Other | Source: Ambulatory Visit | Attending: Nurse Practitioner | Admitting: Nurse Practitioner

## 2023-10-21 ENCOUNTER — Ambulatory Visit
Admission: RE | Admit: 2023-10-21 | Discharge: 2023-10-21 | Disposition: A | Discharge status: Home / Self Care | Payer: Medicare Other | Source: Ambulatory Visit | Attending: Radiation Oncology | Admitting: Radiation Oncology

## 2023-10-21 DIAGNOSIS — C541 Malignant neoplasm of endometrium: Secondary | ICD-10-CM

## 2023-10-21 MED ORDER — GADOBUTROL 1 MMOL/ML IV SOLN
8.0000 mL | Freq: Once | INTRAVENOUS | Status: AC | PRN
  Administered 2023-10-21: 8 mL via INTRAVENOUS

## 2023-11-03 ENCOUNTER — Ambulatory Visit
Admission: RE | Admit: 2023-11-03 | Discharge: 2023-11-03 | Disposition: A | Discharge status: Home / Self Care | Payer: Medicare Other | Source: Ambulatory Visit | Attending: Radiation Oncology | Admitting: Radiation Oncology

## 2023-11-03 ENCOUNTER — Encounter: Payer: Self-pay | Admitting: Radiation Oncology

## 2023-11-03 VITALS — BP 145/84 | HR 73 | Temp 97.0°F | Resp 16 | Ht 64.0 in | Wt 193.2 lb

## 2023-11-03 DIAGNOSIS — M4848XA Fatigue fracture of vertebra, sacral and sacrococcygeal region, initial encounter for fracture: Secondary | ICD-10-CM

## 2023-11-03 DIAGNOSIS — C541 Malignant neoplasm of endometrium: Secondary | ICD-10-CM

## 2023-11-03 NOTE — Progress Notes (Signed)
Radiation Oncology Follow up Note  Name: Morgan Dalton   Date:   11/03/2023 MRN:  [id number] DOB: 02/19/1951    This 72 y.o. 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  These are to discuss recent MRI scan showing sacral stress fracture  REFERRING PROVIDER: Lynnea Ferrier, MD  HPI: he patient, a 72 year old female with a history of endometrial adenocarcinoma stage 1C treated with THBSO in 2009 and subsequent radiation therapy to the pelvis six months ago, presents with recent onset of coccyx, upper buttocks, and pelvic pain. She has been on letrozole therapy. An MRI scan of the lumbar spine and sacrum was ordered due to the new onset of pain. The patient reports that the coccyx and buttocks pain has improved, but she now has some low back pain. She has been maintaining a straight posture to alleviate the discomfort. The patient has not had any recent falls or injuries..  COMPLICATIONS OF TREATMENT: none  FOLLOW UP COMPLIANCE: keeps appointments   PHYSICAL EXAM:  BP (!) 145/84   Pulse 73   Temp (!) 97 F (36.1 C)   Resp 16   Ht 5\' 4"  (1.626 m)   Wt 193 lb 3.2 oz (87.6 kg)   BMI 33.16 kg/m  Deep palpation of her spine does not elicit pain range of motion her lower extremities does not elicit pain.  Well-developed well-nourished patient in NAD. HEENT reveals PERLA, EOMI, discs not visualized.  Oral cavity is clear. No oral mucosal lesions are identified. Neck is clear without evidence of cervical or supraclavicular adenopathy. Lungs are clear to A&P. Cardiac examination is essentially unremarkable with regular rate and rhythm without murmur rub or thrill. Abdomen is benign with no organomegaly or masses noted. Motor sensory and DTR levels are equal and symmetric in the upper and lower extremities. Cranial nerves II through XII are  grossly intact. Proprioception is intact. No peripheral adenopathy or edema is identified. No motor or sensory levels are noted. Crude visual fields are within normal range.  RADIOLOGY RESULTS: RADIOLOGY MRI scans reviewed and compatible with above-stated findings Lumbar spine and sacrum MRI: Right sacral ala stress fracture extending past midline at S2 level with substantial surrounding marrow edema. Not characteristic for malignancy.  PLAN: ecurrent Endometrial Carcinoma Status post radiation therapy to pelvis 6 months ago. Currently on Letrozole therapy. No new symptoms suggestive of disease progression. -Continue Letrozole therapy. -Follow up with Dr. Sonia Side in February.  Sacral Stress Fracture Recent onset of coccyx, upper buttocks, and low back pain. MRI confirmed right sacral ala stress fracture with substantial surrounding marrow edema enhancement. Not characteristic for malignancy. -Refer to orthopedic surgeon for further management and rehabilitation. -If no contact from orthopedic surgeon by next week, patient to call.  Follow up in 6 months.    Carmina Miller, MD

## 2023-11-29 ENCOUNTER — Other Ambulatory Visit: Payer: Self-pay

## 2023-11-29 MED ORDER — COMIRNATY 30 MCG/0.3ML IM SUSY
PREFILLED_SYRINGE | INTRAMUSCULAR | 0 refills | Status: DC
  Filled 2023-11-29: qty 0.3, 1d supply, fill #0

## 2023-12-08 ENCOUNTER — Encounter: Payer: Self-pay | Admitting: Nurse Practitioner

## 2023-12-09 ENCOUNTER — Telehealth: Payer: Self-pay | Admitting: *Deleted

## 2023-12-09 DIAGNOSIS — C549 Malignant neoplasm of corpus uteri, unspecified: Secondary | ICD-10-CM

## 2023-12-09 MED ORDER — LETROZOLE 2.5 MG PO TABS: 2.5000 mg | ORAL_TABLET | Freq: Every day | ORAL | 3 refills | Status: DC

## 2023-12-09 NOTE — Telephone Encounter (Signed)
V/o Leotis Shames ,NP- ok to send script for femara

## 2023-12-16 ENCOUNTER — Other Ambulatory Visit: Payer: Self-pay

## 2023-12-16 ENCOUNTER — Encounter
Admission: RE | Disposition: A | Discharge status: Home / Self Care | Payer: Self-pay | Source: Home / Self Care | Attending: Gastroenterology

## 2023-12-16 ENCOUNTER — Ambulatory Visit: Payer: Medicare Other | Admitting: Anesthesiology

## 2023-12-16 ENCOUNTER — Encounter: Payer: Self-pay | Admitting: *Deleted

## 2023-12-16 ENCOUNTER — Ambulatory Visit
Admission: RE | Admit: 2023-12-16 | Discharge: 2023-12-16 | Disposition: A | Discharge status: Home / Self Care | Payer: Medicare Other | Attending: Gastroenterology | Admitting: Gastroenterology

## 2023-12-16 DIAGNOSIS — E7800 Pure hypercholesterolemia, unspecified: Secondary | ICD-10-CM | POA: Diagnosis not present

## 2023-12-16 DIAGNOSIS — N189 Chronic kidney disease, unspecified: Secondary | ICD-10-CM | POA: Insufficient documentation

## 2023-12-16 DIAGNOSIS — Z90710 Acquired absence of both cervix and uterus: Secondary | ICD-10-CM | POA: Insufficient documentation

## 2023-12-16 DIAGNOSIS — Z860100 Personal history of colon polyps, unspecified: Secondary | ICD-10-CM | POA: Insufficient documentation

## 2023-12-16 DIAGNOSIS — Z87442 Personal history of urinary calculi: Secondary | ICD-10-CM | POA: Insufficient documentation

## 2023-12-16 DIAGNOSIS — Z7982 Long term (current) use of aspirin: Secondary | ICD-10-CM | POA: Insufficient documentation

## 2023-12-16 DIAGNOSIS — Z9049 Acquired absence of other specified parts of digestive tract: Secondary | ICD-10-CM | POA: Insufficient documentation

## 2023-12-16 DIAGNOSIS — Z1211 Encounter for screening for malignant neoplasm of colon: Secondary | ICD-10-CM | POA: Diagnosis present

## 2023-12-16 DIAGNOSIS — Z8542 Personal history of malignant neoplasm of other parts of uterus: Secondary | ICD-10-CM | POA: Diagnosis not present

## 2023-12-16 DIAGNOSIS — K5730 Diverticulosis of large intestine without perforation or abscess without bleeding: Secondary | ICD-10-CM | POA: Diagnosis not present

## 2023-12-16 DIAGNOSIS — I129 Hypertensive chronic kidney disease with stage 1 through stage 4 chronic kidney disease, or unspecified chronic kidney disease: Secondary | ICD-10-CM | POA: Diagnosis not present

## 2023-12-16 DIAGNOSIS — Z79890 Hormone replacement therapy (postmenopausal): Secondary | ICD-10-CM | POA: Diagnosis not present

## 2023-12-16 DIAGNOSIS — E039 Hypothyroidism, unspecified: Secondary | ICD-10-CM | POA: Insufficient documentation

## 2023-12-16 DIAGNOSIS — K641 Second degree hemorrhoids: Secondary | ICD-10-CM | POA: Diagnosis not present

## 2023-12-16 HISTORY — PX: COLONOSCOPY WITH PROPOFOL: SHX5780

## 2023-12-16 SURGERY — COLONOSCOPY WITH PROPOFOL
Anesthesia: General

## 2023-12-16 MED ORDER — MIDAZOLAM HCL 2 MG/2ML IJ SOLN
INTRAMUSCULAR | Status: AC
  Filled 2023-12-16: qty 2

## 2023-12-16 MED ORDER — PROPOFOL 1000 MG/100ML IV EMUL
INTRAVENOUS | Status: AC
  Filled 2023-12-16: qty 100

## 2023-12-16 MED ORDER — PROPOFOL 500 MG/50ML IV EMUL
INTRAVENOUS | Status: DC | PRN
  Administered 2023-12-16: 100 ug/kg/min via INTRAVENOUS

## 2023-12-16 MED ORDER — SODIUM CHLORIDE 0.9 % IV SOLN: INTRAVENOUS | Status: DC

## 2023-12-16 MED ORDER — MIDAZOLAM HCL 2 MG/2ML IJ SOLN
INTRAMUSCULAR | Status: DC | PRN
  Administered 2023-12-16 (×2): .5 mg via INTRAVENOUS
  Administered 2023-12-16: 1 mg via INTRAVENOUS

## 2023-12-16 MED ORDER — FENTANYL CITRATE (PF) 100 MCG/2ML IJ SOLN
INTRAMUSCULAR | Status: AC
  Filled 2023-12-16: qty 2

## 2023-12-16 MED ORDER — ONDANSETRON HCL 4 MG/2ML IJ SOLN
INTRAMUSCULAR | Status: DC | PRN
  Administered 2023-12-16: 4 mg via INTRAVENOUS

## 2023-12-16 MED ORDER — FENTANYL CITRATE (PF) 100 MCG/2ML IJ SOLN
INTRAMUSCULAR | Status: DC | PRN
  Administered 2023-12-16: 50 ug via INTRAVENOUS
  Administered 2023-12-16 (×2): 25 ug via INTRAVENOUS

## 2023-12-16 MED ORDER — PROPOFOL 10 MG/ML IV BOLUS
INTRAVENOUS | Status: DC | PRN
  Administered 2023-12-16: 100 mg via INTRAVENOUS

## 2023-12-16 NOTE — H&P (Signed)
 Outpatient short stay form Pre-procedure 12/16/2023  Morgan Dalton Schick, MD  Primary Physician: Morgan Ophelia [name], MD  Reason for visit:  Surveillance  History of present illness:    73 y/o lady with history of hypertension, hypothyroidism, endometrial cancer, and HLD here for surveillance colonoscopy for history of polyps. Last colonoscopy in 2019 was unremarkable. No blood thinners except aspirin. History of hysterectomy and cholecystectomy.    Current Facility-Administered Medications:    0.9 %  sodium chloride  infusion, , Intravenous, Continuous, Morgan Dalton, Morgan ONEIDA, MD, Last Rate: 20 mL/hr at 12/16/23 1249, New Bag at 12/16/23 1249  Medications Prior to Admission  Medication Sig Dispense Refill Last Dose/Taking   ascorbic acid (VITAMIN C) 500 MG tablet Take 500 mg by mouth daily. Patient states she takes it October - April.   Past Week   aspirin EC 81 MG tablet Take 81 mg by mouth daily.    Past Week   cholecalciferol (VITAMIN D3) 25 MCG (1000 UNIT) tablet Take 1,000 Units by mouth daily.   Past Week   Coenzyme Q10 (CO Q-10) 100 MG CAPS Take 100 mg by mouth daily.   Past Week   Cranberry 500 MG TABS Take by mouth.   Past Week   ezetimibe (ZETIA) 10 MG tablet Take 10 mg by mouth daily.   12/15/2023   Glucosamine-Chondroitin 750-600 MG TABS Take 1 tablet by mouth daily.   Past Week   hydrochlorothiazide (HYDRODIURIL) 25 MG tablet Take 12.5 mg by mouth daily.   12/15/2023   Krill Oil 1000 MG CAPS Take by mouth.   Past Week   letrozole  (FEMARA ) 2.5 MG tablet Take 1 tablet (2.5 mg total) by mouth daily. 90 tablet 3 12/15/2023   levothyroxine (SYNTHROID, LEVOTHROID) 25 MCG tablet Take 25 mcg by mouth daily before breakfast.   12/16/2023   losartan (COZAAR) 50 MG tablet Take 1 tablet by mouth daily.   12/15/2023   Multiple Vitamins-Minerals (WOMENS 50+ MULTI VITAMIN PO) Take by mouth.   Past Week   pantoprazole (PROTONIX) 40 MG tablet Take 40 mg by mouth every other day.   12/15/2023   rosuvastatin  (CRESTOR) 5 MG tablet Take 5 mg by mouth once a week. Thursday night   12/15/2023   sertraline (ZOLOFT) 50 MG tablet Take 50 mg by mouth daily.   12/15/2023   COVID-19 mRNA vaccine, Pfizer, (COMIRNATY ) syringe Inject into the muscle. 0.3 mL 0    cyclobenzaprine (FLEXERIL) 10 MG tablet 1/2 to 1 tablet PO BID PRN muscle spasm      predniSONE (DELTASONE) 10 MG tablet Take 4 tabs daily for 2 days, then 3 tabs daily for 2 days, then 2 tabs daily for 2 days, then 1 tab daily for 2 days (Patient not taking: Reported on 12/16/2023)   Not Taking     Allergies  Allergen Reactions   Statins Other (See Comments)    Myalgia      Past Medical History:  Diagnosis Date   Ankle fracture, left    Anxiety    Cancer (HCC)    Uterine Cancer   Dyspepsia    Endometrial cancer (HCC)    GERD (gastroesophageal reflux disease)    History of kidney stones    Hx of dysplastic nevus 10/16/2010   Right lower leg. Severe atypia. Excised: 11/25/2010. Margins free   Hypercholesterolemia    Hyperlipidemia    Hypertension    Hypothyroidism    Kidney stone    Osteopenia    Squamous cell carcinoma  of skin 08/30/2019   Right temple. Well differentiated. Tx: EDC    Review of systems:  Otherwise negative.    Physical Exam  Gen: Alert, oriented. Appears stated age.  HEENT: PERRLA. Lungs: No respiratory distress CV: RRR Abd: soft, benign, no masses Ext: No edema    Planned procedures: Proceed with colonoscopy. The patient understands the nature of the planned procedure, indications, risks, alternatives and potential complications including but not limited to bleeding, infection, perforation, damage to internal organs and possible oversedation/side effects from anesthesia. The patient agrees and gives consent to proceed.  Please refer to procedure notes for findings, recommendations and patient disposition/instructions.     Morgan Dalton Schick, MD [facility]

## 2023-12-16 NOTE — Transfer of Care (Signed)
 Immediate Anesthesia Transfer of Care Note  Patient: Morgan Dalton  Procedure(s) Performed: COLONOSCOPY WITH PROPOFOL   Patient Location: PACU  Anesthesia Type:General  Level of Consciousness: drowsy  Airway & Oxygen Therapy: Patient Spontanous Breathing and Patient connected to nasal cannula oxygen  Post-op Assessment: Report given to RN and Post -op Vital signs reviewed and stable  Post vital signs: Reviewed and stable  Last Vitals:  Vitals Value Taken Time  BP 117/65 12/16/23 1413  Temp 36.1 C 12/16/23 1413  Pulse 81 12/16/23 1414  Resp    SpO2 100 % 12/16/23 1414  Vitals shown include unfiled device data.  Last Pain:  Vitals:   12/16/23 1413  TempSrc: Temporal  PainSc: Asleep         Complications: No notable events documented.

## 2023-12-16 NOTE — Interval H&P Note (Signed)
 History and Physical Interval Note:  12/16/2023 1:19 PM  Morgan Dalton  has presented today for surgery, with the diagnosis of Z86.0101 (ICD-10-CM) - History of adenomatous polyp of colon R19.5 (ICD-10-CM) - Heme + stool.  The various methods of treatment have been discussed with the patient and family. After consideration of risks, benefits and other options for treatment, the patient has consented to  Procedure(s): COLONOSCOPY WITH PROPOFOL  (N/A) as a surgical intervention.  The patient's history has been reviewed, patient examined, no change in status, stable for surgery.  I have reviewed the patient's chart and labs.  Questions were answered to the patient's satisfaction.     Morgan Dalton  Ok to proceed with colonoscopy

## 2023-12-16 NOTE — Op Note (Addendum)
 [facility] Patient Name: Morgan Dalton Procedure Date: 12/16/2023 1:23 PM MRN: [id number] Account #: [id number] Date of Birth: 1951/03/19 Admit Type: Outpatient Age: 73 Room: [facility] ENDO ROOM 3 Gender: Female Note Status: Supervisor Override Instrument Name: Arvis [id number] Procedure:             Colonoscopy Indications:           High risk colon cancer surveillance: Personal history                         of colonic polyps, Last colonoscopy 5 years ago, Heme                         positive stool Providers:             Ole Schick MD, MD Referring MD:          Ophelia Sage, MD (Referring MD) Medicines:             Monitored Anesthesia Care Complications:         No immediate complications. Procedure:             Pre-Anesthesia Assessment:                        - Prior to the procedure, a History and Physical was                         performed, and patient medications and allergies were                         reviewed. The patient is competent. The risks and                         benefits of the procedure and the sedation options and                         risks were discussed with the patient. All questions                         were answered and informed consent was obtained.                         Patient identification and proposed procedure were                         verified by the physician, the nurse, the                         anesthesiologist, the anesthetist and the technician                         in the endoscopy suite. Mental Status Examination:                         alert and oriented. Airway Examination: normal                         oropharyngeal airway and neck mobility. Respiratory  Examination: clear to auscultation. CV Examination:                         normal. Prophylactic Antibiotics: The patient does not                         require prophylactic antibiotics. Prior                          Anticoagulants: The patient has taken no anticoagulant                         or antiplatelet agents. ASA Grade Assessment: II - A                         patient with mild systemic disease. After reviewing                         the risks and benefits, the patient was deemed in                         satisfactory condition to undergo the procedure. The                         anesthesia plan was to use monitored anesthesia care                         (MAC). Immediately prior to administration of                         medications, the patient was re-assessed for adequacy                         to receive sedatives. The heart rate, respiratory                         rate, oxygen saturations, blood pressure, adequacy of                         pulmonary ventilation, and response to care were                         monitored throughout the procedure. The physical                         status of the patient was re-assessed after the                         procedure.                        After obtaining informed consent, the colonoscope was                         passed under direct vision. Throughout the procedure,                         the patient's blood pressure, pulse, and oxygen  saturations were monitored continuously. The                         Colonoscope was introduced through the anus and                         advanced to the the terminal ileum, with                         identification of the appendiceal orifice and IC                         valve. The colonoscopy was technically difficult and                         complex due to restricted mobility of the colon.                         Successful completion of the procedure was aided by                         withdrawing the scope and replacing with the pediatric                         colonoscope. The patient tolerated the procedure well.                         The  quality of the bowel preparation was good. The                         terminal ileum, ileocecal valve, appendiceal orifice,                         and rectum were photographed. Findings:      The perianal and digital rectal examinations were normal.      The terminal ileum appeared normal.      Scattered large-mouthed and small-mouthed diverticula were found in the       sigmoid colon, descending colon, splenic flexure and hepatic flexure.      Internal hemorrhoids were found during retroflexion. The hemorrhoids       were Grade II (internal hemorrhoids that prolapse but reduce       spontaneously).      The exam was otherwise without abnormality on direct and retroflexion       views. Impression:            - The examined portion of the ileum was normal.                        - Diverticulosis in the sigmoid colon, in the                         descending colon, at the splenic flexure and at the                         hepatic flexure.                        - Internal hemorrhoids.                        -  The examination was otherwise normal on direct and                         retroflexion views.                        - No specimens collected. Recommendation:        - Discharge patient to home.                        - Resume previous diet.                        - Continue present medications.                        - Repeat colonoscopy is not recommended due to current                         age (45 years or older) for surveillance.                        - Return to referring physician as previously                         scheduled. Procedure Code(s):     --- Professional ---                        H9894, Colorectal cancer screening; colonoscopy on                         individual at high risk Diagnosis Code(s):     --- Professional ---                        Z86.010, Personal history of colonic polyps                        K64.1, Second degree hemorrhoids                         K57.30, Diverticulosis of large intestine without                         perforation or abscess without bleeding CPT copyright 2022 [employer]. All rights reserved. The codes documented in this report are preliminary and upon coder review may  be revised to meet current compliance requirements. Ole Schick MD, MD 12/16/2023 2:14:33 PM Number of Addenda: 0 Note Initiated On: 12/16/2023 1:23 PM Scope Withdrawal Time: 0 hours 7 minutes 14 seconds  Total Procedure Duration: 0 hours 37 minutes 25 seconds  Estimated Blood Loss:  Estimated blood loss: none.      [facility]

## 2023-12-16 NOTE — Anesthesia Preprocedure Evaluation (Addendum)
 Anesthesia Evaluation  Patient identified by MRN, date of birth, ID band Patient awake    Reviewed: Allergy & Precautions, NPO status , Patient's Chart, lab work & pertinent test results  History of Anesthesia Complications Negative for: history of anesthetic complications  Airway Mallampati: IV   Neck ROM: Full    Dental no notable dental hx.    Pulmonary neg pulmonary ROS   Pulmonary exam normal breath sounds clear to auscultation       Cardiovascular hypertension, Normal cardiovascular exam Rhythm:Regular Rate:Normal  ECG 01/21/23: normal   Neuro/Psych negative neurological ROS     GI/Hepatic ,GERD  ,,  Endo/Other  Hypothyroidism  Obesity   Renal/GU Renal disease (CKD; nephrolithiasis)     Musculoskeletal   Abdominal   Peds  Hematology negative hematology ROS (+)   Anesthesia Other Findings   Reproductive/Obstetrics Uterine and endometrial cancers                             Anesthesia Physical Anesthesia Plan  ASA: 2  Anesthesia Plan: General   Post-op Pain Management:    Induction: Intravenous  PONV Risk Score and Plan: 3 and Propofol  infusion, TIVA and Treatment may vary due to age or medical condition  Airway Management Planned: Natural Airway  Additional Equipment:   Intra-op Plan:   Post-operative Plan:   Informed Consent: I have reviewed the patients History and Physical, chart, labs and discussed the procedure including the risks, benefits and alternatives for the proposed anesthesia with the patient or authorized representative who has indicated his/her understanding and acceptance.       Plan Discussed with: CRNA  Anesthesia Plan Comments: (LMA/GETA backup discussed.  Patient consented for risks of anesthesia including but not limited to:  - adverse reactions to medications - damage to eyes, teeth, lips or other oral mucosa - nerve damage due to  positioning  - sore throat or hoarseness - damage to heart, brain, nerves, lungs, other parts of body or loss of life  Informed patient about role of CRNA in peri- and intra-operative care.  Patient voiced understanding.)       Anesthesia Quick Evaluation

## 2023-12-19 ENCOUNTER — Encounter: Payer: Self-pay | Admitting: Gastroenterology

## 2023-12-19 NOTE — Anesthesia Postprocedure Evaluation (Signed)
 Anesthesia Post Note  Patient: Morgan Dalton  Procedure(s) Performed: COLONOSCOPY WITH PROPOFOL   Patient location during evaluation: PACU Anesthesia Type: General Level of consciousness: awake and alert, oriented and patient cooperative Pain management: pain level controlled Vital Signs Assessment: post-procedure vital signs reviewed and stable Respiratory status: spontaneous breathing, nonlabored ventilation and respiratory function stable Cardiovascular status: blood pressure returned to baseline and stable Postop Assessment: adequate PO intake Anesthetic complications: no   No notable events documented.   Last Vitals:  Vitals:   12/16/23 1413 12/16/23 1433  BP: 117/65 128/79  Pulse:    Resp:    Temp: (!) 36.1 C   SpO2:      Last Pain:  Vitals:   12/17/23 1008  TempSrc:   PainSc: 0-No pain                 Alfonso Ruths

## 2024-01-19 ENCOUNTER — Ambulatory Visit
Admission: RE | Admit: 2024-01-19 | Discharge: 2024-01-19 | Disposition: A | Discharge status: Home / Self Care | Payer: Medicare Other | Source: Ambulatory Visit | Attending: Nurse Practitioner | Admitting: Nurse Practitioner

## 2024-01-19 DIAGNOSIS — C541 Malignant neoplasm of endometrium: Secondary | ICD-10-CM | POA: Insufficient documentation

## 2024-01-19 MED ORDER — IOHEXOL 300 MG/ML  SOLN
100.0000 mL | Freq: Once | INTRAMUSCULAR | Status: AC | PRN
  Administered 2024-01-19: 100 mL via INTRAVENOUS

## 2024-01-20 ENCOUNTER — Encounter: Payer: Self-pay | Admitting: Nurse Practitioner

## 2024-01-26 ENCOUNTER — Other Ambulatory Visit: Payer: Self-pay | Admitting: Internal Medicine

## 2024-01-26 DIAGNOSIS — Z1231 Encounter for screening mammogram for malignant neoplasm of breast: Secondary | ICD-10-CM

## 2024-02-01 ENCOUNTER — Encounter: Payer: Self-pay | Admitting: Obstetrics and Gynecology

## 2024-02-01 ENCOUNTER — Inpatient Hospital Stay: Payer: Medicare Other | Attending: Obstetrics and Gynecology | Admitting: Obstetrics and Gynecology

## 2024-02-01 VITALS — BP 152/68 | HR 70 | Temp 96.6°F | Resp 20 | Wt 189.2 lb

## 2024-02-01 DIAGNOSIS — Z170 Estrogen receptor positive status [ER+]: Secondary | ICD-10-CM | POA: Diagnosis not present

## 2024-02-01 DIAGNOSIS — Z923 Personal history of irradiation: Secondary | ICD-10-CM | POA: Insufficient documentation

## 2024-02-01 DIAGNOSIS — Z8542 Personal history of malignant neoplasm of other parts of uterus: Secondary | ICD-10-CM | POA: Insufficient documentation

## 2024-02-01 DIAGNOSIS — Z90722 Acquired absence of ovaries, bilateral: Secondary | ICD-10-CM | POA: Diagnosis not present

## 2024-02-01 DIAGNOSIS — C541 Malignant neoplasm of endometrium: Secondary | ICD-10-CM

## 2024-02-01 DIAGNOSIS — Z79811 Long term (current) use of aromatase inhibitors: Secondary | ICD-10-CM | POA: Insufficient documentation

## 2024-02-01 DIAGNOSIS — C7989 Secondary malignant neoplasm of other specified sites: Secondary | ICD-10-CM | POA: Insufficient documentation

## 2024-02-01 DIAGNOSIS — Z90710 Acquired absence of both cervix and uterus: Secondary | ICD-10-CM | POA: Diagnosis not present

## 2024-02-01 DIAGNOSIS — M8008XA Age-related osteoporosis with current pathological fracture, vertebra(e), initial encounter for fracture: Secondary | ICD-10-CM | POA: Diagnosis not present

## 2024-02-01 DIAGNOSIS — R911 Solitary pulmonary nodule: Secondary | ICD-10-CM | POA: Insufficient documentation

## 2024-02-01 DIAGNOSIS — Z9079 Acquired absence of other genital organ(s): Secondary | ICD-10-CM | POA: Insufficient documentation

## 2024-02-01 DIAGNOSIS — Z79899 Other long term (current) drug therapy: Secondary | ICD-10-CM

## 2024-02-01 NOTE — Progress Notes (Signed)
Gynecologic Oncology Interval Visit   Referring Provider: Dr. Feliberto Gottron  Chief Complaint: Right pelvic recurrence of grade 1 endometrial cancer Subjective:  Morgan Dalton is a 73 y.o. female who is seen in consultation from Dr. Feliberto Gottron with history of endometrial cancer in 2009, with incidental right pelvic mass in 01/2023, consistent with recurrent disease, s/p radiation, now on letrozole who returns to clinic for follow up.   Treatment Summary:  2009- vaginal hysterectomy with Dr Feliberto Gottron at [facility]. Pathology consistent with endometrial adenocarcinoma 02/02/2008- underwent RA BSO, bilateral pelvic LN dissection, supraumbilical laparotomy, periaortic nodes with Dr Hyacinth Meeker at [facility]. Stage Ic. Elected for adjuvant vaginal brachytherapy 01/21/23- ER for abdominal pain. Solid mass 3.2 x 2.2 07/20/2023- started letrozole  10/12/23- CT - response to therapy. New sclerosis of right sacral wing.  10/21/23- MRI Lumbar & Sacrum - vertical fracture of right sacral ala with edema and enhancement.  12/24 - diagnosed with osteoporosis and started on Prolia.    She has started on letrozole July 20, 2023 which she continues. She is taking her vitamin D but not her calcium.  She has used vaginal dilators and had one episode of vaginal bleeding. She saw Dr. Feliberto Gottron yesterday and he did a well-woman exam including Pap.   She had a CT scan 01/19/2024  CT C/A/P IMPRESSION: 1. Similar size of the right posterior pelvic soft tissue mass which is difficult to definitively assess given its small size and close approximation with the pelvic structures. 2. 3 mm pulmonary nodule in the right lung apex common nonspecific. Suggest attention on short-term interval follow-up chest CT. 3. No evidence of new or progressive disease in the abdomen or pelvis. 4. Left lower pole caliectasis with a 2.1 cm staghorn type calculus. 5. Sacral insufficiency fracture again seen. 6.  Aortic Atherosclerosis  (ICD10-I70.0).   Gynecologic Oncology History:  She underwent vaginal hysterectomy with Dr Feliberto Gottron at [facility] in 2009. 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 She was referred to Dr. Hyacinth Meeker at [facility] and underwent robotic assisted bilateral salpino-oophorectomy, bilateral pelvic lymph node dissection, supraumbilical laparotomy and periaortic lymph node dissection. Surgery complicated by adhesions. Final pathology revealed a deeply invasive (two thirds), well differentiated endometrioid adenocarcinoma, stage Ic. Washings cervix and adnexa were negative. She elected for adjuvant vaginal brachytherapy at [facility] and completed treatment 02/02/2008.   Patient went to ER for RUQ abdominal pain. CT 01/21/23 showed diverticulitis at hepatic flexure with some free air. Treated with antibiotics for diverticulitis.  Incidental finding of solid mass of right posterior adnexa measuring 3.2 x 2.2 cm, new since previous CT scan in 2020. She also has a large staghorn calculus in the right kidney.  She was seen by Dr. Feliberto Gottron in [place].   Patient provided [place] with her records from her endometrial cancer which were reviewed in detail, copied, and scanned into her chart.  Last colonoscopy 11/07/2018- report reviewed and showed tortuous colon, diverticulosis, recommendation to repeat in 5 years (2024)  01/30/23- MRI Abdomen and Pelvis w wo contrast S/p hysterectomy. Left ovary not visualized. Solid heterogeneously enhancing right ovarian lesion measuring 3.0 x 1.9 x 2.3 cm. No ascites. No lymphadenopathy. Inflammation of proximal transverse colon consistent with residual diverticulitis.  MRI was addended to reflect that mass was present on CT from 2020 and measured 1.6 x 1.2 cm at that time. No definite communication with the colon or small bowel loops and the appendix is identified as separate from this lesion.   HE4- 142 (elevated) CA 125 -  18 (normal) ROMA- 30.57% (high  risk)  01/25/23- Pap- NILM  A PET scan was performed to better ascertain the etiology of the mass. 02/10/23- PET 1. Hypermetabolic solid mass in the RIGHT pelvis consistent with ovarian neoplasm. 2. No evidence of metastatic adenopathy in the pelvis or periaortic retroperitoneum. 3. No evidence of metastatic disease outside the pelvis. 4. Post hysterectomy anatomy. No abnormal metabolic activity associated with the vagina. 5. RIGHT renal staghorn calculus.  Imaging was reviewed by Dr. Amil Amen. Report was addended to reflect that based on her prior surgical hysterectomy and oophorectomy, intensely hypermetabolic solid mass is favored to be a neoplastic deposit, potentially from remote endometrial carcinoma.   03/04/23 Image guided biopsy of right pelvic mass. DIAGNOSIS: A. SOFT TISSUE, RIGHT ADNEXAL; CT-GUIDED CORE NEEDLE BIOPSY: - POSITIVE FOR MALIGNANCY. - METASTATIC ADENOCARCINOMA, COMPATIBLE WITH ENDOMETRIOID ADENOCARCINOMA.  Comment: The patient's prior history of endometrioid adenocarcinoma in 2009 is noted.  Biopsy sections display an abnormal cribriform and somewhat papillary glandular proliferation, histologically compatible with the patient's previously diagnosed endometrioid adenocarcinoma. Immunohistochemical studies show tumor cells to be positive for pancytokeratin, PAX8, estrogen receptor (greater than 90%, strong), and progesterone receptor (greater than 90%, strong).  Tumor cells are negative for CK7, WT1, CDX2, and Napsin A.  P53 demonstrates a wild-type (non-mutated) pattern of expression.  Taken together, the morphologic and immunophenotypic findings support the above diagnosis.  Tumor cells are positive for estrogen receptor (greater than 90%,  strong) and progesterone receptor (greater than 90%, strong). Tumor cells display wild-type (9 mutated) expression of p53.   DNA Mismatch Repair (MMR)  MLH1: Intact nuclear expression  MSH2: Intact nuclear expression  MSH6:  Intact nuclear expression  PMS2: Intact nuclear expression    04/07/2023-05/12/2023 total dose with 50 Gy completed radiation.   06/24/2023 CT A/P Findings: The lesion centered just superior and lateral to the right side of the vaginal cuff measures 2.0 x 1.6 cm on 72/2. It is centrally hypoattenuating today (presumably secondary to necrosis). This makes differentiation from surrounding bowel loops challenging. Decreased in size from 3.2 x 2.2 cm on the prior CT. IMPRESSION: 1. Response to therapy of right pelvic mass, as evidenced by decrease in size and development of central necrosis. 2. No new or progressive disease. 3. Similar right lower pole staghorn type renal calculus. 4. Hepatomegaly and possible hepatic steatosis. 5.  Aortic Atherosclerosis (ICD10-I70.0).  07/20/2023 She was counseled about Megace versus letrozole. She opted for letrozole.   We reviewed her last DEXA which was DEXA 08/09/2022 INTERPRETATION:  Osteopenia/Low bone mass. No significant change in bone mineral density from  08-08-2020.  She is followed by her primary care provider.  With regard to the osteopenia he recommended calcium 300 mg once daily and Vit D 1000U daily.  Plan for next DEXA in 2025  10/12/2023 CT A/P CT ABDOMEN AND PELVIS WITH CONTRAST IMPRESSION: 1. Interval decrease in size of right posterior pelvic sidewall soft tissue nodule. 2. New asymmetric sclerosis within the right sacral wing. This is somewhat ill-defined. Findings may be related to insufficiency fracture or less likely osseous metastasis. Consider further evaluation with contrast enhanced MRI of the pelvis. 3. Stable 3 cm staghorn calculus within the inferior pole of the right kidney. 4. Colonic diverticulosis without signs of acute diverticulitis. 5.  Aortic Atherosclerosis (ICD10-I70.0).   11/29/23 - DEXA diagnosed with osteoporosis and started on Prolia.    Problem List: Patient Active Problem List   Diagnosis Date Noted    Endometrial cancer (HCC) 03/20/2023  Benign essential hypertension 01/27/2023   Diverticulitis 01/26/2023   Thrombocytosis 11/29/2019   GERD (gastroesophageal reflux disease) 12/31/2016   History of nephrolithiasis 12/31/2016   Hyperlipidemia, unspecified 12/31/2016   Insomnia 12/31/2016   Osteopenia 12/31/2016   Acquired hypothyroidism 10/26/2016   Bladder neoplasm of uncertain malignant potential 09/09/2016   Bladder pain 08/11/2016   Chronic cystitis 08/11/2016   Mild episode of recurrent major depressive disorder (HCC) 07/23/2016   History of endometrial cancer 01/23/2016   Recurrent carcinoma of endometrium (HCC) 01/23/2016   Renal colic 06/19/2014   Right upper quadrant pain 08/07/2012   Kidney stone 08/07/2012   Gross hematuria 08/07/2012   Malignant neoplasm of corpus uteri (HCC) 08/07/2012   Microscopic hematuria 08/07/2012   Urinary tract infection 08/07/2012    Past Medical History: Past Medical History:  Diagnosis Date   Ankle fracture, left    Anxiety    Cancer (HCC)    Uterine Cancer   Dyspepsia    Endometrial cancer (HCC)    GERD (gastroesophageal reflux disease)    History of kidney stones    Hx of dysplastic nevus 10/16/2010   Right lower leg. Severe atypia. Excised: 11/25/2010. 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 Well differentiated. Tx: [facility]    Past Surgical History: Past Surgical History:  Procedure Laterality Date   ABDOMINAL HYSTERECTOMY     CATARACT EXTRACTION W/PHACO Right 12/07/2016   Procedure: CATARACT EXTRACTION PHACO AND INTRAOCULAR LENS PLACEMENT (IOC);  Surgeon: Galen Manila, MD;  Location: [facility] ORS;  Service: Ophthalmology;  Laterality: Right;  [place] 42.5 AP% 23.2 CDE 9.87 Fluid pack lot # [id number]   CATARACT EXTRACTION W/PHACO Left 01/04/2017   Procedure: CATARACT EXTRACTION PHACO AND  INTRAOCULAR LENS PLACEMENT (IOC);  Surgeon: Galen Manila, MD;  Location: [facility] ORS;  Service: Ophthalmology;  Laterality: Left;  [place] 39.2 AP% 23.9 CDE 9.38 Fluid Pack Lot # [id number]   CHOLECYSTECTOMY  2014   COLONOSCOPY  2014   COLONOSCOPY WITH ESOPHAGOGASTRODUODENOSCOPY (EGD)  2004   COLONOSCOPY WITH PROPOFOL N/A 11/07/2018   Procedure: COLONOSCOPY WITH PROPOFOL;  Surgeon: Christena Deem, MD;  Location: [facility];  Service: Endoscopy;  Laterality: N/A;   COLONOSCOPY WITH PROPOFOL N/A 12/16/2023   Procedure: COLONOSCOPY WITH PROPOFOL;  Surgeon: Regis Bill, MD;  Location: [facility] ENDOSCOPY;  Service: Endoscopy;  Laterality: N/A;   DILATION AND CURETTAGE OF UTERUS     EXTRACORPOREAL SHOCK WAVE LITHOTRIPSY     EYE SURGERY     GANGLION CYST EXCISION Right    wrist   HALLUX VALGUS LAPIDUS Left 08/11/2022   Procedure: HALLUX VALGUS LAPIDUS;  Surgeon: Gwyneth Revels, DPM;  Location: [facility] ORS;  Service: Podiatry;  Laterality: Left;   KIDNEY STONE SURGERY     several   KNEE ARTHROSCOPY Left 2014   partial medial meniscectomy, medial chondroplasty   NM PET TUMOR IMAGING  02/07/2023   TOTAL ABDOMINAL HYSTERECTOMY W/ BILATERAL SALPINGOOPHORECTOMY  2009    Past Gynecologic History:  G2P2 Post menopausal Menarche: age 36  OB History:  OB History  No obstetric history on file.    Family History: Family History  Problem Relation Age of Onset   Breast cancer Paternal Aunt     Social History: Social History   Socioeconomic History   Marital status: Married    Spouse name: Molly Maduro  Number of children: 2   Years of education: Not on file   Highest education level: Not on file  Occupational History   Not on file  Tobacco Use   Smoking status: Never   Smokeless tobacco: Never  Vaping Use   Vaping status: Never Used  Substance and Sexual Activity   Alcohol use: No   Drug use: No   Sexual activity: Not on file  Other Topics Concern   Not on file  Social History  Narrative   Not on file   Social Drivers of Health   Financial Resource Strain: Low Risk  (01/31/2024)   Received from [facility]   Overall Financial Resource Strain (CARDIA)    Difficulty of Paying Living Expenses: Not hard at all  Food Insecurity: No Food Insecurity (01/31/2024)   Received from [facility]   Hunger Vital Sign    Worried About Running Out of Food in the Last Year: Never true    Ran Out of Food in the Last Year: Never true  Transportation Needs: No Transportation Needs (01/31/2024)   Received from [facility] - Transportation    In the past 12 months, has lack of transportation kept you from medical appointments or from getting medications?: No    Lack of Transportation (Non-Medical): No  Physical Activity: Not on file  Stress: Not on file  Social Connections: Not on file  Intimate Partner Violence: Not At Risk (03/18/2023)   Humiliation, Afraid, Rape, and Kick questionnaire    Fear of Current or Ex-Partner: No    Emotionally Abused: No    Physically Abused: No    Sexually Abused: No    Allergies: Allergies  Allergen Reactions   Statins Other (See Comments)    Myalgia     Current Medications: Current Outpatient Medications  Medication Sig Dispense Refill   ascorbic acid (VITAMIN C) 500 MG tablet Take 500 mg by mouth daily. Patient states she takes it October - April.     aspirin EC 81 MG tablet Take 81 mg by mouth daily.      calcium carbonate (OS-CAL - DOSED IN MG OF ELEMENTAL CALCIUM) 1250 (500 Ca) MG tablet Take by mouth.     cholecalciferol (VITAMIN D3) 25 MCG (1000 UNIT) tablet Take 1,000 Units by mouth daily.     Coenzyme Q10 (CO Q-10) 100 MG CAPS Take 100 mg by mouth daily.     Cranberry 500 MG TABS Take by mouth.     ezetimibe (ZETIA) 10 MG tablet Take 10 mg by mouth daily.     Glucosamine-Chondroitin 750-600 MG TABS Take 1 tablet by mouth daily.     hydrochlorothiazide (HYDRODIURIL)  25 MG tablet Take 12.5 mg by mouth daily.     Krill Oil 1000 MG CAPS Take by mouth.     letrozole (FEMARA) 2.5 MG tablet Take 1 tablet (2.5 mg total) by mouth daily. 90 tablet 3   levothyroxine (SYNTHROID, LEVOTHROID) 25 MCG tablet Take 25 mcg by mouth daily before breakfast.     losartan (COZAAR) 50 MG tablet Take 1 tablet by mouth daily.     Multiple Vitamins-Minerals (WOMENS 50+ MULTI VITAMIN PO) Take by mouth.     pantoprazole (PROTONIX) 40 MG tablet Take 40 mg by mouth every other day.     rosuvastatin (CRESTOR) 5 MG tablet Take 5 mg by mouth once a week. Thursday night     sertraline (ZOLOFT) 50 MG tablet Take 50  mg by mouth daily.     COVID-19 mRNA vaccine, Pfizer, (COMIRNATY) syringe Inject into the muscle. 0.3 mL 0   cyclobenzaprine (FLEXERIL) 10 MG tablet 1/2 to 1 tablet PO BID PRN muscle spasm     predniSONE (DELTASONE) 10 MG tablet Take 4 tabs daily for 2 days, then 3 tabs daily for 2 days, then 2 tabs daily for 2 days, then 1 tab daily for 2 days (Patient not taking: Reported on 12/16/2023)     No current facility-administered medications for this visit.   Review of Systems General: no complaints  HEENT: no complaints  Lungs: no complaints  Cardiac: no complaints  GI: no complaints  GU: vaginal spotting minimal after use of dilator x 1.   Musculoskeletal: back pain  Extremities: no complaints  Skin: no complaints  Neuro: no complaints  Endocrine: no complaints  Psych: no complaints       Objective:  Physical Examination:  BP (!) 152/68   Pulse 70   Temp (!) 96.6 F (35.9 C)   Resp 20   Wt 189 lb 3.2 oz (85.8 kg)   SpO2 100%   BMI 32.48 kg/m    Performance Status: 1  GENERAL: Patient is a well appearing female in no acute distress HEENT:  PERRL, neck supple with midline trachea.  NODES:  No cervical, supraclavicular, axillary, or inguinal lymphadenopathy palpated.  LUNGS:  normal respiratory effort ABDOMEN:  Soft, nontender. Nondistended. No masses or  hepatomegaly or ascites.  MSK:  No focal spinal tenderness to palpation.  EXTREMITIES:  No peripheral edema.   NEURO:  Nonfocal. Well oriented.  Appropriate affect.   Pelvic: deferred. Last visit.  EGBUS: no lesions Cervix: surgically absent.  Vagina: no lesions, no discharge or bleeding.  Vaginal adhesions that were easily lysed with digital exam.  Vaginal narrowing at the midportion on the right Uterus: surgically absent.  BME: no palpable masses Rectovaginal: confirmatory    Lab Review No labs on site today  Radiologic Imaging: 01/19/2024 CT C/A/P CLINICAL DATA:  History of cervical cancer, restaging. * Tracking Code: BO *   EXAM: CT CHEST, ABDOMEN, AND PELVIS WITH CONTRAST   TECHNIQUE: Multidetector CT imaging of the chest, abdomen and pelvis was performed following the standard protocol during bolus administration of intravenous contrast.   RADIATION DOSE REDUCTION: This exam was performed according to the departmental dose-optimization program which includes automated exposure control, adjustment of the mA and/or kV according to patient size and/or use of iterative reconstruction technique.   CONTRAST:  OMNIPAQUE IOHEXOL 300 MG/ML  SOLN   COMPARISON:  Multiple priors including CT October 12, 2023 and PET-CT January 30, 2023   FINDINGS: CT CHEST FINDINGS   Cardiovascular: Aortic atherosclerosis. No central pulmonary embolus on this nondedicated study. Normal size heart. Coronary artery calcifications.   Mediastinum/Nodes: No suspicious thyroid nodule. No pathologically enlarged mediastinal, hilar or axillary lymph nodes. The esophagus is grossly unremarkable.   Lungs/Pleura: 3 mm pulmonary nodule in the right lung apex on image 20/4.   Left lower lobe calcified granuloma.   Scattered atelectasis/scarring.   Musculoskeletal: No aggressive lytic or blastic lesion of bone.   CT ABDOMEN PELVIS FINDINGS   Hepatobiliary: No suspicious hepatic  lesion. Gallbladder surgically absent. No biliary ductal dilation.   Pancreas: No pancreatic ductal dilation or evidence of acute inflammation.   Spleen: No splenomegaly.   Adrenals/Urinary Tract: Bilateral adrenal glands appear normal. Left lower pole caliectasis with a 2.1 cm staghorn type calculus. Kidneys demonstrate symmetric enhancement.  Urinary bladder is unremarkable for degree of distension.   Stomach/Bowel: No evidence of acute bowel inflammation or obstruction. Colonic diverticulosis.   Vascular/Lymphatic: Normal caliber abdominal aorta. Smooth IVC contours. The portal, splenic and superior mesenteric veins are patent. No pathologically enlarged abdominal or pelvic lymph nodes.   Reproductive: Uterus is surgically absent.   Right posterior pelvic soft tissue mass is difficult to identify separate from adjacent pelvic structures best estimate is 8 mm area of nodularity on image 109/2 previously measuring 7 mm.   Other: No significant abdominopelvic free fluid. No discrete peritoneal or omental nodularity.   Musculoskeletal: Sacral insufficiency fracture again seen. No aggressive lytic or blastic lesion of bone.   IMPRESSION: 1. Similar size of the right posterior pelvic soft tissue mass which is difficult to definitively assess given its small size and close approximation with the pelvic structures. 2. 3 mm pulmonary nodule in the right lung apex common nonspecific. Suggest attention on short-term interval follow-up chest CT. 3. No evidence of new or progressive disease in the abdomen or pelvis. 4. Left lower pole caliectasis with a 2.1 cm staghorn type calculus. 5. Sacral insufficiency fracture again seen. 6.  Aortic Atherosclerosis (ICD10-I70.0).      Assessment:  AIYANAH KALAMA is a 73 y.o. female diagnosed with grade 1 (p53wt, ER+, pMMR) deeply invasive endometrioid endometrial cancer in 2009, isolated vaginal recurrence status post radiation therapy with  excellent response based on exam and imaging.  Currently on letrozole with excellent response to radiation and adjuvant letrozole.  No palpable disease on exam and 8 mm lesion on CT scan, no evidence of progressive disease.   Vaginal exam previously notable for severe radiation changes, mild stenosis, and atrophy.   Osteoporosis on Prolia  Low back pain with new asymmetric sclerosis within the right sacral wing, insufficiency fracture, symptoms improved.   3 mm pulmonary nodule, continue surveillance  Left lower pole caliectasis with a 2.1 cm staghorn type calculus, asymptomatic  Medical co-morbidities complicating care: diverticulitis, hypothyroid.  Plan:   Problem List Items Addressed This Visit       Genitourinary   Endometrial cancer (HCC) - Primary   Relevant Orders   CT CHEST ABDOMEN PELVIS W CONTRAST   Other Visit Diagnoses       Solitary pulmonary nodule         Encounter for monitoring adjuvant hormonal therapy           Continue letrozole 2.5 mg daily. Defer DEXA follow up to her other providers. Will need repeat in 2026 or 2027. Continue Prolia, calcium and vitamin D.  Plan on CT scan C/A/P and in person follow-up in 3 months.   Low back pain with new asymmetric sclerosis within the right sacral wing, insufficiency fracture, symptoms improved.   3 mm pulmonary nodule, continue surveillance with repeat imaging.   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 Precautions provided.   The patient's diagnosis, an outline of the further diagnostic and laboratory studies which will be required, the recommendation for surgery, and alternatives were discussed with her.  All questions were answered to their satisfaction.   The patient's diagnosis, an outline of the further diagnostic and laboratory studies which will be required, the recommendation, and alternatives were discussed.  All questions were answered to the patient's  satisfaction.  I personally had a face to face interaction and evaluated the patient jointly with the NP, Ms. Consuello Masse.  I have reviewed her history and available records  and have performed the key portions of the physical exam including lymph node survey, abdominal exam, pelvic exam with my findings confirming those documented above by the APP.  I have discussed the case with the APP and the patient.  I agree with the above documentation, assessment and plan which was fully formulated by me.  Counseling was completed by me.   I personally saw the patient and performed a substantive portion of this encounter in conjunction with the listed APP as documented above.  Demeka Sutter Leta Jungling, MD

## 2024-02-02 ENCOUNTER — Encounter: Payer: Self-pay | Admitting: Nurse Practitioner

## 2024-02-10 ENCOUNTER — Telehealth: Payer: Self-pay | Admitting: Nurse Practitioner

## 2024-02-10 NOTE — Telephone Encounter (Signed)
 Called patient to discuss ct results and mychart message.

## 2024-02-29 NOTE — Telephone Encounter (Signed)
 Dr. Feliberto Gottron did a well-woman exam including Pap. Per Durene Romans she confirmed the Pap was negative.  Shella Lahman Leta Jungling, MD

## 2024-03-12 ENCOUNTER — Ambulatory Visit
Admission: RE | Admit: 2024-03-12 | Discharge: 2024-03-12 | Disposition: A | Discharge status: Home / Self Care | Payer: Medicare Other | Source: Ambulatory Visit | Attending: Internal Medicine | Admitting: Internal Medicine

## 2024-03-12 DIAGNOSIS — Z1231 Encounter for screening mammogram for malignant neoplasm of breast: Secondary | ICD-10-CM | POA: Diagnosis present

## 2024-03-26 ENCOUNTER — Encounter: Payer: Self-pay | Admitting: Urology

## 2024-04-23 DIAGNOSIS — R7303 Prediabetes: Secondary | ICD-10-CM | POA: Insufficient documentation

## 2024-04-30 ENCOUNTER — Ambulatory Visit
Admission: RE | Admit: 2024-04-30 | Discharge: 2024-04-30 | Disposition: A | Discharge status: Home / Self Care | Payer: Medicare Other | Source: Ambulatory Visit | Attending: Obstetrics and Gynecology | Admitting: Obstetrics and Gynecology

## 2024-04-30 DIAGNOSIS — C541 Malignant neoplasm of endometrium: Secondary | ICD-10-CM | POA: Diagnosis present

## 2024-04-30 MED ORDER — IOHEXOL 300 MG/ML  SOLN
100.0000 mL | Freq: Once | INTRAMUSCULAR | Status: AC | PRN
  Administered 2024-04-30: 100 mL via INTRAVENOUS

## 2024-05-03 ENCOUNTER — Ambulatory Visit
Admission: RE | Admit: 2024-05-03 | Discharge: 2024-05-03 | Disposition: A | Discharge status: Home / Self Care | Payer: Medicare Other | Source: Ambulatory Visit | Attending: Radiation Oncology | Admitting: Radiation Oncology

## 2024-05-03 ENCOUNTER — Encounter: Payer: Self-pay | Admitting: Radiation Oncology

## 2024-05-03 VITALS — BP 140/76 | HR 65 | Temp 97.6°F | Resp 16 | Ht 64.0 in | Wt 201.0 lb

## 2024-05-03 DIAGNOSIS — Z79811 Long term (current) use of aromatase inhibitors: Secondary | ICD-10-CM | POA: Insufficient documentation

## 2024-05-03 DIAGNOSIS — R918 Other nonspecific abnormal finding of lung field: Secondary | ICD-10-CM | POA: Diagnosis not present

## 2024-05-03 DIAGNOSIS — Z923 Personal history of irradiation: Secondary | ICD-10-CM | POA: Diagnosis not present

## 2024-05-03 DIAGNOSIS — Z90710 Acquired absence of both cervix and uterus: Secondary | ICD-10-CM | POA: Insufficient documentation

## 2024-05-03 DIAGNOSIS — C541 Malignant neoplasm of endometrium: Secondary | ICD-10-CM | POA: Diagnosis present

## 2024-05-03 DIAGNOSIS — Z90722 Acquired absence of ovaries, bilateral: Secondary | ICD-10-CM | POA: Insufficient documentation

## 2024-05-03 NOTE — Progress Notes (Signed)
 Radiation Oncology Follow up Note  Name: Morgan Dalton   Date:   05/03/2024 MRN:  [id number] DOB: [birth year]    This 73 y.o. 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  She developed pelvic recurrence status post salvage radiation therapy.  REFERRING PROVIDER: Melchor Spoon, MD  HPI: Patient is a 73 year old female now out 6 months having completed salvage radiation therapy for right pelvic recurrence of grade 1 endometrial carcinoma and patient status post TAH/BSO back in 2009.Aaron Aas  She was having some pelvic pain over the that is improved.  She continues to have some frequent stools with rectal urgency.  She had a recent CT scan showing no well-defined residual pelvic mass or any progressive disease.  As well as a 3 mm pulmonary nodule in the right lung apex nonspecific.  No evidence of progressive disease in the abdomen or pelvis.  She does have a sacral insufficiency fracture again seen.  She is currently on letrozole  tolerating that well.  She is seeing Dr. Randalyn Bushman next week for pelvic examination.  COMPLICATIONS OF TREATMENT: none  FOLLOW UP COMPLIANCE: keeps appointments   PHYSICAL EXAM:  BP (!) 140/76   Pulse 65   Temp 97.6 F (36.4 C) (Tympanic)   Resp 16   Ht 5\' 4"  (1.626 m)   Wt 201 lb (91.2 kg)   BMI 34.50 kg/m  Well-developed well-nourished patient in NAD. HEENT reveals PERLA, EOMI, discs not visualized.  Oral cavity is clear. No oral mucosal lesions are identified. Neck is clear without evidence of cervical or supraclavicular adenopathy. Lungs are clear to A&P. Cardiac examination is essentially unremarkable with regular rate and rhythm without murmur rub or thrill. Abdomen is benign with no organomegaly or masses noted. Motor sensory and DTR levels are equal and symmetric in the upper and lower extremities. Cranial nerves II through XII are  grossly intact. Proprioception is intact. No peripheral adenopathy or edema is identified. No motor or sensory levels are noted. Crude visual fields are within normal range.  RADIOLOGY RESULTS: CT scan reviewed compatible with above-stated findings  PLAN: Present time patient is doing well very low side effect profile.  I have suggested activity of probiotics to try to regulate some of her bowel movements.  Otherwise I have asked to see her back in 6 months for follow-up.  Patient will see Dr. Randalyn Bushman for pelvic exam and evaluation next week.  Patient knows to call with any concerns.  I would like to take this opportunity to thank you for allowing me to participate in the care of your patient.Glenis Langdon, MD

## 2024-05-09 ENCOUNTER — Ambulatory Visit
Admission: RE | Admit: 2024-05-09 | Discharge: 2024-05-09 | Disposition: A | Discharge status: Home / Self Care | Source: Ambulatory Visit | Attending: Nurse Practitioner | Admitting: Nurse Practitioner

## 2024-05-09 ENCOUNTER — Inpatient Hospital Stay: Payer: Medicare Other | Attending: Obstetrics and Gynecology | Admitting: Obstetrics and Gynecology

## 2024-05-09 ENCOUNTER — Ambulatory Visit: Payer: Self-pay | Admitting: Nurse Practitioner

## 2024-05-09 VITALS — BP 149/73 | HR 72 | Temp 98.6°F | Resp 20 | Wt 203.9 lb

## 2024-05-09 DIAGNOSIS — Z923 Personal history of irradiation: Secondary | ICD-10-CM | POA: Insufficient documentation

## 2024-05-09 DIAGNOSIS — C541 Malignant neoplasm of endometrium: Secondary | ICD-10-CM | POA: Insufficient documentation

## 2024-05-09 DIAGNOSIS — C7989 Secondary malignant neoplasm of other specified sites: Secondary | ICD-10-CM | POA: Diagnosis present

## 2024-05-09 DIAGNOSIS — Z90710 Acquired absence of both cervix and uterus: Secondary | ICD-10-CM | POA: Diagnosis not present

## 2024-05-09 DIAGNOSIS — Z7983 Long term (current) use of bisphosphonates: Secondary | ICD-10-CM | POA: Insufficient documentation

## 2024-05-09 DIAGNOSIS — R600 Localized edema: Secondary | ICD-10-CM | POA: Diagnosis present

## 2024-05-09 DIAGNOSIS — Z8542 Personal history of malignant neoplasm of other parts of uterus: Secondary | ICD-10-CM | POA: Diagnosis present

## 2024-05-09 DIAGNOSIS — Z5181 Encounter for therapeutic drug level monitoring: Secondary | ICD-10-CM

## 2024-05-09 DIAGNOSIS — Z79811 Long term (current) use of aromatase inhibitors: Secondary | ICD-10-CM | POA: Diagnosis not present

## 2024-05-09 DIAGNOSIS — Z90722 Acquired absence of ovaries, bilateral: Secondary | ICD-10-CM | POA: Insufficient documentation

## 2024-05-09 DIAGNOSIS — Z9079 Acquired absence of other genital organ(s): Secondary | ICD-10-CM | POA: Insufficient documentation

## 2024-05-09 DIAGNOSIS — R911 Solitary pulmonary nodule: Secondary | ICD-10-CM | POA: Insufficient documentation

## 2024-05-09 DIAGNOSIS — M8008XA Age-related osteoporosis with current pathological fracture, vertebra(e), initial encounter for fracture: Secondary | ICD-10-CM | POA: Diagnosis not present

## 2024-05-09 NOTE — Telephone Encounter (Signed)
 Called patient with results of ultrasound which was negative for dvt. Recommend ref to vascular.

## 2024-05-09 NOTE — Progress Notes (Signed)
 Gynecologic Oncology Interval Visit   Referring Provider: Dr. Madelene Schanz  Chief Complaint: Right pelvic recurrence of grade 1 endometrial cancer Subjective:  Morgan Dalton is a 73 y.o. female who is seen in consultation from Dr. Madelene Schanz with history of endometrial cancer in 2009, with incidental right pelvic mass in 01/2023, consistent with recurrent disease, s/p radiation, now on letrozole  who returns to clinic for follow up.   Treatment Summary:  2009- vaginal hysterectomy with Dr Madelene Schanz at [facility]. Pathology consistent with endometrial adenocarcinoma 02/02/2008- underwent RA BSO, bilateral pelvic LN dissection, supraumbilical laparotomy, periaortic nodes with Dr Annabell Key at [facility]. Stage Ic. Elected for adjuvant vaginal brachytherapy 01/21/23- ER for abdominal pain. Solid mass 3.2 x 2.2 04/07/2023-05/12/2023 - total dose with 50 Gy completed radiation. 07/20/2023- started letrozole   10/12/23- CT - response to therapy. New sclerosis of right sacral wing.  10/21/23- MRI Lumbar & Sacrum - vertical fracture of right sacral ala with edema and enhancement.  12/24 - diagnosed with osteoporosis and started on Prolia.   04/30/24- CT C/A/P w/ contrast IMPRESSION: 1. Status post hysterectomy. No well-defined residual pelvic mass. No new or progressive disease. 2. Similar right apical 3 mm ground-glass nodule, nonspecific but of doubtful clinical significance. 3. Incidental findings, including: Coronary artery atherosclerosis. Aortic Atherosclerosis (ICD10-I70.0). Right-sided lower pole staghorn type calculus causing similar mild caliectasis. 4. Possible bladder wall thickening. Correlate with symptoms of cystitis.  She continues letrozole . She saw Dr Jacalyn Martin on 05/03/24. She has some minimal vaginal spotting, twice in March. Complains of aches and pains of joints. Has noticed increased edema of left lower extremity. Scheduled to see ortho later today.   Last colonoscopy - 12/16/23 Last mammogram  03/12/24   Gynecologic Oncology History:  She underwent vaginal hysterectomy with Dr Madelene Schanz at [facility] in 2009. 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 She was referred to Dr. Annabell Key at [facility] and underwent robotic assisted bilateral salpino-oophorectomy, bilateral pelvic lymph node dissection, supraumbilical laparotomy and periaortic lymph node dissection. Surgery complicated by adhesions. Final pathology revealed a deeply invasive (two thirds), well differentiated endometrioid adenocarcinoma, stage Ic. Washings cervix and adnexa were negative. She elected for adjuvant vaginal brachytherapy at [facility] and completed treatment 02/02/2008.   Patient went to ER for RUQ abdominal pain. CT 01/21/23 showed diverticulitis at hepatic flexure with some free air. Treated with antibiotics for diverticulitis.  Incidental finding of solid mass of right posterior adnexa measuring 3.2 x 2.2 cm, new since previous CT scan in 2020. She also has a large staghorn calculus in the right kidney.  She was seen by Dr. Madelene Schanz in [place].   Patient provided us  with her records from her endometrial cancer which were reviewed in detail, copied, and scanned into her chart.  Last colonoscopy 11/07/2018- report reviewed and showed tortuous colon, diverticulosis, recommendation to repeat in 5 years (2024)  01/30/23- MRI Abdomen and Pelvis w wo contrast S/p hysterectomy. Left ovary not visualized. Solid heterogeneously enhancing right ovarian lesion measuring 3.0 x 1.9 x 2.3 cm. No ascites. No lymphadenopathy. Inflammation of proximal transverse colon consistent with residual diverticulitis.  MRI was addended to reflect that mass was present on CT from 2020 and measured 1.6 x 1.2 cm at that time. No definite communication with the colon or small bowel loops and the appendix is identified as separate from this lesion.   HE4- 142 (elevated) CA 125 - 18 (normal) ROMA- 30.57% (high risk)  01/25/23-  Pap- NILM  A PET scan was performed to better ascertain the etiology  of the mass.  02/10/23- PET 1. Hypermetabolic solid mass in the RIGHT pelvis consistent with ovarian neoplasm. 2. No evidence of metastatic adenopathy in the pelvis or periaortic retroperitoneum. 3. No evidence of metastatic disease outside the pelvis. 4. Post hysterectomy anatomy. No abnormal metabolic activity associated with the vagina. 5. RIGHT renal staghorn calculus.  Imaging was reviewed by Dr. Dortha Gauss. Report was addended to reflect that based on her prior surgical hysterectomy and oophorectomy, intensely hypermetabolic solid mass is favored to be a neoplastic deposit, potentially from remote endometrial carcinoma.   03/04/23 Image guided biopsy of right pelvic mass. DIAGNOSIS: A. SOFT TISSUE, RIGHT ADNEXAL; CT-GUIDED CORE NEEDLE BIOPSY: - POSITIVE FOR MALIGNANCY. - METASTATIC ADENOCARCINOMA, COMPATIBLE WITH ENDOMETRIOID ADENOCARCINOMA.  Comment: The patient's prior history of endometrioid adenocarcinoma in 2009 is noted.  Biopsy sections display an abnormal cribriform and somewhat papillary glandular proliferation, histologically compatible with the patient's previously diagnosed endometrioid adenocarcinoma.  Immunohistochemical studies show tumor cells to be positive for pancytokeratin, PAX8, estrogen receptor (greater than 90%, strong), and progesterone receptor (greater than 90%, strong).  Tumor cells are negative for CK7, WT1, CDX2, and Napsin A.  P53 demonstrates a wild-type (non-mutated) pattern of expression.  Taken together, the morphologic and immunophenotypic findings support the above diagnosis.  Tumor cells are positive for estrogen receptor (greater than 90%,  strong) and progesterone receptor (greater than 90%, strong). Tumor cells display wild-type (9 mutated) expression of p53.   DNA Mismatch Repair (MMR)  MLH1: Intact nuclear expression  MSH2: Intact nuclear expression  MSH6: Intact nuclear  expression  PMS2: Intact nuclear expression   04/07/2023-05/12/2023 total dose with 50 Gy completed radiation.   06/24/2023 CT A/P Findings: The lesion centered just superior and lateral to the right side of the vaginal cuff measures 2.0 x 1.6 cm on 72/2. It is centrally hypoattenuating today (presumably secondary to necrosis). This makes differentiation from surrounding bowel loops challenging. Decreased in size from 3.2 x 2.2 cm on the prior CT. IMPRESSION: 1. Response to therapy of right pelvic mass, as evidenced by decrease in size and development of central necrosis. 2. No new or progressive disease. 3. Similar right lower pole staghorn type renal calculus. 4. Hepatomegaly and possible hepatic steatosis. 5.  Aortic Atherosclerosis (ICD10-I70.0).  07/20/2023 She was counseled about Megace versus letrozole . She opted for letrozole .   We reviewed her last DEXA which was DEXA 08/09/2022 INTERPRETATION:  Osteopenia/Low bone mass. No significant change in bone mineral density from  08-08-2020.   She is followed by her primary care provider.  With regard to the osteopenia he recommended calcium 300 mg once daily and Vit D 1000U daily.    10/12/2023 CT A/P CT ABDOMEN AND PELVIS WITH CONTRAST IMPRESSION: 1. Interval decrease in size of right posterior pelvic sidewall soft tissue nodule. 2. New asymmetric sclerosis within the right sacral wing. This is somewhat ill-defined. Findings may be related to insufficiency fracture or less likely osseous metastasis. Consider further evaluation with contrast enhanced MRI of the pelvis. 3. Stable 3 cm staghorn calculus within the inferior pole of the right kidney. 4. Colonic diverticulosis without signs of acute diverticulitis. 5.  Aortic Atherosclerosis (ICD10-I70.0).  11/29/23 - DEXA diagnosed with osteoporosis and started on Prolia.   CT scan 01/19/2024  CT C/A/P IMPRESSION: 1. Similar size of the right posterior pelvic soft tissue mass which is difficult  to definitively assess given its small size and close approximation with the pelvic structures. 2. 3 mm pulmonary nodule in the right lung apex common  nonspecific. Suggest attention on short-term interval follow-up chest CT. 3. No evidence of new or progressive disease in the abdomen or pelvis. 4. Left lower pole caliectasis with a 2.1 cm staghorn type calculus. 5. Sacral insufficiency fracture again seen. 6.  Aortic Atherosclerosis (ICD10-I70.0).   Problem List: Patient Active Problem List   Diagnosis Date Noted   Prediabetes 04/23/2024   Endometrial cancer (HCC) 03/20/2023   Benign essential hypertension 01/27/2023   Diverticulitis 01/26/2023   Thrombocytosis 11/29/2019   GERD (gastroesophageal reflux disease) 12/31/2016   History of nephrolithiasis 12/31/2016   Hyperlipidemia, unspecified 12/31/2016   Insomnia 12/31/2016   Osteopenia 12/31/2016   Acquired hypothyroidism 10/26/2016   Bladder neoplasm of uncertain malignant potential 09/09/2016   Bladder pain 08/11/2016   Chronic cystitis 08/11/2016   Mild episode of recurrent major depressive disorder (HCC) 07/23/2016   History of endometrial cancer 01/23/2016   Recurrent carcinoma of endometrium (HCC) 01/23/2016   Renal colic 06/19/2014   Right upper quadrant pain 08/07/2012   Kidney stone 08/07/2012   Gross hematuria 08/07/2012   Malignant neoplasm of corpus uteri (HCC) 08/07/2012   Microscopic hematuria 08/07/2012   Urinary tract infection 08/07/2012    Past Medical History: Past Medical History:  Diagnosis Date   Ankle fracture, left    Anxiety    Cancer (HCC)    Uterine Cancer   Dyspepsia    Endometrial cancer (HCC)    GERD (gastroesophageal reflux disease)    History of kidney stones    Hx of dysplastic nevus 10/16/2010   Right lower leg. Severe atypia. Excised: 11/25/2010. Margins free   Hypercholesterolemia    Hyperlipidemia    Hypertension    Hypothyroidism    Kidney stone    Osteopenia    Squamous  cell carcinoma of skin 08/30/2019   Right temple. Well differentiated. Tx: [facility]    Past Surgical History: Past Surgical History:  Procedure Laterality Date   ABDOMINAL HYSTERECTOMY     CATARACT EXTRACTION W/PHACO Right 12/07/2016   Procedure: CATARACT EXTRACTION PHACO AND INTRAOCULAR LENS PLACEMENT (IOC);  Surgeon: Clair Crews, MD;  Location: [facility] ORS;  Service: Ophthalmology;  Laterality: Right;  US  42.5 AP% 23.2 CDE 9.87 Fluid pack lot # [id number]   CATARACT EXTRACTION W/PHACO Left 01/04/2017   Procedure: CATARACT EXTRACTION PHACO AND INTRAOCULAR LENS PLACEMENT (IOC);  Surgeon: Clair Crews, MD;  Location: [facility] ORS;  Service: Ophthalmology;  Laterality: Left;  US  39.2 AP% 23.9 CDE 9.38 Fluid Pack Lot # [id number]   CHOLECYSTECTOMY  2014   COLONOSCOPY  2014   COLONOSCOPY WITH ESOPHAGOGASTRODUODENOSCOPY (EGD)  2004   COLONOSCOPY WITH PROPOFOL  N/A 11/07/2018   Procedure: COLONOSCOPY WITH PROPOFOL ;  Surgeon: Deveron Fly, MD;  Location: [facility] ENDOSCOPY;  Service: Endoscopy;  Laterality: N/A;   COLONOSCOPY WITH PROPOFOL  N/A 12/16/2023   Procedure: COLONOSCOPY WITH PROPOFOL ;  Surgeon: Shane Darling, MD;  Location: [facility] ENDOSCOPY;  Service: Endoscopy;  Laterality: N/A;   DILATION AND CURETTAGE OF UTERUS     EXTRACORPOREAL SHOCK WAVE LITHOTRIPSY     EYE SURGERY     GANGLION CYST EXCISION Right    wrist   HALLUX VALGUS LAPIDUS Left 08/11/2022   Procedure: HALLUX VALGUS LAPIDUS;  Surgeon: Anell Baptist, DPM;  Location: [facility] ORS;  Service: Podiatry;  Laterality: Left;   KIDNEY STONE SURGERY     several   KNEE ARTHROSCOPY Left 2014   partial medial meniscectomy, medial chondroplasty   NM PET TUMOR IMAGING  02/07/2023   TOTAL ABDOMINAL HYSTERECTOMY W/ BILATERAL SALPINGOOPHORECTOMY  2009    Past Gynecologic History:  G2P2 Post menopausal Menarche: age 8  OB History:  OB History  No obstetric history on file.    Family History: Family History  Problem Relation  Age of Onset   Breast cancer Paternal Aunt     Social History: Social History   Socioeconomic History   Marital status: Married    Spouse name: Porfirio Bristol   Number of children: 2   Years of education: Not on file   Highest education level: Not on file  Occupational History   Not on file  Tobacco Use   Smoking status: Never   Smokeless tobacco: Never  Vaping Use   Vaping status: Never Used  Substance and Sexual Activity   Alcohol use: No   Drug use: No   Sexual activity: Not on file  Other Topics Concern   Not on file  Social History Narrative   Not on file   Social Drivers of Health   Financial Resource Strain: Low Risk  (01/31/2024)   Received from [facility]   Overall Financial Resource Strain (CARDIA)    Difficulty of Paying Living Expenses: Not hard at all  Food Insecurity: No Food Insecurity (01/31/2024)   Received from [facility]   Hunger Vital Sign    Worried About Running Out of Food in the Last Year: Never true    Ran Out of Food in the Last Year: Never true  Transportation Needs: No Transportation Needs (01/31/2024)   Received from [facility] - Transportation    In the past 12 months, has lack of transportation kept you from medical appointments or from getting medications?: No    Lack of Transportation (Non-Medical): No  Physical Activity: Not on file  Stress: Not on file  Social Connections: Not on file  Intimate Partner Violence: Not At Risk (03/18/2023)   Humiliation, Afraid, Rape, and Kick questionnaire    Fear of Current or Ex-Partner: No    Emotionally Abused: No    Physically Abused: No    Sexually Abused: No    Allergies: Allergies  Allergen Reactions   Statins Other (See Comments)    Myalgia     Current Medications: Current Outpatient Medications  Medication Sig Dispense Refill   ascorbic acid (VITAMIN C) 500 MG tablet Take 500 mg by mouth daily. Patient states she takes it  October - April.     aspirin EC 81 MG tablet Take 81 mg by mouth daily.      calcium carbonate (OS-CAL - DOSED IN MG OF ELEMENTAL CALCIUM) 1250 (500 Ca) MG tablet Take by mouth.     cholecalciferol (VITAMIN D3) 25 MCG (1000 UNIT) tablet Take 1,000 Units by mouth daily.     Coenzyme Q10 (CO Q-10) 100 MG CAPS Take 100 mg by mouth daily.     Cranberry 500 MG TABS Take by mouth.     ezetimibe (ZETIA) 10 MG tablet Take 10 mg by mouth daily.     Glucosamine-Chondroitin 750-600 MG TABS Take 1 tablet by mouth daily.     hydrochlorothiazide (HYDRODIURIL) 25 MG tablet Take 12.5 mg by mouth daily.     Krill Oil 1000 MG CAPS Take by mouth.     letrozole  (FEMARA ) 2.5 MG tablet Take 1 tablet (2.5 mg total) by mouth daily. 90 tablet 3   levothyroxine (SYNTHROID, LEVOTHROID) 25 MCG tablet Take 25 mcg by mouth daily before breakfast.     losartan (COZAAR)  50 MG tablet Take 1 tablet by mouth daily.     Multiple Vitamins-Minerals (WOMENS 50+ MULTI VITAMIN PO) Take by mouth.     pantoprazole (PROTONIX) 40 MG tablet Take 40 mg by mouth every other day.     rosuvastatin (CRESTOR) 5 MG tablet Take 5 mg by mouth once a week. Thursday night     sertraline (ZOLOFT) 50 MG tablet Take 50 mg by mouth daily.     No current facility-administered medications for this visit.   Review of Systems General:  no complaints Skin: no complaints Eyes: no complaints HEENT: no complaints Breasts: no complaints Pulmonary: no complaints Cardiac: no complaints Gastrointestinal: no complaints Genitourinary/Sexual: no complaints Ob/Gyn: vaginal spotting x 2  Musculoskeletal: right hand aching- worse in morning; left leg edema Hematology: no complaints Neurologic/Psych: no complaints   Objective:  Physical Examination:  BP (!) 149/73   Pulse 72   Temp 98.6 F (37 C)   Resp 20   Wt 203 lb 14.4 oz (92.5 kg)   SpO2 100%   BMI 35.00 kg/m    Performance Status: 1  GENERAL: Patient is a well appearing female in no acute  distress HEENT:  PERRL, neck supple with midline trachea.  NODES:  No cervical, supraclavicular, axillary, or inguinal lymphadenopathy palpated.  LUNGS:  normal respiratory effort ABDOMEN:  Soft, nontender. Nondistended. No masses or hepatomegaly or ascites.  MSK:  No focal spinal tenderness to palpation.  EXTREMITIES:  Left leg > right. Generalized edema. Increased fullness from hip. No associated adenopathy. No redness. Nontender.  NEURO:  Nonfocal. Well oriented.  Appropriate affect.  Pelvic: Exam chaperoned by CMA  EGBUS: no lesions Cervix: surgically absent.  Vagina: no lesions, no discharge.  Severe vaginal agglutination and atrophy.  Small amount of oozing noted with speculum insertion at the upper vaginal apex.  However no gross lesions present. Vaginal narrowing has improved significantly with dilation therapy.  Patient on the vaginal cylinder is only approximately 4 cm long. Uterus: surgically absent.  BME: no palpable masses Rectovaginal: confirmatory  Lab Review No labs on site today  Radiologic Imaging: Per hpi  04/30/2024 CLINICAL DATA:  Endometrial cancer in 2009 with pelvic mass in 2024, consistent with recurrence. Status post radiation therapy. * Tracking Code: BO *   EXAM: CT CHEST, ABDOMEN, AND PELVIS WITH CONTRAST   TECHNIQUE: Multidetector CT imaging of the chest, abdomen and pelvis was performed following the standard protocol during bolus administration of intravenous contrast.   RADIATION DOSE REDUCTION: This exam was performed according to the departmental dose-optimization program which includes automated exposure control, adjustment of the mA and/or kV according to patient size and/or use of iterative reconstruction technique.   CONTRAST:  OMNIPAQUE  IOHEXOL  300 MG/ML  SOLN   COMPARISON:  01/19/2024   FINDINGS: CT CHEST FINDINGS   Cardiovascular: Aortic atherosclerosis. Tortuous thoracic aorta. Upper normal ascending aortic caliber at  3.9 cm. Mild cardiomegaly, without pericardial effusion. Three vessel coronary artery calcification. No central pulmonary embolism, on this non-dedicated study.   Mediastinum/Nodes: No supraclavicular adenopathy. Prominent but not pathologically sized 7 mm left axillary node is unchanged. No mediastinal or hilar adenopathy.   Lungs/Pleura: No pleural fluid. Lingular scarring. Left lower lobe calcified granuloma. 2-3 mm ground-glass nodule in the right apex is unchanged on 25/3, nonspecific. No evidence of pulmonary metastasis.   Musculoskeletal: Included within the abdomen pelvic section.   CT ABDOMEN PELVIS FINDINGS   Hepatobiliary: Normal liver. Cholecystectomy, without biliary ductal dilatation.   Pancreas: Normal, without  mass or ductal dilatation.   Spleen: Normal in size, without focal abnormality.   Adrenals/Urinary Tract: Normal adrenal glands. Lower pole right renal staghorn type calculus again identified including up to 2.0 cm. Secondary minimal lower pole caliectasis. No hydroureter. The bladder is mildly thick walled including on 114/2.   Stomach/Bowel: Normal stomach, without wall thickening. Scattered colonic diverticula. Normal small bowel.   Vascular/Lymphatic: Aortic atherosclerosis. No abdominopelvic adenopathy.   Reproductive: Hysterectomy.  No adnexal mass.   Other: No well-defined residual or recurrent pelvic mass identified. No evidence of omental or peritoneal disease. No free intraperitoneal air.   Musculoskeletal: Osteopenia. Sacral sclerosis, likely related to insufficiency fractures, relatively similar. Degenerate disc disease at the lumbosacral junction.   IMPRESSION: 1. Status post hysterectomy. No well-defined residual pelvic mass. No new or progressive disease. 2. Similar right apical 3 mm ground-glass nodule, nonspecific but of doubtful clinical significance. 3. Incidental findings, including: Coronary artery atherosclerosis. Aortic  Atherosclerosis (ICD10-I70.0). Right-sided lower pole staghorn type calculus causing similar mild caliectasis. 4. Possible bladder wall thickening. Correlate with symptoms of cystitis.     Electronically Signed   By: Lore Rode M.D.   On: 04/30/2024 15:22       Assessment:  Morgan Dalton is a 73 y.o. female diagnosed with grade 1 (p53wt, ER+, pMMR) deeply invasive endometrioid endometrial cancer in 2009, isolated vaginal recurrence status post radiation therapy with excellent response based on exam and imaging.  Currently on letrozole  with excellent response to radiation and adjuvant letrozole .  No palpable disease on exam and 8 mm lesion on CT scan, no evidence of progressive disease.   Vaginal exam previously notable for severe radiation changes, mild stenosis, and atrophy.  Stenosis improved with dilator therapy.  Osteoporosis on Prolia  Low back pain with new asymmetric sclerosis within the right sacral wing, insufficiency fracture, symptoms improved.   History 3 mm pulmonary nodule, continue surveillance  Left lower pole caliectasis with a 2.1 cm staghorn type calculus, asymptomatic  Left lower extremity swelling.  Medical co-morbidities complicating care: diverticulitis, hypothyroid.  Plan:   Problem List Items Addressed This Visit       Genitourinary   Endometrial cancer (HCC) - Primary   Relevant Orders   US  Venous Img Lower Unilateral Left (DVT)   Other Visit Diagnoses       Encounter for monitoring aromatase inhibitor therapy         Leg edema, left       Relevant Orders   US  Venous Img Lower Unilateral Left (DVT)      Continue letrozole  2.5 mg daily. Defer DEXA follow up to her other providers. Will need repeat in 2026 or 2027. Continue Prolia, calcium and vitamin D.  Plan on PET/CT scan C/A/P and in person follow-up in 3 months.  If her PET is completely negative and she is having symptoms from the aromatase inhibitor we can still consider stopping that  therapy  Low back pain with new asymmetric sclerosis within the right sacral wing, insufficiency fracture, symptoms improved.   3 mm pulmonary nodule, stable on May 2025 imaging. Continue surveillance with repeat imaging.   LLE edema, concern for DVT vs lymphedema. Obtain doppler US  of the LLE.   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 Precautions provided.   The patient's diagnosis, an outline of the further diagnostic and laboratory studies which will be required, the recommendation for surgery, and alternatives were discussed with her.  All questions were answered to  their satisfaction.   The patient's diagnosis, an outline of the further diagnostic and laboratory studies which will be required, the recommendation, and alternatives were discussed.  All questions were answered to the patient's satisfaction.  Kenney Peacemaker, DNP, AGNP-C, AOCNP [facility] [phone] (clinic)  I personally had a face to face interaction and evaluated the patient jointly with the NP, Ms. Kenney Peacemaker.  I have reviewed her history and available records and have performed the key portions of the physical exam including inguinal lymph node survey, abdominal exam, pelvic exam with my findings confirming those documented above by the APP.  I have discussed the case with the APP and the patient.  I agree with the above documentation, assessment and plan which was fully formulated by me.  Counseling was completed by me.   I personally saw the patient and performed a substantive portion of this encounter in conjunction with the listed APP as documented above.  Zackery Brine Iola Manila, MD

## 2024-05-15 ENCOUNTER — Ambulatory Visit (INDEPENDENT_AMBULATORY_CARE_PROVIDER_SITE_OTHER): Payer: Self-pay | Admitting: Vascular Surgery

## 2024-05-15 VITALS — BP 131/83 | HR 78 | Resp 17 | Ht 64.0 in | Wt 203.0 lb

## 2024-05-15 DIAGNOSIS — I10 Essential (primary) hypertension: Secondary | ICD-10-CM | POA: Diagnosis not present

## 2024-05-15 DIAGNOSIS — I890 Lymphedema, not elsewhere classified: Secondary | ICD-10-CM

## 2024-05-15 DIAGNOSIS — E785 Hyperlipidemia, unspecified: Secondary | ICD-10-CM | POA: Diagnosis not present

## 2024-05-17 ENCOUNTER — Encounter (INDEPENDENT_AMBULATORY_CARE_PROVIDER_SITE_OTHER): Payer: Self-pay | Admitting: Vascular Surgery

## 2024-05-17 NOTE — Progress Notes (Addendum)
 Subjective:    Patient ID: Morgan Dalton, female    DOB: [birth year], 73 y.o.   MRN: [id number] Chief Complaint  Patient presents with   New Patient (Initial Visit)    Left leg edema    Morgan Dalton is a 73 yo female who presents to clinic today with chief complaint of bilateral lower extremity swelling left greater than right.  She states that her legs are not painful, throbbing, aching or uncomfortable. Today her left leg is swelling and she would like to pursue treatment. She endorses having seen someone years ago for a left lower extremity lipoma but no treatment was suggested. 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  She feels this may have contributed to her swelling today.  I agree.  She also mentions today that she has had a history of endometrial cancer that was treated back in 2009.  She then elaborated that she had a recurrence that was treated with radiation directly to her pelvic area.  She questions that this may also be possibly contributing to her lower extremity swelling.  I agree it may have some contribution to her swelling.    Review of Systems  Constitutional: Negative.   Musculoskeletal:  Positive for joint swelling.  Skin:  Positive for color change.  All other systems reviewed and are negative.      Objective:    Physical Exam Constitutional:      Appearance: Normal appearance. She is obese.  HENT:     Head: Normocephalic.  Eyes:     Pupils: Pupils are equal, round, and reactive to light.  Cardiovascular:     Rate and Rhythm: Normal rate and regular rhythm.     Pulses: Normal pulses.     Heart sounds: Normal heart sounds.  Pulmonary:     Effort: Pulmonary effort is normal.     Breath sounds: Normal breath sounds.  Abdominal:     General: Abdomen is flat. Bowel sounds are normal.     Palpations: Abdomen is soft.  Musculoskeletal:     Right lower leg: Edema present.      Left lower leg: Edema present.  Skin:    General: Skin is warm and dry.     Capillary Refill: Capillary refill takes 2 to 3 seconds.  Neurological:     General: No focal deficit present.     Mental Status: She is alert and oriented to person, place, and time. Mental status is at baseline.  Psychiatric:        Mood and Affect: Mood normal.        Behavior: Behavior normal.        Thought Content: Thought content normal.        Judgment: Judgment normal.    BP 131/83 (BP Location: Left Arm)   Pulse 78   Resp 17   Ht 5\' 4"  (1.626 m)   Wt 203 lb (92.1 kg)   BMI 34.84 kg/m   Past Medical History:  Diagnosis Date   Ankle fracture, left    Anxiety    Cancer (HCC)    Uterine Cancer   Dyspepsia    Endometrial cancer (HCC)    GERD (gastroesophageal reflux disease)    History of kidney stones    Hx of dysplastic nevus 10/16/2010   Right lower leg. Severe atypia. Excised: 11/25/2010. Margins free   Hypercholesterolemia  Hyperlipidemia    Hypertension    Hypothyroidism    Kidney stone    Osteopenia    Squamous cell carcinoma of skin 08/30/2019   Right temple. Well differentiated. Tx: [facility]    Social History   Socioeconomic History   Marital status: Married    Spouse name: Porfirio Bristol   Number of children: 2   Years of education: Not on file   Highest education level: Not on file  Occupational History   Not on file  Tobacco Use   Smoking status: Never   Smokeless tobacco: Never  Vaping Use   Vaping status: Never Used  Substance and Sexual Activity   Alcohol use: No   Drug use: No   Sexual activity: Not on file  Other Topics Concern   Not on file  Social History Narrative   Not on file   Social Drivers of Health   Financial Resource Strain: Low Risk  (05/09/2024)   Received from [facility]   Overall Financial Resource Strain (CARDIA)    Difficulty of Paying Living Expenses: Not hard at all  Food Insecurity: No Food Insecurity (05/09/2024)    Received from [facility]   Hunger Vital Sign    Worried About Running Out of Food in the Last Year: Never true    Ran Out of Food in the Last Year: Never true  Transportation Needs: No Transportation Needs (05/09/2024)   Received from [facility] - Transportation    In the past 12 months, has lack of transportation kept you from medical appointments or from getting medications?: No    Lack of Transportation (Non-Medical): No  Physical Activity: Not on file  Stress: Not on file  Social Connections: Not on file  Intimate Partner Violence: Not At Risk (03/18/2023)   Humiliation, Afraid, Rape, and Kick questionnaire    Fear of Current or Ex-Partner: No    Emotionally Abused: No    Physically Abused: No    Sexually Abused: No    Past Surgical History:  Procedure Laterality Date   ABDOMINAL HYSTERECTOMY     CATARACT EXTRACTION W/PHACO Right 12/07/2016   Procedure: CATARACT EXTRACTION PHACO AND INTRAOCULAR LENS PLACEMENT (IOC);  Surgeon: Clair Crews, MD;  Location: [facility] ORS;  Service: Ophthalmology;  Laterality: Right;  US  42.5 AP% 23.2 CDE 9.87 Fluid pack lot # [id number]   CATARACT EXTRACTION W/PHACO Left 01/04/2017   Procedure: CATARACT EXTRACTION PHACO AND INTRAOCULAR LENS PLACEMENT (IOC);  Surgeon: Clair Crews, MD;  Location: [facility] ORS;  Service: Ophthalmology;  Laterality: Left;  US  39.2 AP% 23.9 CDE 9.38 Fluid Pack Lot # [id number]   CHOLECYSTECTOMY  2014   COLONOSCOPY  2014   COLONOSCOPY WITH ESOPHAGOGASTRODUODENOSCOPY (EGD)  2004   COLONOSCOPY WITH PROPOFOL  N/A 11/07/2018   Procedure: COLONOSCOPY WITH PROPOFOL ;  Surgeon: Deveron Fly, MD;  Location: [facility] ENDOSCOPY;  Service: Endoscopy;  Laterality: N/A;   COLONOSCOPY WITH PROPOFOL  N/A 12/16/2023   Procedure: COLONOSCOPY WITH PROPOFOL ;  Surgeon: Shane Darling, MD;  Location: [facility] ENDOSCOPY;  Service: Endoscopy;  Laterality: N/A;   DILATION AND CURETTAGE OF UTERUS      EXTRACORPOREAL SHOCK WAVE LITHOTRIPSY     EYE SURGERY     GANGLION CYST EXCISION Right    wrist   HALLUX VALGUS LAPIDUS Left 08/11/2022   Procedure: HALLUX VALGUS LAPIDUS;  Surgeon: Anell Baptist, DPM;  Location: [facility] ORS;  Service: Podiatry;  Laterality: Left;   KIDNEY STONE SURGERY  several   KNEE ARTHROSCOPY Left 2014   partial medial meniscectomy, medial chondroplasty   NM PET TUMOR IMAGING  02/07/2023   TOTAL ABDOMINAL HYSTERECTOMY W/ BILATERAL SALPINGOOPHORECTOMY  2009    Family History  Problem Relation Age of Onset   Breast cancer Paternal Aunt     Allergies  Allergen Reactions   Statins Other (See Comments)    Myalgia        Latest Ref Rng & Units 05/12/2023    9:45 AM 05/05/2023    9:43 AM 04/28/2023    9:46 AM  CBC  WBC 4.0 - 10.5 K/uL 4.7  5.1  5.1   Hemoglobin 12.0 - 15.0 g/dL 78.2  95.6  21.3   Hematocrit 36.0 - 46.0 % 39.4  38.6  38.6   Platelets 150 - 400 K/uL 424  377  356        CMP     Component Value Date/Time   NA 137 01/21/2023 0956   K 4.2 01/21/2023 0956   K 4.0 12/18/2012 0947   CL 105 01/21/2023 0956   CO2 24 01/21/2023 0956   GLUCOSE 115 (H) 01/21/2023 0956   BUN 15 01/21/2023 0956   CREATININE 0.90 01/21/2023 0956   CALCIUM 8.9 01/21/2023 0956   PROT 7.5 01/21/2023 0956   PROT 7.5 09/18/2013 0855   ALBUMIN 3.7 01/21/2023 0956   ALBUMIN 3.6 09/18/2013 0855   AST 30 01/21/2023 0956   AST 27 09/18/2013 0855   ALT 31 01/21/2023 0956   ALT 27 09/18/2013 0855   ALKPHOS 103 01/21/2023 0956   ALKPHOS 106 09/18/2013 0855   BILITOT 0.8 01/21/2023 0956   BILITOT 0.2 09/18/2013 0855   GFRNONAA >60 01/21/2023 0956     No results found.     Assessment & Plan:   1. Lymphedema (Primary) Recommend:  I have had a long discussion with the patient regarding swelling and why it  causes symptoms.  Patient will begin wearing graduated compression on a daily basis a prescription was given. The patient will  wear the stockings first  thing in the morning and removing them in the evening. The patient is instructed specifically not to sleep in the stockings.   In addition, behavioral modification will be initiated.  This will include frequent elevation, use of over the counter pain medications and exercise such as walking.  Consideration for a lymph pump will also be made based upon the effectiveness of conservative therapy.  This would help to improve the edema control and prevent sequela such as ulcers and infections   Patient underwent duplex ultrasound of the venous system to ensure that DVT or reflux is not present in the left lower extremity only. These were negative today. Plan to repeat the study in 3 months to recheck the left lower extremity and right lower extremity.   The patient will follow-up with me after the ultrasound.   2. Benign essential hypertension Continue antihypertensive medications as already ordered, these medications have been reviewed and there are no changes at this time.  3. Hyperlipidemia, unspecified hyperlipidemia type Continue statin as ordered and reviewed, no changes at this time   Current Outpatient Medications on File Prior to Visit  Medication Sig Dispense Refill   ascorbic acid (VITAMIN C) 500 MG tablet Take 500 mg by mouth daily. Patient states she takes it October - April.     aspirin EC 81 MG tablet Take 81 mg by mouth daily.      calcium carbonate (OS-CAL -  DOSED IN MG OF ELEMENTAL CALCIUM) 1250 (500 Ca) MG tablet Take by mouth.     cholecalciferol (VITAMIN D3) 25 MCG (1000 UNIT) tablet Take 1,000 Units by mouth daily.     Coenzyme Q10 (CO Q-10) 100 MG CAPS Take 100 mg by mouth daily.     Cranberry 500 MG TABS Take by mouth.     ezetimibe (ZETIA) 10 MG tablet Take 10 mg by mouth daily.     Glucosamine-Chondroitin 750-600 MG TABS Take 1 tablet by mouth daily.     hydrochlorothiazide (HYDRODIURIL) 25 MG tablet Take 12.5 mg by mouth daily.     Krill Oil 1000 MG CAPS Take by  mouth.     letrozole  (FEMARA ) 2.5 MG tablet Take 1 tablet (2.5 mg total) by mouth daily. 90 tablet 3   levothyroxine (SYNTHROID, LEVOTHROID) 25 MCG tablet Take 25 mcg by mouth daily before breakfast.     Multiple Vitamins-Minerals (WOMENS 50+ MULTI VITAMIN PO) Take by mouth.     pantoprazole (PROTONIX) 40 MG tablet Take 40 mg by mouth every other day.     rosuvastatin (CRESTOR) 5 MG tablet Take 5 mg by mouth once a week. Thursday night     sertraline (ZOLOFT) 50 MG tablet Take 50 mg by mouth daily.     losartan (COZAAR) 50 MG tablet Take 1 tablet by mouth daily.     No current facility-administered medications on file prior to visit.    There are no Patient Instructions on file for this visit. No follow-ups on file.   Annamaria Barrette, NP

## 2024-05-31 ENCOUNTER — Encounter (INDEPENDENT_AMBULATORY_CARE_PROVIDER_SITE_OTHER): Payer: Self-pay

## 2024-06-07 ENCOUNTER — Encounter: Payer: Self-pay | Admitting: Dermatology

## 2024-07-04 ENCOUNTER — Encounter: Payer: Self-pay | Admitting: Urology

## 2024-07-04 ENCOUNTER — Other Ambulatory Visit: Payer: Self-pay | Admitting: Urology

## 2024-07-04 ENCOUNTER — Ambulatory Visit (INDEPENDENT_AMBULATORY_CARE_PROVIDER_SITE_OTHER): Admitting: Urology

## 2024-07-04 VITALS — BP 150/81 | HR 80 | Ht 64.0 in | Wt 200.0 lb

## 2024-07-04 DIAGNOSIS — R3129 Other microscopic hematuria: Secondary | ICD-10-CM | POA: Diagnosis not present

## 2024-07-04 DIAGNOSIS — N200 Calculus of kidney: Secondary | ICD-10-CM

## 2024-07-04 LAB — URINALYSIS, COMPLETE
Bilirubin, UA: NEGATIVE
Glucose, UA: NEGATIVE
Ketones, UA: NEGATIVE
Nitrite, UA: NEGATIVE
Specific Gravity, UA: 1.02 (ref 1.005–1.030)
Urobilinogen, Ur: 0.2 mg/dL (ref 0.2–1.0)
pH, UA: 6 (ref 5.0–7.5)

## 2024-07-04 LAB — MICROSCOPIC EXAMINATION: WBC, UA: >30 /HPF — AB (ref 0–5)

## 2024-07-04 NOTE — Progress Notes (Signed)
   07/04/24  CC:  Chief Complaint  Patient presents with   Cysto    HPI: Refer to Dr. Bjorn previous note 07/12/2023.  No complaints since last visit.  Blood pressure (!) 150/81, pulse 80, height 5' 4 (1.626 m), weight 200 lb (90.7 kg).  Cystoscopy Procedure Note  Patient identification was confirmed, informed consent was obtained, and patient was prepped using Betadine  solution.  Lidocaine  jelly was administered per urethral meatus.    Procedure: - Flexible cystoscope introduced, without any difficulty.   - Thorough search of the bladder revealed:    normal urethral meatus    normal urothelium    no stones    no ulcers     no tumors    no urethral polyps    no trabeculation  - Ureteral orifices were normal in position and appearance.  Post-Procedure: - Patient tolerated the procedure well  Assessment/ Plan:  1.  Chronic pyuria/microhematuria Negative cystoscopy UA findings most likely secondary to partial staghorn calculus Would not recommend annual cystoscopy with no previous history of urothelial carcinoma Follow-up 1 year with UA and if she has had gross hematuria or increasing microhematuria we will cystoscopy at that visit  2.  Right nephrolithiasis Partial right staghorn calculus KUB at next years visit   Glendia JAYSON Barba, MD

## 2024-08-02 ENCOUNTER — Other Ambulatory Visit (INDEPENDENT_AMBULATORY_CARE_PROVIDER_SITE_OTHER): Payer: Self-pay | Admitting: Vascular Surgery

## 2024-08-02 DIAGNOSIS — M7989 Other specified soft tissue disorders: Secondary | ICD-10-CM

## 2024-08-08 ENCOUNTER — Ambulatory Visit
Admission: RE | Admit: 2024-08-08 | Discharge: 2024-08-08 | Disposition: A | Discharge status: Home / Self Care | Source: Ambulatory Visit | Attending: Nurse Practitioner | Admitting: Nurse Practitioner

## 2024-08-08 DIAGNOSIS — C541 Malignant neoplasm of endometrium: Secondary | ICD-10-CM | POA: Insufficient documentation

## 2024-08-08 DIAGNOSIS — Z5181 Encounter for therapeutic drug level monitoring: Secondary | ICD-10-CM | POA: Diagnosis not present

## 2024-08-08 DIAGNOSIS — Z79811 Long term (current) use of aromatase inhibitors: Secondary | ICD-10-CM | POA: Insufficient documentation

## 2024-08-08 DIAGNOSIS — I700 Atherosclerosis of aorta: Secondary | ICD-10-CM | POA: Insufficient documentation

## 2024-08-08 LAB — GLUCOSE, CAPILLARY: Glucose-Capillary: 114 mg/dL — ABNORMAL HIGH (ref 70–99)

## 2024-08-08 MED ORDER — FLUDEOXYGLUCOSE F - 18 (FDG) INJECTION
10.4000 | Freq: Once | INTRAVENOUS | Status: AC | PRN
  Administered 2024-08-08: 10.72 via INTRAVENOUS

## 2024-08-15 ENCOUNTER — Ambulatory Visit (INDEPENDENT_AMBULATORY_CARE_PROVIDER_SITE_OTHER)

## 2024-08-15 ENCOUNTER — Encounter (INDEPENDENT_AMBULATORY_CARE_PROVIDER_SITE_OTHER): Payer: Self-pay | Admitting: Vascular Surgery

## 2024-08-15 ENCOUNTER — Ambulatory Visit (INDEPENDENT_AMBULATORY_CARE_PROVIDER_SITE_OTHER): Admitting: Vascular Surgery

## 2024-08-15 VITALS — BP 147/86 | HR 70 | Ht 64.0 in | Wt 206.0 lb

## 2024-08-15 DIAGNOSIS — E785 Hyperlipidemia, unspecified: Secondary | ICD-10-CM | POA: Diagnosis not present

## 2024-08-15 DIAGNOSIS — I890 Lymphedema, not elsewhere classified: Secondary | ICD-10-CM

## 2024-08-15 DIAGNOSIS — I10 Essential (primary) hypertension: Secondary | ICD-10-CM | POA: Diagnosis not present

## 2024-08-15 DIAGNOSIS — M7989 Other specified soft tissue disorders: Secondary | ICD-10-CM

## 2024-08-15 NOTE — Progress Notes (Signed)
 Subjective:    Patient ID: Morgan Dalton, female    DOB: 03-10-1951, 73 y.o.   MRN: [id number] Chief Complaint  Patient presents with   Follow-up    3 month follow up     Morgan Dalton is a 73 yo female who follows up in clinic today with chief complaint of left lower extremity swelling. She was last seen on 05/15/2024. During that visit we discussed treating her swelling by starting conventional therapy such as compression, elevation, rest and exercise. She endorses having some difficulty with the compression but was wearing it most of the time. We spent some time discussing alternative options to assist with compression. We also looked at non medical company's who provide compression socks online that I felt may better suit her to wear and be comfortable. She endorses that her left leg is not painful, throbbing, aching or uncomfortable. She is looking for treatment methods to help reduce swelling.  Patient underwent bilateral lower extremity venous ultrasounds to assess for reflux and DVT's. Patient right leg findings were all normal. However her left leg had significant reflux in her GSV at the SFJ down to the GSV at the proximal calf. We then had a discussion about Lazer surgery to help divert blood flow into her deeper veins.    Review of Systems  Constitutional: Negative.   Cardiovascular:  Positive for leg swelling.       Left Leg Swelling due to reflux  All other systems reviewed and are negative.      Objective:   Physical Exam Constitutional:      Appearance: Normal appearance. She is obese.  HENT:     Head: Normocephalic.  Eyes:     Pupils: Pupils are equal, round, and reactive to light.  Cardiovascular:     Rate and Rhythm: Normal rate and regular rhythm.  Musculoskeletal:        General: Swelling present.     Left lower leg: Edema present.  Skin:    General: Skin is warm and dry.     Capillary Refill: Capillary refill takes 2 to 3 seconds.  Neurological:     General:  No focal deficit present.     Mental Status: She is alert and oriented to person, place, and time. Mental status is at baseline.  Psychiatric:        Mood and Affect: Mood normal.        Behavior: Behavior normal.        Thought Content: Thought content normal.        Judgment: Judgment normal.     BP (!) 147/86   Pulse 70   Ht 5' 4 (1.626 m)   Wt 206 lb (93.4 kg)   BMI 35.36 kg/m   Past Medical History:  Diagnosis Date   Ankle fracture, left    Anxiety    Cancer (HCC)    Uterine Cancer   Dyspepsia    Endometrial cancer (HCC)    GERD (gastroesophageal reflux disease)    History of kidney stones    Hx of dysplastic nevus 10/16/2010   Right lower leg. Severe atypia. Excised: 11/25/2010. 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 Well differentiated. Tx: [facility]    Social History   Socioeconomic History   Marital status: Married    Spouse name:  Robert   Number of children: 2   Years of education: Not on file   Highest education level: Not on file  Occupational History   Not on file  Tobacco Use   Smoking status: Never   Smokeless tobacco: Never  Vaping Use   Vaping status: Never Used  Substance and Sexual Activity   Alcohol use: No   Drug use: No   Sexual activity: Not on file  Other Topics Concern   Not on file  Social History Narrative   Not on file   Social Drivers of Health   Financial Resource Strain: Low Risk  (05/09/2024)   Received from [facility]   Overall Financial Resource Strain (CARDIA)    Difficulty of Paying Living Expenses: Not hard at all  Food Insecurity: No Food Insecurity (05/09/2024)   Received from [facility]   Hunger Vital Sign    Within the past 12 months, you worried that your food would run out before you got the money to buy more.: Never true    Within  the past 12 months, the food you bought just didn't last and you didn't have money to get more.: Never true  Transportation Needs: No Transportation Needs (05/09/2024)   Received from [facility]    In the past 12 months, has lack of transportation kept you from medical appointments or from getting medications?: No    Lack of Transportation (Non-Medical): No  Physical Activity: Not on file  Stress: Not on file  Social Connections: Not on file  Intimate Partner Violence: Not At Risk (03/18/2023)   Humiliation, Afraid, Rape, and Kick questionnaire    Fear of Current or Ex-Partner: No    Emotionally Abused: No    Physically Abused: No    Sexually Abused: No    Past Surgical History:  Procedure Laterality Date   ABDOMINAL HYSTERECTOMY     CATARACT EXTRACTION W/PHACO Right 12/07/2016   Procedure: CATARACT EXTRACTION PHACO AND INTRAOCULAR LENS PLACEMENT (IOC);  Surgeon: Elsie Carmine, MD;  Location: [facility] ORS;  Service: Ophthalmology;  Laterality: Right;  US  42.5 AP% 23.2 CDE 9.87 Fluid pack lot # [id number]   CATARACT EXTRACTION W/PHACO Left 01/04/2017   Procedure: CATARACT EXTRACTION PHACO AND INTRAOCULAR LENS PLACEMENT (IOC);  Surgeon: Elsie Carmine, MD;  Location: [facility] ORS;  Service: Ophthalmology;  Laterality: Left;  US  39.2 AP% 23.9 CDE 9.38 Fluid Pack Lot # [id number]   CHOLECYSTECTOMY  2014   COLONOSCOPY  2014   COLONOSCOPY WITH ESOPHAGOGASTRODUODENOSCOPY (EGD)  2004   COLONOSCOPY WITH PROPOFOL  N/A 11/07/2018   Procedure: COLONOSCOPY WITH PROPOFOL ;  Surgeon: Gaylyn Gladis PENNER, MD;  Location: [facility] ENDOSCOPY;  Service: Endoscopy;  Laterality: N/A;   COLONOSCOPY WITH PROPOFOL  N/A 12/16/2023   Procedure: COLONOSCOPY WITH PROPOFOL ;  Surgeon: Maryruth Ole DASEN, MD;  Location: [facility] ENDOSCOPY;  Service: Endoscopy;  Laterality: N/A;   DILATION AND CURETTAGE OF UTERUS     EXTRACORPOREAL SHOCK WAVE LITHOTRIPSY     EYE SURGERY     GANGLION  CYST EXCISION Right    wrist   HALLUX VALGUS LAPIDUS Left 08/11/2022   Procedure: HALLUX VALGUS LAPIDUS;  Surgeon: Ashley Soulier, DPM;  Location: [facility] ORS;  Service: Podiatry;  Laterality: Left;   KIDNEY STONE SURGERY     several   KNEE ARTHROSCOPY Left 2014   partial medial meniscectomy, medial chondroplasty   NM PET TUMOR IMAGING  02/07/2023   TOTAL ABDOMINAL HYSTERECTOMY W/ BILATERAL SALPINGOOPHORECTOMY  2009    Family History  Problem Relation Age of Onset   Breast cancer Paternal Aunt     Allergies  Allergen Reactions   Statins Other (See Comments)    Myalgia        Latest Ref Rng & Units 05/12/2023    9:45 AM 05/05/2023    9:43 AM 04/28/2023    9:46 AM  CBC  WBC 4.0 - 10.5 K/uL 4.7  5.1  5.1   Hemoglobin 12.0 - 15.0 g/dL 87.2  87.4  87.6   Hematocrit 36.0 - 46.0 % 39.4  38.6  38.6   Platelets 150 - 400 K/uL 424  377  356       CMP     Component Value Date/Time   NA 137 01/21/2023 0956   K 4.2 01/21/2023 0956   K 4.0 12/18/2012 0947   CL 105 01/21/2023 0956   CO2 24 01/21/2023 0956   GLUCOSE 115 (H) 01/21/2023 0956   BUN 15 01/21/2023 0956   CREATININE 0.90 01/21/2023 0956   CALCIUM 8.9 01/21/2023 0956   PROT 7.5 01/21/2023 0956   PROT 7.5 09/18/2013 0855   ALBUMIN 3.7 01/21/2023 0956   ALBUMIN 3.6 09/18/2013 0855   AST 30 01/21/2023 0956   AST 27 09/18/2013 0855   ALT 31 01/21/2023 0956   ALT 27 09/18/2013 0855   ALKPHOS 103 01/21/2023 0956   ALKPHOS 106 09/18/2013 0855   BILITOT 0.8 01/21/2023 0956   BILITOT 0.2 09/18/2013 0855   GFRNONAA >60 01/21/2023 0956     No results found.     Assessment & Plan:   1. Lymphedema (Primary) Patient returns to clinic today for 47-month follow-up with use of conventional therapy for the left lower extremity swelling and edema.  Patient underwent bilateral lower extremity venous ultrasounds to assess for reflux and DVT.  She was negative for DVTs but was positive for significant reflux in her left lower  extremity.  Reflux started in the greater saphenous vein at the [facility] continuing all the way down her greater saphenous vein to her proximal calf.  I had a lengthy discussion with the patient about multiple therapy options.  The first would be to stick with conventional therapy and try nonmedical store compression socks with 20 to 30 mmHg compression for her legs.  I feel these will be more comfortable with a medical grade compression and she may feel more comfortable wearing these more often.  She was willing to investigate and possibly try these alternatives to see if it helps with her swelling.  We also discussed laser therapy as a possible treatment for her reflux and her left greater saphenous vein.  At this time she would like to think about this and continue to try conservative therapy such as compression, rest, elevation and exercise.  We agreed that she would follow-up as needed, thus giving her the ability to continue to work on alternative ways of conservative therapy but if unsuccessful and she decides she would like to pursue laser therapy we can do so.  2. Benign essential hypertension Continue antihypertensive medications as already ordered, these medications have been reviewed and there are no changes at this time.  3. Hyperlipidemia, unspecified hyperlipidemia type Continue statin as ordered and reviewed, no changes at this time   Current Outpatient Medications on File Prior to Visit  Medication Sig Dispense Refill   ascorbic acid (VITAMIN C) 500 MG tablet Take 500 mg by mouth daily. Patient states she takes it October -  April.     aspirin EC 81 MG tablet Take 81 mg by mouth daily.      calcium carbonate (OS-CAL - DOSED IN MG OF ELEMENTAL CALCIUM) 1250 (500 Ca) MG tablet Take by mouth.     cholecalciferol (VITAMIN D3) 25 MCG (1000 UNIT) tablet Take 1,000 Units by mouth daily.     Coenzyme Q10 (CO Q-10) 100 MG CAPS Take 100 mg by mouth daily.     Cranberry 500 MG TABS Take by mouth.      ezetimibe (ZETIA) 10 MG tablet Take 10 mg by mouth daily.     Glucosamine-Chondroitin 750-600 MG TABS Take 1 tablet by mouth daily.     hydrochlorothiazide (HYDRODIURIL) 25 MG tablet Take 12.5 mg by mouth daily.     Krill Oil 1000 MG CAPS Take by mouth.     letrozole  (FEMARA ) 2.5 MG tablet Take 1 tablet (2.5 mg total) by mouth daily. 90 tablet 3   levothyroxine (SYNTHROID, LEVOTHROID) 25 MCG tablet Take 25 mcg by mouth daily before breakfast.     losartan (COZAAR) 50 MG tablet Take 1 tablet by mouth daily.     Multiple Vitamins-Minerals (WOMENS 50+ MULTI VITAMIN PO) Take by mouth.     pantoprazole (PROTONIX) 40 MG tablet Take 40 mg by mouth every other day.     rosuvastatin (CRESTOR) 5 MG tablet Take 5 mg by mouth once a week. Thursday night     sertraline (ZOLOFT) 50 MG tablet Take 50 mg by mouth daily.     No current facility-administered medications on file prior to visit.    There are no Patient Instructions on file for this visit. No follow-ups on file.   Gwendlyn JONELLE Shank, NP

## 2024-08-22 ENCOUNTER — Ambulatory Visit

## 2024-08-22 ENCOUNTER — Inpatient Hospital Stay: Attending: Obstetrics and Gynecology | Admitting: Nurse Practitioner

## 2024-08-22 ENCOUNTER — Other Ambulatory Visit: Payer: Self-pay

## 2024-08-22 VITALS — BP 160/75 | HR 71 | Temp 98.6°F | Resp 20 | Wt 205.7 lb

## 2024-08-22 DIAGNOSIS — Z9221 Personal history of antineoplastic chemotherapy: Secondary | ICD-10-CM | POA: Insufficient documentation

## 2024-08-22 DIAGNOSIS — R911 Solitary pulmonary nodule: Secondary | ICD-10-CM | POA: Insufficient documentation

## 2024-08-22 DIAGNOSIS — Z8542 Personal history of malignant neoplasm of other parts of uterus: Secondary | ICD-10-CM | POA: Diagnosis present

## 2024-08-22 DIAGNOSIS — Z79811 Long term (current) use of aromatase inhibitors: Secondary | ICD-10-CM | POA: Diagnosis not present

## 2024-08-22 DIAGNOSIS — Z90722 Acquired absence of ovaries, bilateral: Secondary | ICD-10-CM | POA: Insufficient documentation

## 2024-08-22 DIAGNOSIS — Z90710 Acquired absence of both cervix and uterus: Secondary | ICD-10-CM | POA: Insufficient documentation

## 2024-08-22 DIAGNOSIS — Z923 Personal history of irradiation: Secondary | ICD-10-CM | POA: Insufficient documentation

## 2024-08-22 DIAGNOSIS — C541 Malignant neoplasm of endometrium: Secondary | ICD-10-CM

## 2024-08-22 DIAGNOSIS — C786 Secondary malignant neoplasm of retroperitoneum and peritoneum: Secondary | ICD-10-CM | POA: Diagnosis present

## 2024-08-22 DIAGNOSIS — Z5181 Encounter for therapeutic drug level monitoring: Secondary | ICD-10-CM | POA: Diagnosis not present

## 2024-08-22 DIAGNOSIS — I7121 Aneurysm of the ascending aorta, without rupture: Secondary | ICD-10-CM

## 2024-08-22 MED ORDER — COMIRNATY 30 MCG/0.3ML IM SUSY
PREFILLED_SYRINGE | INTRAMUSCULAR | 0 refills | Status: DC
  Filled 2024-08-29: qty 0.3, 1d supply, fill #0

## 2024-08-22 NOTE — Progress Notes (Signed)
 Gynecologic Oncology Interval Visit   Referring Provider: 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 female who is seen in consultation from [name] with history of endometrial cancer in 2009, with incidental right pelvic mass in 01/2023, consistent with recurrent disease, s/p radiation, now on letrozole  since August 2024, who returns to clinic for follow up and discussion of imaging results.      Treatment Summary:  2009- vaginal hysterectomy with Dr Morgan Dalton at [facility]. Pathology consistent with endometrial adenocarcinoma 02/02/2008- underwent RA BSO, bilateral pelvic LN dissection, supraumbilical laparotomy, periaortic nodes with Dr Morgan Dalton at [facility]. Stage Ic. Elected for adjuvant vaginal brachytherapy 01/21/23- ER for abdominal pain. Solid mass 3.2 x 2.2 04/07/2023-05/12/2023 - total dose with 50 Gy completed radiation. 07/20/2023- started letrozole   10/12/23- CT - response to therapy. New sclerosis of right sacral wing.  10/21/23- MRI Lumbar & Sacrum - vertical fracture of right sacral ala with edema and enhancement.  12/24 - diagnosed with osteoporosis and started on Prolia.  04/30/24- CT C/A/P w/ contrast- No new or progressive disease. 08/08/24- PET as below.   08/08/24- PET Restaging  FINDINGS: Mediastinal blood pool activity: SUV max 2.7 Liver activity: SUV max NA   NECK: No hypermetabolic lymph nodes in the neck. Incidental CT findings: None.   CHEST: No hypermetabolic mediastinal or hilar nodes. 7 mm short axis left axillary node identified on recent CT of 04/30/2024 is hypermetabolic with SUV max = 7.4. This lymph node with 7 mm short axis on the 01/19/2024 exam and 4-5 mm short axis on the PET-CT from 02/10/2023. No suspicious pulmonary nodules on the CT scan.   Incidental CT findings: Ascending thoracic aorta measures up to 4.3 cm diameter. Mild atherosclerotic calcification  is noted in the wall of the thoracic aorta. Calcified granuloma noted left lower lobe. Centrilobular emphsyema noted. Tiny right apical ground-glass nodule seen on previous CT of 04/30/2024 not readily evident by PET-CT today.   ABDOMEN/PELVIS: No abnormal hypermetabolic activity within the liver, pancreas, adrenal glands, or spleen. No hypermetabolic lymph nodes in the abdomen or pelvis.   Incidental CT findings: Nonobstructing staghorn calculus again noted right kidney, similar to prior studies. Left colonic diverticulosis without diverticulitis.   SKELETON: No focal hypermetabolic activity to suggest skeletal metastasis.   Incidental CT findings: Bilateral sacral insufficiency fractures stable since 04/30/2024.   IMPRESSION: 1. 7 mm short axis left axillary node identified on recent CT of 04/30/2024 is hypermetabolic with SUV max = 7.4. This lymph node was 7 mm short axis on the 01/19/2024 CT exam and 4-5 mm short axis on the PET-CT from 02/10/2023. While relatively stable in size since 01/19/2024, given persistence and associated hypermetabolism, metastatic disease a concern. 2. No other suspicious hypermetabolic disease on today's study. 3.  Aortic Atherosclerosis (ICD10-I70.0).   ADDENDUM: As noted in the body of the report, ascending thoracic aorta measures up to 4.3 cm diameter. Recommend annual imaging followup by CTA or MRA. This recommendation follows 2010 ACCF/AHA/AATS/ACR/ASA/SCA/SCAI/SIR/STS/SVM Guidelines for the Diagnosis and Management of Patients with Thoracic Aortic Disease. Circulation. 2010; 121: Z733-z630. Aortic aneurysm NOS (ICD10-I71.9)   Electronically Signed: By: Morgan Dalton M.D. On: 08/17/2024 05:21  She continues letrozole . She continues to be followed by endocrinology for osteoporosis. She continues to have intermittent spotting a few times a month but none this month. She feels fatigued and has intermittent pain/pulling. Has noticed small amount of  vaginal discharge. Has chronic  left lower extremity edema- saw vascular but unable to tolerate compession hose. She was diagnosed with trigger thumb and is seeing ortho.   Last colonoscopy - 12/16/23 Last mammogram 03/12/24    Gynecologic Oncology History:  She underwent vaginal hysterectomy with Dr Morgan Dalton at [facility] in 2009. 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 She was referred to Dr. Cleotilde at [facility] and underwent robotic assisted bilateral salpino-oophorectomy, bilateral pelvic lymph node dissection, supraumbilical laparotomy and periaortic lymph node dissection. Surgery complicated by adhesions. Final pathology revealed a deeply invasive (two thirds), well differentiated endometrioid adenocarcinoma, stage Ic. Washings cervix and adnexa were negative. She elected for adjuvant vaginal brachytherapy at [facility] and completed treatment 02/02/2008.   Patient went to ER for RUQ abdominal pain. CT 01/21/23 showed diverticulitis at hepatic flexure with some free air. Treated with antibiotics for diverticulitis.  Incidental finding of solid mass of right posterior adnexa measuring 3.2 x 2.2 cm, new since previous CT scan in 2020. She also has a large staghorn calculus in the right kidney.  She was seen by [name] in [place].   Patient provided us  with her records from her endometrial cancer which were reviewed in detail, copied, and scanned into her chart.  Last colonoscopy 11/07/2018- report reviewed and showed tortuous colon, diverticulosis, recommendation to repeat in 5 years (2024)  01/30/23- MRI Abdomen and Pelvis w wo contrast S/p hysterectomy. Left ovary not visualized. Solid heterogeneously enhancing right ovarian lesion measuring 3.0 x 1.9 x 2.3 cm. No ascites. No lymphadenopathy. Inflammation of proximal transverse colon consistent with residual diverticulitis.  MRI was addended to reflect that mass was present on CT from 2020 and measured 1.6 x 1.2 cm at  that time. No definite communication with the colon or small bowel loops and the appendix is identified as separate from this lesion.   HE4- 142 (elevated) CA 125 - 18 (normal) ROMA- 30.57% (high risk)  01/25/23- Pap- NILM  A PET scan was performed to better ascertain the etiology of the mass.  02/10/23- PET 1. Hypermetabolic solid mass in the RIGHT pelvis consistent with ovarian neoplasm. 2. No evidence of metastatic adenopathy in the pelvis or periaortic retroperitoneum. 3. No evidence of metastatic disease outside the pelvis. 4. Post hysterectomy anatomy. No abnormal metabolic activity associated with the vagina. 5. RIGHT renal staghorn calculus.  Imaging was reviewed by Dr. Malva. Report was addended to reflect that based on her prior surgical hysterectomy and oophorectomy, intensely hypermetabolic solid mass is favored to be a neoplastic deposit, potentially from remote endometrial carcinoma.   03/04/23 Image guided biopsy of right pelvic mass. DIAGNOSIS: A. SOFT TISSUE, RIGHT ADNEXAL; CT-GUIDED CORE NEEDLE BIOPSY: - POSITIVE FOR MALIGNANCY. - METASTATIC ADENOCARCINOMA, COMPATIBLE WITH ENDOMETRIOID ADENOCARCINOMA.  Comment: The patient's prior history of endometrioid adenocarcinoma in 2009 is noted.  Biopsy sections display an abnormal cribriform and somewhat papillary glandular proliferation, histologically compatible with the patient's previously diagnosed endometrioid adenocarcinoma.  Immunohistochemical studies show tumor cells to be positive for pancytokeratin, PAX8, estrogen receptor (greater than 90%, strong), and progesterone receptor (greater than 90%, strong).  Tumor cells are negative for CK7, WT1, CDX2, and Napsin A.  P53 demonstrates a wild-type (non-mutated) pattern of expression.  Taken together, the morphologic and immunophenotypic findings support the above diagnosis.  Tumor cells are positive for estrogen receptor (greater than 90%,  strong) and progesterone receptor  (greater than 90%, strong). Tumor cells display wild-type (9 mutated) expression of p53.   DNA Mismatch Repair (MMR)  MLH1: Intact nuclear expression  MSH2: Intact nuclear expression  MSH6: Intact nuclear expression  PMS2: Intact nuclear expression   04/07/2023-05/12/2023 total dose with 50 Gy completed radiation.   06/24/2023 CT A/P Findings: The lesion centered just superior and lateral to the right side of the vaginal cuff measures 2.0 x 1.6 cm on 72/2. It is centrally hypoattenuating today (presumably secondary to necrosis). This makes differentiation from surrounding bowel loops challenging. Decreased in size from 3.2 x 2.2 cm on the prior CT. IMPRESSION: 1. Response to therapy of right pelvic mass, as evidenced by decrease in size and development of central necrosis. 2. No new or progressive disease. 3. Similar right lower pole staghorn type renal calculus. 4. Hepatomegaly and possible hepatic steatosis. 5.  Aortic Atherosclerosis (ICD10-I70.0).  07/20/2023 She was counseled about Megace versus letrozole . She opted for letrozole .   We reviewed her last DEXA which was DEXA 08/09/2022 INTERPRETATION:  Osteopenia/Low bone mass. No significant change in bone mineral density from  08-08-2020.   She is followed by her primary care provider.  With regard to the osteopenia he recommended calcium 300 mg once daily and Vit D 1000U daily.    10/12/2023 CT A/P CT ABDOMEN AND PELVIS WITH CONTRAST IMPRESSION: 1. Interval decrease in size of right posterior pelvic sidewall soft tissue nodule. 2. New asymmetric sclerosis within the right sacral wing. This is somewhat ill-defined. Findings may be related to insufficiency fracture or less likely osseous metastasis. Consider further evaluation with contrast enhanced MRI of the pelvis. 3. Stable 3 cm staghorn calculus within the inferior pole of the right kidney. 4. Colonic diverticulosis without signs of acute diverticulitis. 5.  Aortic Atherosclerosis  (ICD10-I70.0).  11/29/23 - DEXA diagnosed with osteoporosis and started on Prolia.   CT scan 01/19/2024  CT C/A/P IMPRESSION: 1. Similar size of the right posterior pelvic soft tissue mass which is difficult to definitively assess given its small size and close approximation with the pelvic structures. 2. 3 mm pulmonary nodule in the right lung apex common nonspecific. Suggest attention on short-term interval follow-up chest CT. 3. No evidence of new or progressive disease in the abdomen or pelvis. 4. Left lower pole caliectasis with a 2.1 cm staghorn type calculus. 5. Sacral insufficiency fracture again seen. 6.  Aortic Atherosclerosis (ICD10-I70.0).   Problem List: Patient Active Problem List   Diagnosis Date Noted   Prediabetes 04/23/2024   Endometrial cancer (HCC) 03/20/2023   Benign essential hypertension 01/27/2023   Diverticulitis 01/26/2023   Thrombocytosis 11/29/2019   GERD (gastroesophageal reflux disease) 12/31/2016   History of nephrolithiasis 12/31/2016   Hyperlipidemia, unspecified 12/31/2016   Insomnia 12/31/2016   Osteopenia 12/31/2016   Acquired hypothyroidism 10/26/2016   Bladder neoplasm of uncertain malignant potential 09/09/2016   Bladder pain 08/11/2016   Chronic cystitis 08/11/2016   Mild episode of recurrent major depressive disorder (HCC) 07/23/2016   History of endometrial cancer 01/23/2016   Recurrent carcinoma of endometrium (HCC) 01/23/2016   Renal colic 06/19/2014   Right upper quadrant pain 08/07/2012   Kidney stone 08/07/2012   Gross hematuria 08/07/2012   Malignant neoplasm of corpus uteri (HCC) 08/07/2012   Microscopic hematuria 08/07/2012   Urinary tract infection 08/07/2012    Past Medical History: Past Medical History:  Diagnosis Date   Ankle fracture, left    Anxiety    Cancer (HCC)    Uterine Cancer   Dyspepsia    Endometrial cancer (HCC)    GERD (gastroesophageal reflux disease)    History of kidney stones    Hx  of dysplastic  nevus 10/16/2010   Right lower leg. Severe atypia. Excised: 11/25/2010. 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 Well differentiated. Tx: [facility]    Past Surgical History: Past Surgical History:  Procedure Laterality Date   ABDOMINAL HYSTERECTOMY     CATARACT EXTRACTION W/PHACO Right 12/07/2016   Procedure: CATARACT EXTRACTION PHACO AND INTRAOCULAR LENS PLACEMENT (IOC);  Surgeon: Elsie Carmine, MD;  Location: [facility] ORS;  Service: Ophthalmology;  Laterality: Right;  US  42.5 AP% 23.2 CDE 9.87 Fluid pack lot # [id number]   CATARACT EXTRACTION W/PHACO Left 01/04/2017   Procedure: CATARACT EXTRACTION PHACO AND INTRAOCULAR LENS PLACEMENT (IOC);  Surgeon: Elsie Carmine, MD;  Location: [facility] ORS;  Service: Ophthalmology;  Laterality: Left;  US  39.2 AP% 23.9 CDE 9.38 Fluid Pack Lot # [id number]   CHOLECYSTECTOMY  2014   COLONOSCOPY  2014   COLONOSCOPY WITH ESOPHAGOGASTRODUODENOSCOPY (EGD)  2004   COLONOSCOPY WITH PROPOFOL  N/A 11/07/2018   Procedure: COLONOSCOPY WITH PROPOFOL ;  Surgeon: Gaylyn Gladis PENNER, MD;  Location: [facility] ENDOSCOPY;  Service: Endoscopy;  Laterality: N/A;   COLONOSCOPY WITH PROPOFOL  N/A 12/16/2023   Procedure: COLONOSCOPY WITH PROPOFOL ;  Surgeon: Maryruth Ole DASEN, MD;  Location: [facility] ENDOSCOPY;  Service: Endoscopy;  Laterality: N/A;   DILATION AND CURETTAGE OF UTERUS     EXTRACORPOREAL SHOCK WAVE LITHOTRIPSY     EYE SURGERY     GANGLION CYST EXCISION Right    wrist   HALLUX VALGUS LAPIDUS Left 08/11/2022   Procedure: HALLUX VALGUS LAPIDUS;  Surgeon: Ashley Soulier, DPM;  Location: [facility] ORS;  Service: Podiatry;  Laterality: Left;   KIDNEY STONE SURGERY     several   KNEE ARTHROSCOPY Left 2014   partial medial meniscectomy, medial chondroplasty   NM PET TUMOR IMAGING  02/07/2023   TOTAL ABDOMINAL HYSTERECTOMY W/ BILATERAL  SALPINGOOPHORECTOMY  2009    Past Gynecologic History:  G2P2 Post menopausal Menarche: age 39  OB History:  OB History  No obstetric history on file.   Family History: Family History  Problem Relation Age of Onset   Breast cancer Paternal Aunt    Social History: Social History   Socioeconomic History   Marital status: Married    Spouse name: Lamar   Number of children: 2   Years of education: Not on file   Highest education level: Not on file  Occupational History   Not on file  Tobacco Use   Smoking status: Never   Smokeless tobacco: Never  Vaping Use   Vaping status: Never Used  Substance and Sexual Activity   Alcohol use: No   Drug use: No   Sexual activity: Not on file  Other Topics Concern   Not on file  Social History Narrative   Not on file   Social Drivers of Health   Financial Resource Strain: Low Risk  (05/09/2024)   Received from [facility]   Overall Financial Resource Strain (CARDIA)    Difficulty of Paying Living Expenses: Not hard at all  Food Insecurity: No Food Insecurity (05/09/2024)   Received from [facility]   Hunger Vital Sign    Within the past 12 months, you worried that your food would run out before you got the money to buy more.: Never true    Within the past 12 months, the food  you bought just didn't last and you didn't have money to get more.: Never true  Transportation Needs: No Transportation Needs (05/09/2024)   Received from [facility] - Transportation    In the past 12 months, has lack of transportation kept you from medical appointments or from getting medications?: No    Lack of Transportation (Non-Medical): No  Physical Activity: Not on file  Stress: Not on file  Social Connections: Not on file  Intimate Partner Violence: Not At Risk (03/18/2023)   Humiliation, Afraid, Rape, and Kick questionnaire    Fear of Current or Ex-Partner: No    Emotionally Abused:  No    Physically Abused: No    Sexually Abused: No   Allergies: Allergies  Allergen Reactions   Statins Other (See Comments)    Myalgia    Current Medications: Current Outpatient Medications  Medication Sig Dispense Refill   ascorbic acid (VITAMIN C) 500 MG tablet Take 500 mg by mouth daily. Patient states she takes it October - April.     aspirin EC 81 MG tablet Take 81 mg by mouth daily.      calcium carbonate (OS-CAL - DOSED IN MG OF ELEMENTAL CALCIUM) 1250 (500 Ca) MG tablet Take by mouth.     cholecalciferol (VITAMIN D3) 25 MCG (1000 UNIT) tablet Take 1,000 Units by mouth daily.     Coenzyme Q10 (CO Q-10) 100 MG CAPS Take 100 mg by mouth daily.     Cranberry 500 MG TABS Take by mouth.     ezetimibe (ZETIA) 10 MG tablet Take 10 mg by mouth daily.     Glucosamine-Chondroitin 750-600 MG TABS Take 1 tablet by mouth daily.     hydrochlorothiazide (HYDRODIURIL) 25 MG tablet Take 12.5 mg by mouth daily.     Krill Oil 1000 MG CAPS Take by mouth.     letrozole  (FEMARA ) 2.5 MG tablet Take 1 tablet (2.5 mg total) by mouth daily. 90 tablet 3   levothyroxine (SYNTHROID, LEVOTHROID) 25 MCG tablet Take 25 mcg by mouth daily before breakfast.     losartan (COZAAR) 50 MG tablet Take 1 tablet by mouth daily.     Multiple Vitamins-Minerals (WOMENS 50+ MULTI VITAMIN PO) Take by mouth.     pantoprazole (PROTONIX) 40 MG tablet Take 40 mg by mouth every other day.     rosuvastatin (CRESTOR) 5 MG tablet Take 5 mg by mouth once a week. Thursday night     sertraline (ZOLOFT) 50 MG tablet Take 50 mg by mouth daily.     No current facility-administered medications for this visit.   Review of Systems General:  fatigue, nonpalpable LN (seen on CT but unable to palpate) Skin: no complaints Eyes: no complaints HEENT: no complaints Breasts: no complaints Pulmonary: cough Cardiac: chronic left leg swelling Gastrointestinal: abdominal bloating Genitourinary/Sexual: vaginal discharge, spotting, pain (per  HPI- intermittent; not worsening) Ob/Gyn: no complaints Musculoskeletal: no complaints Hematology: no complaints Neurologic/Psych: no complaints   Objective:  Physical Examination:  BP (!) 160/75   Pulse 71   Temp 98.6 F (37 C)   Resp 20   Wt 205 lb 11.2 oz (93.3 kg)   SpO2 100%   BMI 35.31 kg/m    Performance Status: 1  GENERAL: Patient is a well appearing female in no acute distress HEENT:  Sclera clear. Anicteric NODES:  Negative axillary, supraclavicular, inguinal lymph node survery LUNGS:  Clear to auscultation bilaterally.   HEART:  Regular rate and rhythm.  ABDOMEN:  Soft, nontender.  Palpable right inguinal LN- nontender, not fixed, not enlarged. No hernias, incisions well healed. No masses or ascites EXTREMITIES:  L > R. Generalized. No adenopathy, redness, pitting, or warmth.  SKIN:  Clear with no obvious rashes or skin changes.  NEURO:  Nonfocal. Well oriented.  Appropriate affect.  Pelvic: Exam chaperoned by RN EGBUS: no lesions Vagina: no lesions or discharge. Foreshortened- ~ 4 cm. Severe agglutination and atrophy with evidence of prior bleeding with pinpoint bruises. No nodularity, firmness, or lesions. No findings concerning for recurrence. Narrowing improved.  Cervix: surgically absent Uterus: surgically absent.  BME: no palpable masses Rectovaginal: deferred  Lab Review No labs on site today  Radiologic Imaging: CLINICAL DATA:  Subsequent treatment strategy for endometrial carcinoma. Status post pelvic recurrence and subsequent salvage radiation therapy.   EXAM: NUCLEAR MEDICINE PET SKULL BASE TO THIGH   TECHNIQUE: 10.7 mCi F-18 FDG was injected intravenously. Full-ring PET imaging was performed from the skull base to thigh after the radiotracer. CT data was obtained and used for attenuation correction and anatomic localization.   Fasting blood glucose: 114 mg/dl   COMPARISON:  Chest abdomen pelvis CT 04/30/2024.  PET-CT 02/10/2023    FINDINGS: Mediastinal blood pool activity: SUV max 2.7   Liver activity: SUV max NA   NECK: No hypermetabolic lymph nodes in the neck.   Incidental CT findings: None.   CHEST: No hypermetabolic mediastinal or hilar nodes. 7 mm short axis left axillary node identified on recent CT of 04/30/2024 is hypermetabolic with SUV max = 7.4. This lymph node with 7 mm short axis on the 01/19/2024 exam and 4-5 mm short axis on the PET-CT from 02/10/2023. No suspicious pulmonary nodules on the CT scan.   Incidental CT findings: Ascending thoracic aorta measures up to 4.3 cm diameter. Mild atherosclerotic calcification is noted in the wall of the thoracic aorta. Calcified granuloma noted left lower lobe. Centrilobular emphsyema noted. Tiny right apical ground-glass nodule seen on previous CT of  04/30/2024 not readily evident by PET-CT today.   ABDOMEN/PELVIS: No abnormal hypermetabolic activity within the liver, pancreas, adrenal glands, or spleen. No hypermetabolic lymph nodes in the abdomen or pelvis.   Incidental CT findings: Nonobstructing staghorn calculus again noted right kidney, similar to prior studies. Left colonic diverticulosis without diverticulitis.   SKELETON: No focal hypermetabolic activity to suggest skeletal metastasis.   Incidental CT findings: Bilateral sacral insufficiency fractures stable since 04/30/2024.   IMPRESSION: 1. 7 mm short axis left axillary node identified on recent CT of 04/30/2024 is hypermetabolic with SUV max = 7.4. This lymph node was 7 mm short axis on the 01/19/2024 CT exam and 4-5 mm short axis on the PET-CT from 02/10/2023. While relatively stable in size since 01/19/2024, given persistence and associated hypermetabolism, metastatic disease a concern. 2. No other suspicious hypermetabolic disease on today's study. 3.  Aortic Atherosclerosis (ICD10-I70.0).   Electronically Signed: By: Morgan Dalton M.D. On: 08/17/2024 05:21  ADDENDUM: As noted in the  body of the report, ascending thoracic aorta measures up to 4.3 cm diameter. Recommend annual imaging followup by CTA or MRA. This recommendation follows 2010 ACCF/AHA/AATS/ACR/ASA/SCA/SCAI/SIR/STS/SVM Guidelines for the Diagnosis and Management of Patients with Thoracic Aortic Disease. Circulation. 2010; 121: [id number]. Aortic aneurysm NOS (ICD10-I71.9)    Assessment:  Morgan Dalton is a 73 y.o. female diagnosed with grade 1 (p53wt, ER+, pMMR) deeply invasive endometrioid endometrial cancer in 2009, isolated vaginal recurrence status post radiation therapy and continues letrozole . May 2025 CT noted  prominent 7 mm left axillary LN but otherwise no evidence of disease. 08/08/24 PET was independently reviewed and left axillary LN is hypermetabolic. No evidence of recurrent disease otherwise. Tolerating letrozole  well and appears to be having an excellent response.   Vaginal atrophy- severe radiation changes, with vaginal stenosis improved with dilator therapy, and atrophy with intermitent spotting. No evidence of vaginal recurrence on exam or imaging.   Osteoporosis- on prolia per endocrinology.   Low back Pain with history of right sacral wing insufficiency fracture- resolved.   Thoracic aortic aneursym- measuring 4.3 cm in diameter. New.   History 3 mm pulmonary nodule- considered benign. Not hypermetabolic on pet or changed in size.   Left lower pole caliectasis with a 2.1 cm staghorn type calculus, asymptomatic  Left lower extremity swelling- negative for dvt May 2025. Followed by vascular.   Medical co-morbidities complicating care: diverticulitis, hypothyroid.  Plan:   Problem List Items Addressed This Visit   None   Continue letrozole  2.5 mg daily.  She will continue to have bone density scans per endocrinology.  Recommend repeating in 2026 or 2027.  She will continue Prolia along with calcium and vitamin D as recommended.  Encouraged weightbearing exercise as tolerated.  We had  previously discussed that if her Pap was completely negative and she was symptomatic of the AI then we would consider a treatment holiday.  However, she continues to have persistent hypermetabolism of the biopsy-proven malignancy in the left axillary lymph node.  It is stable in size measuring 7 mm.  No other suspicious hypermetabolic disease on today's study.  Her mammograms are up-to-date and mammogram does not appreciate this lymph node.  I am not able to appreciate the lymph node on exam either.  Therefore, I recommend continuing letrozole .  She is in agreement.  Sacral insufficiency fracture-follow-up with endocrinology for Prolia, calcium, vitamin D and management of osteoporosis.  3 mm pulmonary nodule-continues to be stable on imaging.  Suspect benign etiology.  Will monitor on follow-up imaging.  Left lower extremity edema-negative for DVT.  She is now followed by vascular for lymphedema.  Unable to tolerate compression garments.  She is gena try a different brand of compression garments.  Other options would include laser therapy.  She is not scheduled for follow-up with vascular currently.  She can return to them if she would like to consider laser therapy or has other complaints.  Aortic aneurysm-new on PET scan.  Measures 4.3 cm in diameter.  I reached out to vascular surgery who recommends that no intervention is recommended until it measures greater than 6 cm.  Will plan to evaluate with imaging annually.  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 Precautions provided.   Disposition: 3 months CT chest/abdomen/pelvis See Dr. Elby few days to a week later for results - la  The patient's diagnosis, an outline of the further diagnostic and laboratory studies which will be required, the recommendation for surgery, and alternatives were discussed with her.  All questions were answered to their satisfaction.   Tinnie Dawn, DNP, AGNP-C, AOCNP [facility] [phone] (clinic)

## 2024-08-22 NOTE — Progress Notes (Signed)
 Patient was seen in procedure room 1 of the [facility] GYN ONC Department., where I assisted with pelvic exam. I, Rayfield Jasmine RN, was present as chaperone for the sensitive portion of the exam.

## 2024-08-27 ENCOUNTER — Encounter: Payer: Self-pay | Admitting: Nurse Practitioner

## 2024-08-29 ENCOUNTER — Other Ambulatory Visit: Payer: Self-pay

## 2024-08-30 ENCOUNTER — Ambulatory Visit: Payer: Medicare Other | Admitting: Dermatology

## 2024-09-04 ENCOUNTER — Other Ambulatory Visit

## 2024-09-04 DIAGNOSIS — I7121 Aneurysm of the ascending aorta, without rupture: Secondary | ICD-10-CM | POA: Insufficient documentation

## 2024-09-17 ENCOUNTER — Encounter: Payer: Self-pay | Admitting: Dermatology

## 2024-09-17 ENCOUNTER — Ambulatory Visit: Admitting: Dermatology

## 2024-09-17 DIAGNOSIS — W908XXA Exposure to other nonionizing radiation, initial encounter: Secondary | ICD-10-CM

## 2024-09-17 DIAGNOSIS — Z8589 Personal history of malignant neoplasm of other organs and systems: Secondary | ICD-10-CM

## 2024-09-17 DIAGNOSIS — Z85828 Personal history of other malignant neoplasm of skin: Secondary | ICD-10-CM

## 2024-09-17 DIAGNOSIS — Z1283 Encounter for screening for malignant neoplasm of skin: Secondary | ICD-10-CM

## 2024-09-17 DIAGNOSIS — L814 Other melanin hyperpigmentation: Secondary | ICD-10-CM

## 2024-09-17 DIAGNOSIS — L820 Inflamed seborrheic keratosis: Secondary | ICD-10-CM

## 2024-09-17 DIAGNOSIS — Z86018 Personal history of other benign neoplasm: Secondary | ICD-10-CM

## 2024-09-17 DIAGNOSIS — L570 Actinic keratosis: Secondary | ICD-10-CM

## 2024-09-17 DIAGNOSIS — L821 Other seborrheic keratosis: Secondary | ICD-10-CM

## 2024-09-17 DIAGNOSIS — D229 Melanocytic nevi, unspecified: Secondary | ICD-10-CM

## 2024-09-17 DIAGNOSIS — L578 Other skin changes due to chronic exposure to nonionizing radiation: Secondary | ICD-10-CM | POA: Diagnosis not present

## 2024-09-17 DIAGNOSIS — D1801 Hemangioma of skin and subcutaneous tissue: Secondary | ICD-10-CM

## 2024-09-17 NOTE — Patient Instructions (Addendum)
 Cryotherapy Aftercare  Wash gently with soap and water everyday.   Apply Vaseline and Band-Aid daily until healed.    Recommend daily broad spectrum sunscreen SPF 30+ to sun-exposed areas, reapply every 2 hours as needed. Call for new or changing lesions.  Staying in the shade or wearing long sleeves, sun glasses (UVA+UVB protection) and wide brim hats (4-inch brim around the entire circumference of the hat) are also recommended for sun protection.     Due to recent changes in healthcare laws, you may see results of your pathology and/or laboratory studies on MyChart before the doctors have had a chance to review them. We understand that in some cases there may be results that are confusing or concerning to you. Please understand that not all results are received at the same time and often the doctors may need to interpret multiple results in order to provide you with the best plan of care or course of treatment. Therefore, we ask that you please give us  2 business days to thoroughly review all your results before contacting the office for clarification. Should we see a critical lab result, you will be contacted sooner.   If You Need Anything After Your Visit  If you have any questions or concerns for your doctor, please call our main line at [phone] and press option 4 to reach your doctor's medical assistant. If no one answers, please leave a voicemail as directed and we will return your call as soon as possible. Messages left after 4 pm will be answered the following business day.   You may also send us  a message via MyChart. We typically respond to MyChart messages within 1-2 business days.  For prescription refills, please ask your pharmacy to contact our office. Our fax number is [phone].  If you have an urgent issue when the clinic is closed that cannot wait until the next business day, you can page your doctor at the number below.    Please note that while we do our best to  be available for urgent issues outside of office hours, we are not available 24/7.   If you have an urgent issue and are unable to reach us , you may choose to seek medical care at your doctor's office, retail clinic, urgent care center, or emergency room.  If you have a medical emergency, please immediately call 911 or go to the emergency department.  Pager Numbers  - Dr. Hester: [phone]  - Dr. Jackquline: [phone]  - Dr. Claudene: [phone]   - Dr. Raymund: [phone]  In the event of inclement weather, please call our main line at [phone] for an update on the status of any delays or closures.  Dermatology Medication Tips: Please keep the boxes that topical medications come in in order to help keep track of the instructions about where and how to use these. Pharmacies typically print the medication instructions only on the boxes and not directly on the medication tubes.   If your medication is too expensive, please contact our office at [phone] option 4 or send us  a message through MyChart.   We are unable to tell what your co-pay for medications will be in advance as this is different depending on your insurance coverage. However, we may be able to find a substitute medication at lower cost or fill out paperwork to get insurance to cover a needed medication.   If a prior authorization is required to get your medication covered by your insurance company, please allow us  1-2  business days to complete this process.  Drug prices often vary depending on where the prescription is filled and some pharmacies may offer cheaper prices.  The website www.goodrx.com contains coupons for medications through different pharmacies. The prices here do not account for what the cost may be with help from insurance (it may be cheaper with your insurance), but the website can give you the price if you did not use any insurance.  - You can print the associated coupon and take it with your  prescription to the pharmacy.  - You may also stop by our office during regular business hours and pick up a GoodRx coupon card.  - If you need your prescription sent electronically to a different pharmacy, notify our office through [facility] or by phone at [phone] option 4.     Si Usted Necesita Algo Despus de Su Visita  Tambin puede enviarnos un mensaje a travs de Clinical cytogeneticist. Por lo general respondemos a los mensajes de MyChart en el transcurso de 1 a 2 das hbiles.  Para renovar recetas, por favor pida a su farmacia que se ponga en contacto con nuestra oficina. Randi lakes de fax es [place] [phone].  Si tiene un asunto urgente cuando la clnica est cerrada y que no puede esperar hasta el siguiente da hbil, puede llamar/localizar a su doctor(a) al nmero que aparece a continuacin.   Por favor, tenga en cuenta que aunque hacemos todo lo posible para estar disponibles para asuntos urgentes fuera del horario de [place], no estamos disponibles las 24 horas del da, los 7 [address]  [address] de la [place].   Si tiene un problema urgente y no puede comunicarse con nosotros, puede optar por buscar atencin mdica  en el consultorio de su doctor(a), en una clnica privada, en un centro de atencin urgente o en una sala de emergencias.  Si tiene Engineer, drilling, por favor llame inmediatamente al 911 o vaya a la sala de emergencias.  Nmeros de bper  - Dr. Hester: [phone]  - Dra. Jackquline: [phone]  - Dr. Claudene: [phone]  - Dra. Kitts: [phone]  En caso de inclemencias del [place], por favor llame a nuestra lnea principal al [phone] para una actualizacin sobre el estado de cualquier retraso o cierre.  Consejos para la medicacin en dermatologa: Por favor, guarde las cajas en las que vienen los medicamentos de uso tpico para ayudarle a seguir las instrucciones sobre dnde y cmo usarlos. Las farmacias generalmente imprimen las instrucciones del  medicamento slo en las cajas y no directamente en los tubos del [place].   Si su medicamento es muy caro, por favor, pngase en contacto con landry rieger llamando al [phone] y presione la opcin 4 o envenos un mensaje a travs de Clinical cytogeneticist.   No podemos decirle cul ser su copago por los medicamentos por adelantado ya que esto es diferente dependiendo de la cobertura de su seguro. Sin embargo, es posible que podamos encontrar un medicamento sustituto a Audiological scientist un formulario para que el seguro cubra el medicamento que se considera necesario.   Si se requiere una autorizacin previa para que su compaa de seguros malta su medicamento, por favor permtanos de 1 a 2 das hbiles para completar este proceso.  Los precios de los medicamentos varan con frecuencia dependiendo del Environmental consultant de dnde se surte la receta y alguna farmacias pueden ofrecer precios ms baratos.  El sitio web www.goodrx.com tiene cupones para medicamentos de Health and safety inspector. Los precios aqu no tienen en cuenta  lo que podra costar con la ayuda del seguro (puede ser ms barato con su seguro), pero el sitio web puede darle el precio si no Visual merchandiser.  - Puede imprimir el cupn correspondiente y llevarlo con su receta a la farmacia.  - Tambin puede pasar por nuestra oficina durante el horario de atencin regular y Education officer, museum una tarjeta de cupones de GoodRx.  - Si necesita que su receta se enve electrnicamente a una farmacia diferente, informe a nuestra oficina a travs de MyChart de [place] o por telfono llamando al [phone] y presione la opcin 4.

## 2024-09-17 NOTE — Progress Notes (Signed)
 Follow-Up Visit   Subjective  Morgan Dalton is a 73 y.o. female who presents for the following: Skin Cancer Screening and Full Body Skin Exam, hx of SCC, hx of Dysplastic nevus   The patient presents for Total-Body Skin Exam (TBSE) for skin cancer screening and mole check. The patient has spots, moles and lesions to be evaluated, some may be new or changing and the patient may have concern these could be cancer.   The following portions of the chart were reviewed this encounter and updated as appropriate: medications, allergies, medical history  Review of Systems:  No other skin or systemic complaints except as noted in HPI or Assessment and Plan.  Objective  Well appearing patient in no apparent distress; mood and affect are within normal limits.  A full examination was performed including scalp, head, eyes, ears, nose, lips, neck, chest, axillae, abdomen, back, buttocks, bilateral upper extremities, bilateral lower extremities, hands, feet, fingers, toes, fingernails, and toenails. All findings within normal limits unless otherwise noted below.   Relevant physical exam findings are noted in the Assessment and Plan.  right temple x 1 Erythematous thin papules/macules with gritty scale.  Right Breast x 1, left back x 19, legs x 7 (27) Stuck-on, waxy, tan-brown papules and plaques -- Discussed benign etiology and prognosis.   Assessment & Plan   SKIN CANCER SCREENING PERFORMED TODAY.  LENTIGINES, SEBORRHEIC KERATOSES, HEMANGIOMAS - Benign normal skin lesions - Benign-appearing - Call for any changes  MELANOCYTIC NEVI - Tan-brown and/or pink-flesh-colored symmetric macules and papules - Benign appearing on exam today - Observation - Call clinic for new or changing moles - Recommend daily use of broad spectrum spf 30+ sunscreen to sun-exposed areas.   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  - Recommend staying in the shade or wearing long sleeves, sun glasses (UVA+UVB protection) and wide brim hats (4-inch brim around the entire circumference of the hat). - Call for new or changing lesions.  Destruction of lesion - right temple x 1 Complexity: simple   Destruction method: cryotherapy   Informed consent: discussed and consent obtained   Timeout:  patient name, date of birth, surgical site, and procedure verified Lesion destroyed using liquid nitrogen: Yes   Region frozen until ice ball extended beyond lesion: Yes   Outcome: patient tolerated procedure well with no complications   Post-procedure details: wound care instructions given    INFLAMED SEBORRHEIC KERATOSIS (27) Right Breast x 1, left back x 19, legs x 7 (27) Symptomatic, irritating, patient would like treated.  Destruction of lesion - Right Breast x 1, left back x 19,  legs x 7 (27) Complexity: simple   Destruction method: cryotherapy   Informed consent: discussed and consent obtained   Timeout:  patient name, date of birth, surgical site, and procedure verified Lesion destroyed  using liquid nitrogen: Yes   Region frozen until ice ball extended beyond lesion: Yes   Outcome: patient tolerated procedure well with no complications   Post-procedure details: wound care instructions given     Return in about 1 year (around 09/17/2025) for TBSE, hx of SCC, hx of Dysplastic nevus .  IFay Kirks, CMA, am acting as scribe for Alm Rhyme, MD .   Documentation: I have reviewed the above documentation for accuracy and completeness, and I agree with the above.  Alm Rhyme, MD

## 2024-09-18 ENCOUNTER — Other Ambulatory Visit: Payer: Self-pay

## 2024-09-18 MED ORDER — FLUZONE 0.5 ML IM SUSY
0.5000 mL | PREFILLED_SYRINGE | Freq: Once | INTRAMUSCULAR | 0 refills | Status: AC
  Filled 2024-09-18: qty 0.5, 1d supply, fill #0

## 2024-09-19 ENCOUNTER — Ambulatory Visit

## 2024-10-26 ENCOUNTER — Telehealth: Payer: Self-pay | Admitting: Obstetrics and Gynecology

## 2024-10-26 NOTE — Telephone Encounter (Signed)
 Called pt to confirm CT appt - pt states date/time/location still work - [facility]

## 2024-10-29 DIAGNOSIS — I77810 Thoracic aortic ectasia: Secondary | ICD-10-CM | POA: Insufficient documentation

## 2024-10-30 ENCOUNTER — Other Ambulatory Visit: Payer: Self-pay

## 2024-10-30 MED ORDER — AREXVY 120 MCG/0.5ML IM SUSR
0.5000 mL | Freq: Once | INTRAMUSCULAR | 0 refills | Status: AC
  Filled 2024-10-30: qty 0.5, 1d supply, fill #0

## 2024-10-31 ENCOUNTER — Ambulatory Visit
Admission: RE | Admit: 2024-10-31 | Discharge: 2024-10-31 | Disposition: A | Discharge status: Home / Self Care | Source: Ambulatory Visit | Attending: Nurse Practitioner | Admitting: Nurse Practitioner

## 2024-10-31 DIAGNOSIS — C541 Malignant neoplasm of endometrium: Secondary | ICD-10-CM | POA: Diagnosis present

## 2024-10-31 MED ORDER — IOHEXOL 300 MG/ML  SOLN
100.0000 mL | Freq: Once | INTRAMUSCULAR | Status: AC | PRN
  Administered 2024-10-31: 100 mL via INTRAVENOUS

## 2024-11-01 ENCOUNTER — Encounter: Payer: Self-pay | Admitting: Radiation Oncology

## 2024-11-01 ENCOUNTER — Ambulatory Visit
Admission: RE | Admit: 2024-11-01 | Discharge: 2024-11-01 | Disposition: A | Discharge status: Home / Self Care | Source: Ambulatory Visit | Attending: Radiation Oncology | Admitting: Radiation Oncology

## 2024-11-01 VITALS — BP 146/81 | HR 68 | Temp 97.8°F | Resp 15 | Ht 64.0 in | Wt 203.5 lb

## 2024-11-01 DIAGNOSIS — C541 Malignant neoplasm of endometrium: Secondary | ICD-10-CM | POA: Insufficient documentation

## 2024-11-01 DIAGNOSIS — R590 Localized enlarged lymph nodes: Secondary | ICD-10-CM | POA: Diagnosis not present

## 2024-11-01 DIAGNOSIS — Z923 Personal history of irradiation: Secondary | ICD-10-CM | POA: Insufficient documentation

## 2024-11-01 NOTE — Progress Notes (Signed)
 Radiation Oncology Follow up Note  Name: Morgan Dalton   Date:   11/01/2024 MRN:  [id number] DOB: 1951/11/11    This 73 y.o. 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  She specifically denies any increased lower Neri tract symptoms diarrhea or fatigue.  She does have a PET positive node in her left axilla which has been slightly increasing over time.  She is can have a discussion with Dr. Elby about that in early December..  She had a PET scan which I have reviewed back in August showing only a hypermetabolic left axillary node no other evidence of metastatic disease.  CT scan of the chest was done yesterday is not been formally read although on my interpretation there might be slight increase in size of the left axillary node.  No other evidence of disease was noted.  COMPLICATIONS OF TREATMENT: none  FOLLOW UP COMPLIANCE: keeps appointments   PHYSICAL EXAM:  BP (!) 146/81   Pulse 68   Temp 97.8 F (36.6 C) (Tympanic)   Resp 15   Ht 5' 4 (1.626 m)   Wt 203 lb 8 oz (92.3 kg)   BMI 34.93 kg/m  Well-developed well-nourished patient in NAD. HEENT reveals PERLA, EOMI, discs not visualized.  Oral cavity is clear. No oral mucosal lesions are identified. Neck is clear without evidence of cervical or supraclavicular adenopathy. Lungs are clear to A&P. Cardiac examination is essentially unremarkable with regular rate and rhythm without murmur rub or thrill. Abdomen is benign with no organomegaly or masses noted. Motor sensory and DTR levels are equal and  symmetric in the upper and lower extremities. Cranial nerves II through XII are grossly intact. Proprioception is intact. No peripheral adenopathy or edema is identified. No motor or sensory levels are noted. Crude visual fields are within normal range.  RADIOLOGY RESULTS: CT scans and PET CT scan reviewed compatible with above-stated findings  PLAN: This time she will see Dr. Elby early December.  May want a go ahead with a ultrasound-guided needle biopsy of that left axillary node.  Treatment options would include SBRT to that node if it is positive for metastatic disease as the only site.  I have otherwise asked to see the patient back in 6 months for follow-up.  Patient knows to call with any concerns.  I would like to take this opportunity to thank you for allowing me to participate in the care of your patient.SABRA Marcey Penton, MD

## 2024-11-21 ENCOUNTER — Telehealth: Payer: Self-pay | Admitting: Obstetrics and Gynecology

## 2024-11-21 ENCOUNTER — Encounter: Payer: Self-pay | Admitting: Obstetrics and Gynecology

## 2024-11-21 ENCOUNTER — Inpatient Hospital Stay: Attending: Obstetrics and Gynecology | Admitting: Obstetrics and Gynecology

## 2024-11-21 VITALS — BP 106/71 | HR 73 | Temp 97.0°F | Ht 64.0 in | Wt 201.0 lb

## 2024-11-21 DIAGNOSIS — Z90722 Acquired absence of ovaries, bilateral: Secondary | ICD-10-CM | POA: Diagnosis not present

## 2024-11-21 DIAGNOSIS — Z8542 Personal history of malignant neoplasm of other parts of uterus: Secondary | ICD-10-CM | POA: Insufficient documentation

## 2024-11-21 DIAGNOSIS — Z5181 Encounter for therapeutic drug level monitoring: Secondary | ICD-10-CM

## 2024-11-21 DIAGNOSIS — Z90710 Acquired absence of both cervix and uterus: Secondary | ICD-10-CM | POA: Diagnosis not present

## 2024-11-21 DIAGNOSIS — C786 Secondary malignant neoplasm of retroperitoneum and peritoneum: Secondary | ICD-10-CM | POA: Diagnosis present

## 2024-11-21 DIAGNOSIS — R911 Solitary pulmonary nodule: Secondary | ICD-10-CM

## 2024-11-21 DIAGNOSIS — Z923 Personal history of irradiation: Secondary | ICD-10-CM | POA: Diagnosis not present

## 2024-11-21 DIAGNOSIS — C541 Malignant neoplasm of endometrium: Secondary | ICD-10-CM

## 2024-11-21 DIAGNOSIS — Z79811 Long term (current) use of aromatase inhibitors: Secondary | ICD-10-CM | POA: Diagnosis not present

## 2024-11-21 DIAGNOSIS — Z9079 Acquired absence of other genital organ(s): Secondary | ICD-10-CM | POA: Diagnosis not present

## 2024-11-21 NOTE — Progress Notes (Signed)
 Gynecologic Oncology Interval Visit   Referring Provider: 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 female who is seen in consultation from [name] with history of endometrial cancer in 2009, with incidental right pelvic mass in 01/2023, consistent with recurrent disease, s/p radiation, now on letrozole  since August 2024, who returns to clinic for follow up and discussion of imaging results.   She is doing very well with letrozole  and has no complaints  11/04/2024 CT C/A/P   Ascending thoracic aorta measures 37 mm when measured in coronal projection (image 52 series 4). Heart and pericardium are unremarkable. The central airways are clear. No mediastinal lymphadenopathy. Rounded left axillary lymph node measures 8 mm (image 21 of series 2) compared to 8 mm on CT 04/30/2024. No hilar lymphadenopathy.  IMPRESSION: 1. No change in size of left axillary lymph node. 2. No evidence of recurrent or progressive cervical carcinoma. 3. Normal ascending thoracic aortic diameter when remeasured.  Treatment Summary:  2009- vaginal hysterectomy with Dr Lovetta at [facility]. Pathology consistent with endometrial adenocarcinoma 02/02/2008- underwent RA BSO, bilateral pelvic LN dissection, supraumbilical laparotomy, periaortic nodes with [name] at [facility]. Stage Ic. Elected for adjuvant vaginal brachytherapy 01/21/23- ER for abdominal pain. Solid mass 3.2 x 2.2 04/07/2023-05/12/2023 - total dose with 50 Gy completed radiation. 07/20/2023- started letrozole   10/12/23- CT - response to therapy. New sclerosis of right sacral wing.  10/21/23- MRI Lumbar & Sacrum - vertical fracture of right sacral ala with edema and enhancement.  12/24 - diagnosed with osteoporosis and started on Prolia.  See HPI for further details    Last colonoscopy - 12/16/23 Last mammogram 03/12/24    Gynecologic Oncology History:   She underwent vaginal hysterectomy with Dr Lovetta at [facility] in 2009. 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 She was referred to Dr. Cleotilde at [facility] and underwent robotic assisted bilateral salpino-oophorectomy, bilateral pelvic lymph node dissection, supraumbilical laparotomy and periaortic lymph node dissection. Surgery complicated by adhesions. Final pathology revealed a deeply invasive (two thirds), well differentiated endometrioid adenocarcinoma, stage Ic. Washings cervix and adnexa were negative. She elected for adjuvant vaginal brachytherapy at [facility] and completed treatment 02/02/2008.   Patient went to ER for RUQ abdominal pain. CT 01/21/23 showed diverticulitis at hepatic flexure with some free air. Treated with antibiotics for diverticulitis.  Incidental finding of solid mass of right posterior adnexa measuring 3.2 x 2.2 cm, new since previous CT scan in 2020. She also has a large staghorn calculus in the right kidney.  She was seen by [name] in [place].   Patient provided us  with her records from her endometrial cancer which were reviewed in detail, copied, and scanned into her chart.  Last colonoscopy 11/07/2018- report reviewed and showed tortuous colon, diverticulosis, recommendation to repeat in 5 years (2024)  01/30/23- MRI Abdomen and Pelvis w wo contrast S/p hysterectomy. Left ovary not visualized. Solid heterogeneously enhancing right ovarian lesion measuring 3.0 x 1.9 x 2.3 cm. No ascites. No lymphadenopathy. Inflammation of proximal transverse colon consistent with residual diverticulitis.  MRI was addended to reflect that mass was present on CT from 2020 and measured 1.6 x 1.2 cm at that time. No definite communication with the colon or small bowel loops and the appendix is identified as separate from this lesion.   HE4- 142 (elevated) CA 125 - 18 (normal) ROMA- 30.57% (high risk)  01/25/23- Pap- NILM  A PET scan  was performed to  better ascertain the etiology of the mass.  02/10/23- PET 1. Hypermetabolic solid mass in the RIGHT pelvis consistent with ovarian neoplasm. 2. No evidence of metastatic adenopathy in the pelvis or periaortic retroperitoneum. 3. No evidence of metastatic disease outside the pelvis. 4. Post hysterectomy anatomy. No abnormal metabolic activity associated with the vagina. 5. RIGHT renal staghorn calculus.  Imaging was reviewed by Dr. Malva. Report was addended to reflect that based on her prior surgical hysterectomy and oophorectomy, intensely hypermetabolic solid mass is favored to be a neoplastic deposit, potentially from remote endometrial carcinoma.   03/04/23 Image guided biopsy of right pelvic mass. DIAGNOSIS: A. SOFT TISSUE, RIGHT ADNEXAL; CT-GUIDED CORE NEEDLE BIOPSY: - POSITIVE FOR MALIGNANCY. - METASTATIC ADENOCARCINOMA, COMPATIBLE WITH ENDOMETRIOID ADENOCARCINOMA.  Comment: The patient's prior history of endometrioid adenocarcinoma in 2009 is noted.  Biopsy sections display an abnormal cribriform and somewhat papillary glandular proliferation, histologically compatible with the patient's previously diagnosed endometrioid adenocarcinoma.  Immunohistochemical studies show tumor cells to be positive for pancytokeratin, PAX8, estrogen receptor (greater than 90%, strong), and progesterone receptor (greater than 90%, strong).  Tumor cells are negative for CK7, WT1, CDX2, and Napsin A.  P53 demonstrates a wild-type (non-mutated) pattern of expression.  Taken together, the morphologic and immunophenotypic findings support the above diagnosis.  Tumor cells are positive for estrogen receptor (greater than 90%,  strong) and progesterone receptor (greater than 90%, strong). Tumor cells display wild-type (9 mutated) expression of p53.   DNA Mismatch Repair (MMR)  MLH1: Intact nuclear expression  MSH2: Intact nuclear expression  MSH6: Intact nuclear expression  PMS2: Intact nuclear expression    04/07/2023-05/12/2023 total dose with 50 Gy completed radiation.   06/24/2023 CT A/P Findings: The lesion centered just superior and lateral to the right side of the vaginal cuff measures 2.0 x 1.6 cm on 72/2. It is centrally hypoattenuating today (presumably secondary to necrosis). This makes differentiation from surrounding bowel loops challenging. Decreased in size from 3.2 x 2.2 cm on the prior CT. IMPRESSION: 1. Response to therapy of right pelvic mass, as evidenced by decrease in size and development of central necrosis. 2. No new or progressive disease. 3. Similar right lower pole staghorn type renal calculus. 4. Hepatomegaly and possible hepatic steatosis. 5.  Aortic Atherosclerosis (ICD10-I70.0).  07/20/2023 She was counseled about Megace versus letrozole . She opted for letrozole .   We reviewed her last DEXA which was DEXA 08/09/2022 INTERPRETATION:  Osteopenia/Low bone mass. No significant change in bone mineral density from  08-08-2020.   She is followed by her primary care provider.  With regard to the osteopenia he recommended calcium 300 mg once daily and Vit D 1000U daily.    10/12/2023 CT A/P CT ABDOMEN AND PELVIS WITH CONTRAST IMPRESSION: 1. Interval decrease in size of right posterior pelvic sidewall soft tissue nodule. 2. New asymmetric sclerosis within the right sacral wing. This is somewhat ill-defined. Findings may be related to insufficiency fracture or less likely osseous metastasis. Consider further evaluation with contrast enhanced MRI of the pelvis. 3. Stable 3 cm staghorn calculus within the inferior pole of the right kidney. 4. Colonic diverticulosis without signs of acute diverticulitis. 5.  Aortic Atherosclerosis (ICD10-I70.0).  11/29/23 - DEXA diagnosed with osteoporosis and started on Prolia.   CT scan 01/19/2024  CT C/A/P IMPRESSION: 1. Similar size of the right posterior pelvic soft tissue mass which is difficult to definitively assess given its small size  and close approximation with the pelvic structures. 2. 3 mm  pulmonary nodule in the right lung apex common nonspecific. Suggest attention on short-term interval follow-up chest CT. 3. No evidence of new or progressive disease in the abdomen or pelvis. 4. Left lower pole caliectasis with a 2.1 cm staghorn type calculus. 5. Sacral insufficiency fracture again seen. 6.  Aortic Atherosclerosis (ICD10-I70.0).  CT scan 04/30/2024  CT C/A/P IMPRESSION: 1. Status post hysterectomy. No well-defined residual pelvic mass. No new or progressive disease. 2. Similar right apical 3 mm ground-glass nodule, nonspecific but of doubtful clinical significance. 3. Incidental findings, including: Coronary artery atherosclerosis. Aortic Atherosclerosis (ICD10-I70.0). Right-sided lower pole staghorn type calculus causing similar mild caliectasis. 4. Possible bladder wall thickening. Correlate with symptoms of cystitis.  CT scan 08/08/2024  CT C/A/P IMPRESSION: 1. 7 mm short axis left axillary node identified on recent CT of 04/30/2024 is hypermetabolic with SUV max = 7.4. This lymph node was 7 mm short axis on the 01/19/2024 CT exam and 4-5 mm short axis on the PET-CT from 02/10/2023. While relatively stable in size since 01/19/2024, given persistence and associated hypermetabolism, metastatic disease a concern. 2. No other suspicious hypermetabolic disease on today's study. 3.  Aortic Atherosclerosis (ICD10-I70.0)  08/08/24- PET Restaging CHEST: No hypermetabolic mediastinal or hilar nodes. 7 mm short axis left axillary node identified on recent CT of 04/30/2024 is hypermetabolic with SUV max = 7.4. This lymph node with 7 mm short axis on the 01/19/2024 exam and 4-5 mm short axis on the PET-CT from 02/10/2023. No suspicious pulmonary nodules on the CT scan.   Incidental CT findings: Ascending thoracic aorta measures up to 4.3 cm diameter. Mild atherosclerotic calcification is noted in the wall of the  thoracic aorta. Calcified granuloma noted left lower lobe. Centrilobular emphsyema noted. Tiny right apical ground-glass nodule seen on previous CT of 04/30/2024 not readily evident by PET-CT today.    Incidental CT findings: Bilateral sacral insufficiency fractures stable since 04/30/2024.   IMPRESSION: 1. 7 mm short axis left axillary node identified on recent CT of 04/30/2024 is hypermetabolic with SUV max = 7.4. This lymph node was 7 mm short axis on the 01/19/2024 CT exam and 4-5 mm short axis on the PET-CT from 02/10/2023. While relatively stable in size since 01/19/2024, given persistence and associated hypermetabolism, metastatic disease a concern. 2. No other suspicious hypermetabolic disease on today's study. 3.  Aortic Atherosclerosis (ICD10-I70.0).   ADDENDUM: As noted in the body of the report, ascending thoracic aorta measures up to 4.3 cm diameter. Recommend annual imaging followup by CTA or MRA. This recommendation follows 2010 ACCF/AHA/AATS/ACR/ASA/SCA/SCAI/SIR/STS/SVM Guidelines for the Diagnosis and Management of Patients with Thoracic Aortic Disease. Circulation. 2010; 121: [id number]. Aortic aneurysm NOS (ICD10-I71.9)    Problem List: Patient Active Problem List   Diagnosis Date Noted   Aneurysm of ascending aorta without rupture 09/04/2024   Prediabetes 04/23/2024   Endometrial cancer (HCC) 03/20/2023   Benign essential hypertension 01/27/2023   Diverticulitis 01/26/2023   Thrombocytosis 11/29/2019   GERD (gastroesophageal reflux disease) 12/31/2016   History of nephrolithiasis 12/31/2016   Hyperlipidemia, unspecified 12/31/2016   Insomnia 12/31/2016   Osteopenia 12/31/2016   Acquired hypothyroidism 10/26/2016   Bladder neoplasm of uncertain malignant potential 09/09/2016   Bladder pain 08/11/2016   Chronic cystitis 08/11/2016   Mild episode of recurrent major depressive disorder 07/23/2016   History of endometrial cancer 01/23/2016   Recurrent carcinoma of  endometrium (HCC) 01/23/2016   Renal colic 06/19/2014   Right upper quadrant pain 08/07/2012   Kidney stone 08/07/2012  Gross hematuria 08/07/2012   Malignant neoplasm of corpus uteri (HCC) 08/07/2012   Microscopic hematuria 08/07/2012   Urinary tract infection 08/07/2012    Past Medical History: Past Medical History:  Diagnosis Date   Ankle fracture, left    Anxiety    Cancer (HCC)    Uterine Cancer   Dyspepsia    Endometrial cancer (HCC)    GERD (gastroesophageal reflux disease)    History of kidney stones    Hx of dysplastic nevus 10/16/2010   Right lower leg. Severe atypia. Excised: 11/25/2010. 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 Well differentiated. Tx: [facility]    Past Surgical History: Past Surgical History:  Procedure Laterality Date   ABDOMINAL HYSTERECTOMY     CATARACT EXTRACTION W/PHACO Right 12/07/2016   Procedure: CATARACT EXTRACTION PHACO AND INTRAOCULAR LENS PLACEMENT (IOC);  Surgeon: Elsie Carmine, MD;  Location: [facility] ORS;  Service: Ophthalmology;  Laterality: Right;  US  42.5 AP% 23.2 CDE 9.87 Fluid pack lot # [id number]   CATARACT EXTRACTION W/PHACO Left 01/04/2017   Procedure: CATARACT EXTRACTION PHACO AND INTRAOCULAR LENS PLACEMENT (IOC);  Surgeon: Elsie Carmine, MD;  Location: [facility] ORS;  Service: Ophthalmology;  Laterality: Left;  US  39.2 AP% 23.9 CDE 9.38 Fluid Pack Lot # [id number]   CHOLECYSTECTOMY  2014   COLONOSCOPY  2014   COLONOSCOPY WITH ESOPHAGOGASTRODUODENOSCOPY (EGD)  2004   COLONOSCOPY WITH PROPOFOL  N/A 11/07/2018   Procedure: COLONOSCOPY WITH PROPOFOL ;  Surgeon: Gaylyn Gladis PENNER, MD;  Location: [facility] ENDOSCOPY;  Service: Endoscopy;  Laterality: N/A;   COLONOSCOPY WITH PROPOFOL  N/A 12/16/2023   Procedure: COLONOSCOPY WITH PROPOFOL ;  Surgeon: Maryruth Ole DASEN, MD;  Location: [facility] ENDOSCOPY;   Service: Endoscopy;  Laterality: N/A;   DILATION AND CURETTAGE OF UTERUS     EXTRACORPOREAL SHOCK WAVE LITHOTRIPSY     EYE SURGERY     GANGLION CYST EXCISION Right    wrist   HALLUX VALGUS LAPIDUS Left 08/11/2022   Procedure: HALLUX VALGUS LAPIDUS;  Surgeon: Ashley Soulier, DPM;  Location: [facility] ORS;  Service: Podiatry;  Laterality: Left;   KIDNEY STONE SURGERY     several   KNEE ARTHROSCOPY Left 2014   partial medial meniscectomy, medial chondroplasty   NM PET TUMOR IMAGING  02/07/2023   TOTAL ABDOMINAL HYSTERECTOMY W/ BILATERAL SALPINGOOPHORECTOMY  2009    Past Gynecologic History:  G2P2 Post menopausal Menarche: age 53  OB History:  OB History  No obstetric history on file.   Family History: Family History  Problem Relation Age of Onset   Breast cancer Paternal Aunt    Social History: Social History   Socioeconomic History   Marital status: Married    Spouse name: Lamar   Number of children: 2   Years of education: Not on file   Highest education level: Not on file  Occupational History   Not on file  Tobacco Use   Smoking status: Never   Smokeless tobacco: Never  Vaping Use   Vaping status: Never Used  Substance and Sexual Activity   Alcohol use: No   Drug use: No   Sexual activity: Not on file  Other Topics Concern   Not on file  Social History Narrative   Not on file   Social Drivers of Health   Financial Resource Strain: Low Risk  (10/30/2024)   Received  from [facility]   Overall Financial Resource Strain (CARDIA)    Difficulty of Paying Living Expenses: Not hard at all  Food Insecurity: No Food Insecurity (10/30/2024)   Received from [facility]   Hunger Vital Sign    Within the past 12 months, you worried that your food would run out before you got the money to buy more.: Never true    Within the past 12 months, the food you bought just didn't last and you didn't have money to get more.: Never true   Transportation Needs: No Transportation Needs (10/30/2024)   Received from [facility] - Transportation    In the past 12 months, has lack of transportation kept you from medical appointments or from getting medications?: No    Lack of Transportation (Non-Medical): No  Physical Activity: Not on file  Stress: Not on file  Social Connections: Not on file  Intimate Partner Violence: Not At Risk (03/18/2023)   Humiliation, Afraid, Rape, and Kick questionnaire    Fear of Current or Ex-Partner: No    Emotionally Abused: No    Physically Abused: No    Sexually Abused: No   Allergies: Allergies  Allergen Reactions   Statins Other (See Comments)    Myalgia    Current Medications: Current Outpatient Medications  Medication Sig Dispense Refill   ascorbic acid (VITAMIN C) 500 MG tablet Take 500 mg by mouth daily. Patient states she takes it October - April.     aspirin EC 81 MG tablet Take 81 mg by mouth daily.      calcium carbonate (OS-CAL - DOSED IN MG OF ELEMENTAL CALCIUM) 1250 (500 Ca) MG tablet Take by mouth.     cholecalciferol (VITAMIN D3) 25 MCG (1000 UNIT) tablet Take 1,000 Units by mouth daily.     Coenzyme Q10 (CO Q-10) 100 MG CAPS Take 100 mg by mouth daily.     Cranberry 500 MG TABS Take by mouth.     ezetimibe (ZETIA) 10 MG tablet Take 10 mg by mouth daily.     Glucosamine-Chondroitin 750-600 MG TABS Take 1 tablet by mouth daily.     hydrochlorothiazide (HYDRODIURIL) 25 MG tablet Take 12.5 mg by mouth daily.     Krill Oil 1000 MG CAPS Take by mouth.     letrozole  (FEMARA ) 2.5 MG tablet Take 1 tablet (2.5 mg total) by mouth daily. 90 tablet 3   levothyroxine (SYNTHROID, LEVOTHROID) 25 MCG tablet Take 25 mcg by mouth daily before breakfast.     losartan (COZAAR) 50 MG tablet Take 1 tablet by mouth daily.     Multiple Vitamins-Minerals (WOMENS 50+ MULTI VITAMIN PO) Take by mouth.     pantoprazole (PROTONIX) 40 MG tablet Take 40 mg by mouth every other  day.     rosuvastatin (CRESTOR) 5 MG tablet Take 5 mg by mouth once a week. Thursday night     sertraline (ZOLOFT) 50 MG tablet Take 50 mg by mouth daily.     No current facility-administered medications for this visit.   Review of Systems General: no complaints  HEENT: no complaints  Lungs: shortness of breath and dry cough; PFTs normal stress test scheduled for tomorrow  Cardiac: no complaints  GI: no complaints  GU: no complaints  GYN: vaginal spotting/discharge occasional Musculoskeletal: left back pain  Extremities: no complaints  Skin: no complaints  Neuro: no complaints  Endocrine: no complaints  Psych: no complaints  Objective:  Physical Examination:  BP 106/71 (BP Location: Left Arm, Patient Position: Sitting)   Pulse 73   Temp (!) 97 F (36.1 C) (Tympanic)   Ht 5' 4 (1.626 m)   Wt 201 lb (91.2 kg)   SpO2 100%   BMI 34.50 kg/m    PERFORMANCE STATUS: 1  GENERAL: Patient is a well appearing female in no acute distress HEENT:  Atraumatic and normocephalic.  NODES:  No cervical, supraclavicular, axillary, or inguinal lymphadenopathy palpated. The node on the left axilla is palpable but less than 1 cm LUNGS: normal respiratory effort ABDOMEN:  Soft, nontender. Nondistended. No masses/ascites/hernia/or hepatomegaly.  EXTREMITIES:  No significant peripheral edema.   NEURO:  Nonfocal. Well oriented.  Appropriate affect.  Pelvic: exam with RN EGBUS: no lesions Cervix: surgically absent Vagina: no lesions, no discharge or bleeding. On palpation foreshortened- ~ 4 cm. Severe agglutination and atrophy. On palpation scarring on the right and then the canal extends higher on the left approximately 2 cm.  Uterus: surgically absent BME: no palpable masses Rectovaginal: deferred  Lab Review No labs on site today  Radiologic Imaging: As per HPI    Assessment:  AMAYRA KIEDROWSKI is a 72 y.o. female diagnosed with grade 1 (p53wt, ER+, pMMR) deeply invasive  endometrioid endometrial cancer in 2009, isolated vaginal recurrence status post radiation therapy and continues letrozole . May 2025 CT noted prominent 7 mm left axillary LN but otherwise no evidence of disease. 08/08/24 PET was independently reviewed and left axillary LN is hypermetabolic. No evidence of recurrent disease otherwise. Tolerating letrozole  well and appears to be having an excellent response with essentially stable disease.   Vaginal atrophy- severe radiation changes, with vaginal stenosis improved with dilator therapy, and atrophy with intermitent spotting. No evidence of vaginal recurrence on exam or imaging.   Osteoporosis- on prolia per endocrinology.   Low back Pain with history of right sacral wing insufficiency fracture- resolved.   Thoracic aortic aneursym- measuring 4.3 cm in diameter. New.   History 3 mm pulmonary nodule- considered benign. Not hypermetabolic on PET or changed in size on recent imaging.   Left lower pole caliectasis with a 2.1 cm staghorn type calculus, asymptomatic  Left lower extremity swelling- negative for dvt May 2025. Followed by vascular.   Medical co-morbidities complicating care: diverticulitis, hypothyroid.  Plan:   Problem List Items Addressed This Visit       Genitourinary   Endometrial cancer (HCC) - Primary   Relevant Orders   CT CHEST ABDOMEN PELVIS W CONTRAST   Other Visit Diagnoses       Encounter for monitoring aromatase inhibitor therapy           Continue letrozole  2.5 mg daily as she is tolerating therapy well. Dr. Luevenia mentioned radiation, however, given the small size of only 8 mm will continue hormonal therapy.   She will continue to have bone density scans per endocrinology.  Recommend repeating in 2026 or 2027.  She will continue Prolia along with calcium and vitamin D as recommended.  Encouraged weightbearing exercise as tolerated.  Sacral insufficiency fracture-follow-up with endocrinology.  3 mm pulmonary  nodule-continues to be stable on imaging.  Suspect benign etiology.  Will monitor on follow-up imaging.  Left lower extremity edema-negative for DVT.  She was followed by vascular for lymphedema.  She can return to them if she would like to consider laser therapy or has other complaints.  Aortic aneurysm-new on PET scan.  Now normal ascending thoracic aortic diameter when  remeasured. We reached out to vascular surgery who recommends that no intervention is recommended until it measures greater than 6 cm.  Will plan to evaluate with imaging annually.  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 Precautions provided.   Disposition: 3 months CT chest/abdomen/pelvis   The patient's diagnosis, an outline of the further diagnostic and laboratory studies which will be required, the recommendation for surgery, and alternatives were discussed with her.  All questions were answered to their satisfaction.   Azzure Garabedian [employer]

## 2024-11-21 NOTE — Telephone Encounter (Signed)
 Called pt to sched CT - pt confirmed date/time/location - pt requested appt reminder via mail - LH

## 2024-11-22 ENCOUNTER — Emergency Department

## 2024-11-22 ENCOUNTER — Inpatient Hospital Stay
Admission: EM | Admit: 2024-11-22 | Discharge: 2024-11-24 | DRG: 322 | Disposition: A | Source: Ambulatory Visit | Attending: Internal Medicine | Admitting: Internal Medicine

## 2024-11-22 ENCOUNTER — Other Ambulatory Visit: Payer: Self-pay

## 2024-11-22 DIAGNOSIS — I200 Unstable angina: Secondary | ICD-10-CM

## 2024-11-22 DIAGNOSIS — C549 Malignant neoplasm of corpus uteri, unspecified: Secondary | ICD-10-CM

## 2024-11-22 DIAGNOSIS — R079 Chest pain, unspecified: Principal | ICD-10-CM

## 2024-11-22 DIAGNOSIS — R9439 Abnormal result of other cardiovascular function study: Secondary | ICD-10-CM

## 2024-11-22 DIAGNOSIS — R0609 Other forms of dyspnea: Secondary | ICD-10-CM

## 2024-11-22 LAB — BASIC METABOLIC PANEL WITH GFR
Anion gap: 15 (ref 5–15)
BUN: 19 mg/dL (ref 8–23)
CO2: 23 mmol/L (ref 22–32)
Calcium: 9.5 mg/dL (ref 8.9–10.3)
Chloride: 102 mmol/L (ref 98–111)
Creatinine, Ser: 0.89 mg/dL (ref 0.44–1.00)
GFR, Estimated: >60 mL/min (ref >60)
Glucose, Bld: 105 mg/dL — ABNORMAL HIGH (ref 70–99)
Potassium: 4.3 mmol/L (ref 3.5–5.1)
Sodium: 140 mmol/L (ref 135–145)

## 2024-11-22 LAB — CBC
HCT: 39.4 % (ref 36.0–46.0)
Hemoglobin: 12.7 g/dL (ref 12.0–15.0)
MCH: 28.2 pg (ref 26.0–34.0)
MCHC: 32.2 g/dL (ref 30.0–36.0)
MCV: 87.4 fL (ref 80.0–100.0)
Platelets: 465 K/uL — ABNORMAL HIGH (ref 150–400)
RBC: 4.51 MIL/uL (ref 3.87–5.11)
RDW: 14.2 % (ref 11.5–15.5)
WBC: 5.7 K/uL (ref 4.0–10.5)
nRBC: 0 % (ref 0.0–0.2)

## 2024-11-22 LAB — D-DIMER, QUANTITATIVE: D-Dimer, Quant: 0.72 ug{FEU}/mL — ABNORMAL HIGH (ref 0.00–0.50)

## 2024-11-22 LAB — PRO BRAIN NATRIURETIC PEPTIDE: Pro Brain Natriuretic Peptide: 144 pg/mL (ref <300.0)

## 2024-11-22 LAB — TROPONIN T, HIGH SENSITIVITY
Troponin T High Sensitivity: <15 ng/L (ref 0–19)
Troponin T High Sensitivity: 26 ng/L — ABNORMAL HIGH (ref 0–19)

## 2024-11-22 MED ORDER — ENOXAPARIN SODIUM 60 MG/0.6ML IJ SOSY
0.5000 mg/kg | PREFILLED_SYRINGE | INTRAMUSCULAR | Status: DC
  Administered 2024-11-22 – 2024-11-23 (×2): 45 mg via SUBCUTANEOUS
  Filled 2024-11-22 (×2): qty 0.6

## 2024-11-22 MED ORDER — LEVOTHYROXINE SODIUM 25 MCG PO TABS
25.0000 ug | ORAL_TABLET | Freq: Every day | ORAL | Status: DC
  Administered 2024-11-23 – 2024-11-24 (×2): 25 ug via ORAL
  Filled 2024-11-22 (×2): qty 1

## 2024-11-22 MED ORDER — LETROZOLE 2.5 MG PO TABS
2.5000 mg | ORAL_TABLET | Freq: Every day | ORAL | Status: DC
  Administered 2024-11-22 – 2024-11-23 (×2): 2.5 mg via ORAL
  Filled 2024-11-22 (×4): qty 1

## 2024-11-22 MED ORDER — LETROZOLE 2.5 MG PO TABS: 2.5000 mg | ORAL_TABLET | Freq: Every day | ORAL | Status: DC

## 2024-11-22 MED ORDER — HYDROCHLOROTHIAZIDE 12.5 MG PO TABS
12.5000 mg | ORAL_TABLET | Freq: Every day | ORAL | Status: DC
  Administered 2024-11-22 – 2024-11-24 (×3): 12.5 mg via ORAL
  Filled 2024-11-22 (×3): qty 1

## 2024-11-22 MED ORDER — EZETIMIBE 10 MG PO TABS
10.0000 mg | ORAL_TABLET | Freq: Every day | ORAL | Status: DC
  Administered 2024-11-22 – 2024-11-24 (×3): 10 mg via ORAL
  Filled 2024-11-22 (×3): qty 1

## 2024-11-22 MED ORDER — PANTOPRAZOLE SODIUM 40 MG PO TBEC
40.0000 mg | DELAYED_RELEASE_TABLET | ORAL | Status: DC
  Administered 2024-11-22 – 2024-11-24 (×2): 40 mg via ORAL
  Filled 2024-11-22 (×3): qty 1

## 2024-11-22 MED ORDER — NITROGLYCERIN 0.4 MG SL SUBL: 0.4000 mg | SUBLINGUAL_TABLET | SUBLINGUAL | Status: DC | PRN

## 2024-11-22 MED ORDER — ASPIRIN 81 MG PO TBEC: 81.0000 mg | DELAYED_RELEASE_TABLET | Freq: Every day | ORAL | Status: DC

## 2024-11-22 MED ORDER — ALBUTEROL SULFATE (2.5 MG/3ML) 0.083% IN NEBU: 3.0000 mL | INHALATION_SOLUTION | Freq: Four times a day (QID) | RESPIRATORY_TRACT | Status: DC | PRN

## 2024-11-22 MED ORDER — LOSARTAN POTASSIUM 50 MG PO TABS
50.0000 mg | ORAL_TABLET | Freq: Every day | ORAL | Status: DC
  Administered 2024-11-22 – 2024-11-24 (×3): 50 mg via ORAL
  Filled 2024-11-22 (×3): qty 1

## 2024-11-22 MED ORDER — ACETAMINOPHEN 325 MG PO TABS: 650.0000 mg | ORAL_TABLET | ORAL | Status: DC | PRN

## 2024-11-22 MED ORDER — ROSUVASTATIN CALCIUM 5 MG PO TABS: 5.0000 mg | ORAL_TABLET | ORAL | Status: DC

## 2024-11-22 MED ORDER — ROSUVASTATIN CALCIUM 5 MG PO TABS
5.0000 mg | ORAL_TABLET | Freq: Every evening | ORAL | Status: DC
  Administered 2024-11-22: 5 mg via ORAL
  Filled 2024-11-22: qty 1

## 2024-11-22 MED ORDER — SERTRALINE HCL 50 MG PO TABS: 50.0000 mg | ORAL_TABLET | Freq: Every day | ORAL | Status: DC

## 2024-11-22 MED ORDER — ONDANSETRON HCL 4 MG/2ML IJ SOLN: 4.0000 mg | Freq: Four times a day (QID) | INTRAMUSCULAR | Status: DC | PRN

## 2024-11-22 MED ORDER — ASPIRIN 81 MG PO TBEC
81.0000 mg | DELAYED_RELEASE_TABLET | Freq: Every day | ORAL | Status: DC
  Administered 2024-11-24: 81 mg via ORAL
  Filled 2024-11-22: qty 1

## 2024-11-22 MED ADMIN — Sertraline HCl Tab 50 MG: 50 mg | ORAL | NDC 60687024211

## 2024-11-22 MED FILL — Sertraline HCl Tab 50 MG: 50.0000 mg | ORAL | Qty: 1 | Status: AC

## 2024-11-22 NOTE — ED Notes (Signed)
 Patient states chest pain and shortness of breath increases with ambulation. Dr. Clarine aware. Patient states she doesn't remember getting nitroglycerin at [facility].

## 2024-11-22 NOTE — ED Notes (Signed)
 Report given to [facility]

## 2024-11-22 NOTE — ED Notes (Signed)
 Patient was changed into a gown and a larger cuff is in place. Patient states her blood pressure is never that high.

## 2024-11-22 NOTE — ED Triage Notes (Addendum)
 Pt arrives from Dr. Celine office where they were participating in a stress test because they experience chest discomfort and SOB when they walk which they experienced while doing the test. Pt states that this has been going on for about 6 weeks. Pt denies any discomfort nor SOB at this time. Pt received 324 ASA and nitroglycerin before arriving to ED. Pt is A&Ox4 during triage.

## 2024-11-22 NOTE — ED Provider Notes (Signed)
 [facility] Provider Note    Event Date/Time   First MD Initiated Contact with Patient 11/22/24 1235     (approximate)   History   Chest Pain and Abnormal ECG  First Nurse Note;  Pt via POV from [facility], pt was getting an EKG done with Dr. Fernande, reports some EKG changes and was told to come over here for further cardiac work up. Dr. Celine office gave 324 ASA and 1 Nitroglycerin.   Pt arrives from Dr. Celine office where they were participating in a stress test because they experience chest discomfort and SOB when they walk which they experienced while doing the test. Pt states that this has been going on for about 6 weeks. Pt denies any discomfort nor SOB at this time. Pt received 324 ASA and nitroglycerin before arriving to ED. Pt is A&Ox4 during triage.    HPI Morgan Dalton is a 73 y.o. female PMH hypertension, hyperlipidemia, prior kidney stones, endometrial cancer, diverticulitis, GERD presents for evaluation of chest pain - Patient states over the past few months she has been having notable dyspnea on exertion and some mild left-sided chest discomfort when walking longer distances -Was getting a stress test in clinic today and was told it was very abnormal.  She was experiencing shortness of breath and left-sided chest pain so they sent her to the ED. -No cough, congestion, shortness of breath at rest - Has been having left leg swelling for several months which is thought to be lymphedema due to prior treatment she had for endometrial cancer - No significant chest pain currently  Per chart review, negative DVT ultrasound and left lower extremity in May of this year.  Physical Exam   Triage Vital Signs: ED Triage Vitals  Encounter Vitals Group     BP 11/22/24 1016 (!) 169/106     Girls Systolic BP Percentile --      Girls Diastolic BP Percentile --      Boys Systolic BP Percentile --      Boys Diastolic BP Percentile --      Pulse Rate 11/22/24 1016  87     Resp 11/22/24 1016 16     Temp 11/22/24 1016 98.6 F (37 C)     Temp Source 11/22/24 1016 Oral     SpO2 11/22/24 1016 100 %     Weight 11/22/24 1018 202 lb (91.6 kg)     Height 11/22/24 1018 5' 4 (1.626 m)     Head Circumference --      Peak Flow --      Pain Score 11/22/24 1017 0     Pain Loc --      Pain Education --      Exclude from Growth Chart --     Most recent vital signs: Vitals:   11/22/24 1430 11/22/24 1555  BP:  (!) 161/80  Pulse: 85 98  Resp: 19 18  Temp:  98 F (36.7 C)  SpO2: 100% 100%     General: Awake, no distress.  CV:  Good peripheral perfusion. RRR, RP 2+ Resp:  Normal effort. CTAB Abd:  No distention. Nontender to deep palpation throughout Other:  Mild-Moderate left leg swelling compared to right   ED Results / Procedures / Treatments   Labs (all labs ordered are listed, but only abnormal results are displayed) Labs Reviewed  BASIC METABOLIC PANEL WITH GFR - Abnormal; Notable for the following components:      Result Value  Glucose, Bld 105 (*)    All other components within normal limits  CBC - Abnormal; Notable for the following components:   Platelets 465 (*)    All other components within normal limits  D-DIMER, QUANTITATIVE - Abnormal; Notable for the following components:   D-Dimer, Quant 0.72 (*)    All other components within normal limits  TROPONIN T, HIGH SENSITIVITY - Abnormal; Notable for the following components:   Troponin T High Sensitivity 26 (*)    All other components within normal limits  PRO BRAIN NATRIURETIC PEPTIDE  LIPOPROTEIN A (LPA)  BASIC METABOLIC PANEL WITH GFR  CBC  TROPONIN T, HIGH SENSITIVITY  TROPONIN T, HIGH SENSITIVITY     EKG  See ED course below.   RADIOLOGY Radiology interpreted by myself and radiology report reviewed.  No acute pathology identified.    PROCEDURES:  Critical Care performed: No  Procedures   MEDICATIONS ORDERED IN ED: Medications  hydrochlorothiazide  (HYDRODIURIL) tablet 12.5 mg (12.5 mg Oral Given 11/22/24 1635)  losartan (COZAAR) tablet 50 mg (50 mg Oral Given 11/22/24 1636)  ezetimibe (ZETIA) tablet 10 mg (10 mg Oral Given 11/22/24 1635)  levothyroxine (SYNTHROID) tablet 25 mcg (has no administration in time range)  pantoprazole (PROTONIX) EC tablet 40 mg (40 mg Oral Given 11/22/24 1636)  albuterol (PROVENTIL) (2.5 MG/3ML) 0.083% nebulizer solution 3 mL (has no administration in time range)  aspirin EC tablet 81 mg (has no administration in time range)  nitroGLYCERIN (NITROSTAT) SL tablet 0.4 mg (has no administration in time range)  acetaminophen  (TYLENOL ) tablet 650 mg (has no administration in time range)  ondansetron  (ZOFRAN ) injection 4 mg (has no administration in time range)  enoxaparin (LOVENOX) injection 45 mg (has no administration in time range)  sertraline (ZOLOFT) tablet 50 mg (50 mg Oral Given 11/22/24 1704)  letrozole  (FEMARA ) tablet 2.5 mg (2.5 mg Oral Given 11/22/24 1813)  rosuvastatin (CRESTOR) tablet 5 mg (has no administration in time range)     IMPRESSION / MDM / ASSESSMENT AND PLAN / ED COURSE  I reviewed the triage vital signs and the nursing notes.                              DDX/MDM/AP: Differential diagnosis includes, but is not limited to, ACS, considered but doubt PE, doubt CHF.  On my review of stress test EKG which patient brings with her, she did develop notable ST depressions in inferior leads and less so in the precordial leads V3-V6.  Possible mild ST elevations versus early repolarization in V1, V2.  These findings and her prior symptoms have resolved on ED EKG.  Plan: - Labs Axson-EKG - Cardiac monitor - Chest x-ray - will discuss with cardiology given abnormal stress test today  Patient's presentation is most consistent with acute presentation with potential threat to life or bodily function.  The patient is on the cardiac monitor to evaluate for evidence of arrhythmia and/or significant  heart rate changes.  ED course below.  Cardiology consulted, admitted to hospitalist service, plan for stress test tomorrow.  Clinical Course as of 11/22/24 2017  Thu Nov 22, 2024  1244 Trop wnl Cbc, bmp wnl [MM]  1244 CXR: IMPRESSION: No active cardiopulmonary disease.   [MM]  1244 Ecg = sinus rhythm, rate 80, no gross ST elevation or depression, no significant repolarization abnormality, normal axis, normal intervals.  No clear evidence of ischemia nor arrhythmia on my interpretation. [MM]  1307  Cardiology paged to discuss [MM]  1346 D-dimer within normal limits when age-adjusted  Troponin with mild rise [MM]  1346 Cardiology paged [MM]  1348 Patient reevaluated, remains overall asymptomatic [MM]  1400 D/w Dr. Ammon, is reviewing stress test ECGs and will get back to me with plan shortly [MM]  1418 Cardiology to return to speak with patient and provide further plan shortly [MM]  1439 Spoke with cardiology PA working with Dr. Ammon after they evaluated patient -Plan for heart catheterization tomorrow -Hold off on heparin at this time, recommend trending troponin, if increasing would consider heparin at that time -Admit to hospitalist [MM]  1442 Hospitalist consult order placed [MM]    Clinical Course User Index [MM] Clarine Ozell LABOR, MD     FINAL CLINICAL IMPRESSION(S) / ED DIAGNOSES   Final diagnoses:  Chest pain, unspecified type  Dyspnea on exertion  Abnormal stress test     Rx / DC Orders   ED Discharge Orders     None        Note:  This document was prepared using Dragon voice recognition software and may include unintentional dictation errors.   Clarine Ozell LABOR, MD 11/22/24 2018

## 2024-11-22 NOTE — Consult Note (Addendum)
 [facility] CLINIC CARDIOLOGY CONSULT NOTE       Patient ID: Morgan Dalton MRN: [id number] DOB/AGE: July 28, 1951 73 y.o.  Admit date: 11/22/2024 Referring Physician Dr. Ozell Dalton Primary Physician Morgan Ophelia [name], MD  Primary Cardiologist None Reason for Consultation Chest pain, abnormal stress test  HPI: Morgan Dalton is a 73 y.o. female  with a past medical history of hypertension, hyperlipidemia, hx endometrial cancer s/p radiation, GERD, hx kidney stones who presented to the ED on 11/22/2024 for chest pain, abnormal stress test. Cardiology was consulted for further evaluation.   Patient underwent stress testing at her PCP office this AM, developed chest pain with exercise as well as EKG changes and thus was sent to the ED for evaluation. Workup in the ED notable for creatinine 0.89, potassium 4.3, hemoglobin 12.7, WBC 5.7. Troponins <15 > 26, BNP 144. EKG in the ED NSR rate 80 bpm, no acute ischemic changes. CXR without acute abnormality.   At the time of my evaluation this afternoon, patient is resting comfortably in ED stretcher with husband at bedside. We discussed her symptoms in further detail. She reports gradually worsening CP and SOB with exertion over the last 6-8 weeks. States that with her usual activity she was beginning to have symptoms and they have become more significant over the last few weeks. Does report that her symptoms resolve with 5 minutes of rest. Denies any palpitations, lightheadedness/dizziness, syncope, orthopnea. Has chronic LE edema L > R (previously diagnosed with lymphedema. She states that overall she is feeling better and denies any CP at the time of my evaluation.   States that she does have a hx of hypertension and hyperlipidemia, did not take her BP medications this AM. Reports a hx of endometrial cancer initially diagnosed and treated 15 years ago with recurrence last year at which time she underwent radiation therapy.   Review of systems complete and  found to be negative unless listed above    Past Medical History:  Diagnosis Date   Ankle fracture, left    Anxiety    Cancer (HCC)    Uterine Cancer   Dyspepsia    Endometrial cancer (HCC)    GERD (gastroesophageal reflux disease)    History of kidney stones    Hx of dysplastic nevus 10/16/2010   Right lower leg. Severe atypia. Excised: 11/25/2010. 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 Well differentiated. Tx: [facility]    Past Surgical History:  Procedure Laterality Date   ABDOMINAL HYSTERECTOMY     CATARACT EXTRACTION W/PHACO Right 12/07/2016   Procedure: CATARACT EXTRACTION PHACO AND INTRAOCULAR LENS PLACEMENT (IOC);  Surgeon: Elsie Carmine, MD;  Location: [facility] ORS;  Service: Ophthalmology;  Laterality: Right;  US  42.5 AP% 23.2 CDE 9.87 Fluid pack lot # [id number]   CATARACT EXTRACTION W/PHACO Left 01/04/2017   Procedure: CATARACT EXTRACTION PHACO AND INTRAOCULAR LENS PLACEMENT (IOC);  Surgeon: Elsie Carmine, MD;  Location: [facility] ORS;  Service: Ophthalmology;  Laterality: Left;  US  39.2 AP% 23.9 CDE 9.38 Fluid Pack Lot # [id number]   CHOLECYSTECTOMY  2014   COLONOSCOPY  2014   COLONOSCOPY WITH ESOPHAGOGASTRODUODENOSCOPY (EGD)  2004   COLONOSCOPY WITH PROPOFOL  N/A 11/07/2018   Procedure: COLONOSCOPY WITH PROPOFOL ;  Surgeon: Gaylyn Gladis PENNER, MD;  Location: [facility] ENDOSCOPY;  Service: Endoscopy;  Laterality: N/A;  COLONOSCOPY WITH PROPOFOL  N/A 12/16/2023   Procedure: COLONOSCOPY WITH PROPOFOL ;  Surgeon: Maryruth Ole DASEN, MD;  Location: [facility] ENDOSCOPY;  Service: Endoscopy;  Laterality: N/A;   DILATION AND CURETTAGE OF UTERUS     EXTRACORPOREAL SHOCK WAVE LITHOTRIPSY     EYE SURGERY     GANGLION CYST EXCISION Right    wrist   HALLUX VALGUS LAPIDUS Left 08/11/2022   Procedure: HALLUX VALGUS LAPIDUS;  Surgeon: Ashley Soulier, DPM;   Location: [facility] ORS;  Service: Podiatry;  Laterality: Left;   KIDNEY STONE SURGERY     several   KNEE ARTHROSCOPY Left 2014   partial medial meniscectomy, medial chondroplasty   NM PET TUMOR IMAGING  02/07/2023   TOTAL ABDOMINAL HYSTERECTOMY W/ BILATERAL SALPINGOOPHORECTOMY  2009    (Not in a hospital admission)  Social History   Socioeconomic History   Marital status: Married    Spouse name: Morgan Dalton   Number of children: 2   Years of education: Not on file   Highest education level: Not on file  Occupational History   Not on file  Tobacco Use   Smoking status: Never   Smokeless tobacco: Never  Vaping Use   Vaping status: Never Used  Substance and Sexual Activity   Alcohol use: No   Drug use: No   Sexual activity: Not on file  Other Topics Concern   Not on file  Social History Narrative   Not on file   Social Drivers of Health   Tobacco Use: Low Risk (11/22/2024)   Patient History    Smoking Tobacco Use: Never    Smokeless Tobacco Use: Never    Passive Exposure: Not on file  Financial Resource Strain: Low Risk  (10/30/2024)   Received from [facility]   Overall Financial Resource Strain (CARDIA)    Difficulty of Paying Living Expenses: Not hard at all  Food Insecurity: No Food Insecurity (10/30/2024)   Received from [facility]   Epic    Within the past 12 months, you worried that your food would run out before you got the money to buy more.: Never true    Within the past 12 months, the food you bought just didn't last and you didn't have money to get more.: Never true  Transportation Needs: No Transportation Needs (10/30/2024)   Received from [facility] - Transportation    In the past 12 months, has lack of transportation kept you from medical appointments or from getting medications?: No    Lack of Transportation (Non-Medical): No  Physical Activity: Not on file  Stress: Not on file  Social  Connections: Not on file  Intimate Partner Violence: Not At Risk (03/18/2023)   Humiliation, Afraid, Rape, and Kick questionnaire    Fear of Current or Ex-Partner: No    Emotionally Abused: No    Physically Abused: No    Sexually Abused: No  Depression (PHQ2-9): Low Risk (11/21/2024)   Depression (PHQ2-9)    PHQ-2 Score: 0  Alcohol Screen: Not on file  Housing: Low Risk  (11/22/2024)   Received from [facility]   Epic    In the last 12 months, was there a time when you were not able to pay the mortgage or rent on time?: No    In the past 12 months, how many times have you moved where you were living?: 0    At any time in the past 12 months, were you homeless  or living in a shelter (including now)?: No  Utilities: Not At Risk (10/30/2024)   Received from [facility]   Epic    In the past 12 months has the electric, gas, oil, or water company threatened to shut off services in your home?: No  Health Literacy: Not on file    Family History  Problem Relation Age of Onset   Breast cancer Paternal Aunt      Vitals:   11/22/24 1300 11/22/24 1330 11/22/24 1400 11/22/24 1415  BP:    (!) 199/86  Pulse:      Resp: 17 20 13 16   Temp:      TempSrc:      SpO2:      Weight:      Height:        PHYSICAL EXAM General: Well appearing elderly female, well nourished, in no acute distress. HEENT: Normocephalic and atraumatic. Neck: No JVD.  Lungs: Normal respiratory effort on room air. Clear bilaterally to auscultation. No wheezes, crackles, rhonchi.  Heart: HRRR. Normal S1 and S2 without gallops or murmurs.  Abdomen: Non-distended appearing.  Msk: Normal strength and tone for age. Extremities: Warm and well perfused. No clubbing, cyanosis. LLE edema, stable per patient.  Neuro: Alert and oriented X 3. Psych: Answers questions appropriately.   Labs: Basic Metabolic Panel: Recent Labs    11/22/24 1022  NA 140  K 4.3  CL 102  CO2 23  GLUCOSE  105*  BUN 19  CREATININE 0.89  CALCIUM 9.5   Liver Function Tests: No results for input(s): AST, ALT, ALKPHOS, BILITOT, PROT, ALBUMIN in the last 72 hours. No results for input(s): LIPASE, AMYLASE in the last 72 hours. CBC: Recent Labs    11/22/24 1022  WBC 5.7  HGB 12.7  HCT 39.4  MCV 87.4  PLT 465*   Cardiac Enzymes: No results for input(s): CKTOTAL, CKMB, CKMBINDEX, TROPONINIHS in the last 72 hours. BNP: No results for input(s): BNP in the last 72 hours. D-Dimer: Recent Labs    11/22/24 1253  DDIMER 0.72*   Hemoglobin A1C: No results for input(s): HGBA1C in the last 72 hours. Fasting Lipid Panel: No results for input(s): CHOL, HDL, LDLCALC, TRIG, CHOLHDL, LDLDIRECT in the last 72 hours. Thyroid Function Tests: No results for input(s): TSH, T4TOTAL, T3FREE, THYROIDAB in the last 72 hours.  Invalid input(s): FREET3 Anemia Panel: No results for input(s): VITAMINB12, FOLATE, FERRITIN, TIBC, IRON, RETICCTPCT in the last 72 hours.   Radiology: DG Chest 2 View Result Date: 11/22/2024 CLINICAL DATA:  Chest pain. EXAM: CHEST - 2 VIEW COMPARISON:  05/09/2015 FINDINGS: Lungs are hyperexpanded. The cardiopericardial silhouette is within normal limits for size. The lungs are clear without focal pneumonia, edema, pneumothorax or pleural effusion. No acute bony abnormality. IMPRESSION: No active cardiopulmonary disease. Electronically Signed   By: [name] M.D.   On: 11/22/2024 11:16   CT CHEST ABDOMEN PELVIS W CONTRAST Addendum Date: 11/21/2024 * ADDENDUM #1 * ADDENDUM: Normal  Template Error:  Post cholecystectomy  without complication ---------------------------------------------------- Electronically signed by: Norleen Boxer MD 11/21/2024 12:03 PM EST RP Workstation: [id number]   Result Date: 11/21/2024 * ORIGINAL REPORT * EXAM: CT CHEST, ABDOMEN AND PELVIS WITH CONTRAST 10/31/2024 10:39:47 AM TECHNIQUE: CT of  the chest, abdomen and pelvis was performed with the administration of intravenous contrast. 100 mL iohexol  (OMNIPAQUE ) 300 MG/ML solution was administered intravenously. Multiplanar reformatted images are provided for review. Automated exposure control, iterative reconstruction, and/or weight based adjustment of the  mA/kV was utilized to reduce the radiation dose to as low as reasonably achievable. COMPARISON: PET scan 08/08/2024, CT exam 04/30/2024. CLINICAL HISTORY: Cervical cancer, monitor; recurrent endometrial cancer with known hypermetabolic left axillary node. * Tracking Code: BO * FINDINGS: CHEST: MEDIASTINUM AND LYMPH NODES: Ascending thoracic aorta measures 37 mm when measured in coronal projection (image 52 series 4). Heart and pericardium are unremarkable. The central airways are clear. No mediastinal lymphadenopathy. Rounded left axillary lymph node measures 8 mm (image 21 of series 2) compared to 8 mm on CT 04/30/2024. No hilar lymphadenopathy. LUNGS AND PLEURA: No focal consolidation or pulmonary edema. No pleural effusion or pneumothorax. No suspicious pulmonary nodules. Nodule in the medial left lobe is unchanged. ABDOMEN AND PELVIS: LIVER: The liver is unremarkable. GALLBLADDER AND BILE DUCTS: Gallbladder is unremarkable. No biliary ductal dilatation. SPLEEN: No acute abnormality. PANCREAS: No acute abnormality. ADRENAL GLANDS: No acute abnormality. KIDNEYS, URETERS AND BLADDER: Staghorn type calcification lower pole of the right kidney. Unchanged from comparison exams. No stones in the left kidney or ureters. No hydronephrosis. No perinephric or periureteral stranding. Urinary bladder is unremarkable. GI AND BOWEL: Stomach demonstrates no acute abnormality. There is no bowel obstruction. REPRODUCTIVE ORGANS: Post hysterectomy. No pelvic sidewall nodularity. PERITONEUM AND RETROPERITONEUM: No ascites. No free air. VASCULATURE: Aorta is normal in caliber. ABDOMINAL AND PELVIS LYMPH NODES: No  lymphadenopathy in the pelvis. No inguinal adenopathy. No abdominal lymphadenopathy. BONES AND SOFT TISSUES: No acute osseous abnormality. No focal soft tissue abnormality. IMPRESSION: 1. No change in size of left axillary lymph node. 2. No evidence of recurrent or progressive cervical carcinoma. 3. Normal ascending thoracic aortic diameter when remeasured. Electronically signed by: Norleen Boxer MD 11/04/2024 04:50 PM EST RP Workstation: [id number]    ECHO ordered   TELEMETRY (personally reviewed): NSR rate 80s  EKG (personally reviewed): NSR rate 80 bpm, no acute ischemic changes  Data reviewed by me 11/22/2024: last 24h vitals tele labs imaging I/O ED provider note, admission H&P  Active Problems:   * No active hospital problems. *    ASSESSMENT AND PLAN:  Morgan Dalton is a 73 y.o. female  with a past medical history of hypertension, hyperlipidemia, hx endometrial cancer s/p radiation, GERD, hx kidney stones who presented to the ED on 11/22/2024 for chest pain, abnormal stress test. Cardiology was consulted for further evaluation.   # Unstable angina # Abnormal stress test # Hypertension # Hyperlipidemia # Hx endometrial cancer s/p radiation Patient was sent to ED from PCP office after having exercise stress test which was abnormal and developing chest pain with this. EKGs from stress test reviewed, noted ST depression in inferior leads. EKG repeated in the ED and shows resolution of earlier changes - NSR without acute ischemic changes.Troponins <15 > 26. Patient with resolution of chest pain.  -Defer initiation of heparin at this time. If troponins continue to rise >100 then would start.  -Echo ordered.  -Resume home losartan 50 mg daily and hydrochlorothiazide 12.5 mg daily.  -S/p asa x2 after stress test. Will order aspirin 81 mg daily to start tomorrow. Resume home zetia 10 mg daily. She has a history of intolerance to multiple statins. -Discussed the risks and benefits of  proceeding with LHC for further evaluation with the patient. She is agreeable to proceed.  NPO at midnight until [facility] tomorrow AM (11/23/24) with Dr. Ammon.  Written consent will be obtained.  Further recommendations following LHC.     TIMI Risk Score for Unstable Angina or Non-ST  Elevation MI:   The patient's TIMI risk score is 3, which indicates a 13% risk of all cause mortality, new or recurrent myocardial infarction or need for urgent revascularization in the next 14 days.   This patient's plan of care was discussed and created with Dr. Ammon and he is in agreement.  Signed: Danita Bloch, PA-C  11/22/2024, 3:02 PM [facility]

## 2024-11-22 NOTE — H&P (Signed)
 History and Physical    Morgan Dalton FMW:[id number] DOB: [birth year] DOA: 11/22/2024  PCP: Fernande Ophelia [name], MD (Confirm with patient/family/NH records and if not entered, this has to be entered at [facility] point of entry) Patient coming from: Home  I have personally briefly reviewed patient's old medical records in [facility]  Chief Complaint: Chest pain  HPI: Morgan Dalton is a 73 y.o. female with medical history significant of endometrial cancer status post hysterectomy on hormone therapy, chronic left leg lymphedema, HTN, HLD, hypothyroidism, sent from PCPs office for evaluation of unstable angina.  Patient started to have intermittent chest pain 1 week ago, reproducible, associated with activity, usually subsided on its own.  PCP arranged office stress test today.  During the treadmill exercise, patient started to feel pressure-like chest pain similar to the episode she experienced since last week, radiated to left shoulder associated with palpitations and shortness of breath.  EKG at the same time showed dynamic ST depressions on inferior leads and lateral leads and patient sent to ED for further evaluation.  Chest pain subsided after patient arrival in the ED. ED Course: Afebrile, nontachycardic blood pressure 190/80 O2 saturation 100% on room air.  Repeat EKG showed resolution of ST changes on inferior and lateral leads.  Troponin 15> 26.  Review of Systems: As per HPI otherwise 14 point review of systems negative.    Past Medical History:  Diagnosis Date   Ankle fracture, left    Anxiety    Cancer (HCC)    Uterine Cancer   Dyspepsia    Endometrial cancer (HCC)    GERD (gastroesophageal reflux disease)    History of kidney stones    Hx of dysplastic nevus 10/16/2010   Right lower leg. Severe atypia. Excised: 11/25/2010. Margins free   Hypercholesterolemia    Hyperlipidemia    Hypertension    Hypothyroidism    Kidney stone    Osteopenia    Squamous cell carcinoma of skin  08/30/2019   Right temple. Well differentiated. Tx: [facility]    Past Surgical History:  Procedure Laterality Date   ABDOMINAL HYSTERECTOMY     CATARACT EXTRACTION W/PHACO Right 12/07/2016   Procedure: CATARACT EXTRACTION PHACO AND INTRAOCULAR LENS PLACEMENT (IOC);  Surgeon: Elsie Carmine, MD;  Location: [facility] ORS;  Service: Ophthalmology;  Laterality: Right;  US  42.5 AP% 23.2 CDE 9.87 Fluid pack lot # [id number]   CATARACT EXTRACTION W/PHACO Left 01/04/2017   Procedure: CATARACT EXTRACTION PHACO AND INTRAOCULAR LENS PLACEMENT (IOC);  Surgeon: Elsie Carmine, MD;  Location: [facility] ORS;  Service: Ophthalmology;  Laterality: Left;  US  39.2 AP% 23.9 CDE 9.38 Fluid Pack Lot # [id number]   CHOLECYSTECTOMY  2014   COLONOSCOPY  2014   COLONOSCOPY WITH ESOPHAGOGASTRODUODENOSCOPY (EGD)  2004   COLONOSCOPY WITH PROPOFOL  N/A 11/07/2018   Procedure: COLONOSCOPY WITH PROPOFOL ;  Surgeon: Gaylyn Gladis PENNER, MD;  Location: [facility] ENDOSCOPY;  Service: Endoscopy;  Laterality: N/A;   COLONOSCOPY WITH PROPOFOL  N/A 12/16/2023   Procedure: COLONOSCOPY WITH PROPOFOL ;  Surgeon: Maryruth Ole DASEN, MD;  Location: [facility] ENDOSCOPY;  Service: Endoscopy;  Laterality: N/A;   DILATION AND CURETTAGE OF UTERUS     EXTRACORPOREAL SHOCK WAVE LITHOTRIPSY     EYE SURGERY     GANGLION CYST EXCISION Right    wrist   HALLUX VALGUS LAPIDUS Left 08/11/2022   Procedure: HALLUX VALGUS LAPIDUS;  Surgeon: Ashley Soulier, DPM;  Location: [facility] ORS;  Service: Podiatry;  Laterality: Left;   KIDNEY STONE SURGERY  several   KNEE ARTHROSCOPY Left 2014   partial medial meniscectomy, medial chondroplasty   NM PET TUMOR IMAGING  02/07/2023   TOTAL ABDOMINAL HYSTERECTOMY W/ BILATERAL SALPINGOOPHORECTOMY  2009     reports that she has never smoked. She has never used smokeless tobacco. She reports that she does not drink alcohol and does not use drugs.  Allergies[1]  Family History  Problem Relation Age of Onset   Breast cancer Paternal  Aunt      Prior to Admission medications  Medication Sig Start Date End Date Taking? Authorizing Provider  albuterol (VENTOLIN HFA) 108 (90 Base) MCG/ACT inhaler Inhale 2 puffs into the lungs every 6 (six) hours as needed for wheezing. 11/02/24  Yes [provider]  ascorbic acid (VITAMIN C) 500 MG tablet Take 500 mg by mouth daily. Patient states she takes it October - April.    [provider]  aspirin EC 81 MG tablet Take 81 mg by mouth daily.  08/07/12   [provider]  calcium carbonate (OS-CAL - DOSED IN MG OF ELEMENTAL CALCIUM) 1250 (500 Ca) MG tablet Take by mouth.    [provider]  cholecalciferol (VITAMIN D3) 25 MCG (1000 UNIT) tablet Take 1,000 Units by mouth daily.    [provider]  Coenzyme Q10 (CO Q-10) 100 MG CAPS Take 100 mg by mouth daily.    [provider]  Cranberry 500 MG TABS Take by mouth.    [provider]  ezetimibe (ZETIA) 10 MG tablet Take 10 mg by mouth daily.    [provider]  Glucosamine-Chondroitin 750-600 MG TABS Take 1 tablet by mouth daily.    [provider]  hydrochlorothiazide (HYDRODIURIL) 25 MG tablet Take 12.5 mg by mouth daily.    [provider]  Anselm Oil 1000 MG CAPS Take by mouth.    [provider]  letrozole  (FEMARA ) 2.5 MG tablet Take 1 tablet (2.5 mg total) by mouth daily. 12/09/23   Dasie Tinnie MATSU, NP  levothyroxine (SYNTHROID, LEVOTHROID) 25 MCG tablet Take 25 mcg by mouth daily before breakfast.    [provider]  losartan (COZAAR) 50 MG tablet Take 1 tablet by mouth daily. 12/31/22 11/21/24  [provider]  Multiple Vitamins-Minerals (WOMENS 50+ MULTI VITAMIN PO) Take by mouth.    [provider]  pantoprazole (PROTONIX) 40 MG tablet Take 40 mg by mouth every other day.    [provider]  rosuvastatin (CRESTOR) 5 MG tablet Take 5 mg by mouth once a week. Thursday night    [provider]   sertraline (ZOLOFT) 50 MG tablet Take 50 mg by mouth daily.    [provider]    Physical Exam: Vitals:   11/22/24 1300 11/22/24 1330 11/22/24 1400 11/22/24 1415  BP:    (!) 199/86  Pulse:      Resp: 17 20 13 16   Temp:      TempSrc:      SpO2:      Weight:      Height:        Constitutional: NAD, calm, comfortable Vitals:   11/22/24 1300 11/22/24 1330 11/22/24 1400 11/22/24 1415  BP:    (!) 199/86  Pulse:      Resp: 17 20 13 16   Temp:      TempSrc:      SpO2:      Weight:      Height:       Eyes: PERRL, lids and conjunctivae  normal ENMT: Mucous membranes are moist. Posterior pharynx clear of any exudate or lesions.Normal dentition.  Neck: normal, supple, no masses, no thyromegaly Respiratory: clear to auscultation bilaterally, no wheezing, no crackles. Normal respiratory effort. No accessory muscle use.  Cardiovascular: Regular rate and rhythm, no murmurs / rubs / gallops.  Chronic left leg lymphedema. 2+ pedal pulses. No carotid bruits.  Abdomen: no tenderness, no masses palpated. No hepatosplenomegaly. Bowel sounds positive.  Musculoskeletal: no clubbing / cyanosis. No joint deformity upper and lower extremities. Good ROM, no contractures. Normal muscle tone.  Skin: no rashes, lesions, ulcers. No induration Neurologic: CN 2-12 grossly intact. Sensation intact, DTR normal. Strength 5/5 in all 4.  Psychiatric: Normal judgment and insight. Alert and oriented x 3. Normal mood.     Labs on Admission: I have personally reviewed following labs and imaging studies  CBC: Recent Labs  Lab 11/22/24 1022  WBC 5.7  HGB 12.7  HCT 39.4  MCV 87.4  PLT 465*   Basic Metabolic Panel: Recent Labs  Lab 11/22/24 1022  NA 140  K 4.3  CL 102  CO2 23  GLUCOSE 105*  BUN 19  CREATININE 0.89  CALCIUM 9.5   GFR: Estimated Creatinine Clearance: 59.9 mL/min (by C-G formula based on SCr of 0.89 mg/dL). Liver Function Tests: No results for input(s): AST, ALT,  ALKPHOS, BILITOT, PROT, ALBUMIN in the last 168 hours. No results for input(s): LIPASE, AMYLASE in the last 168 hours. No results for input(s): AMMONIA in the last 168 hours. Coagulation Profile: No results for input(s): INR, PROTIME in the last 168 hours. Cardiac Enzymes: No results for input(s): CKTOTAL, CKMB, CKMBINDEX, TROPONINI in the last 168 hours. BNP (last 3 results) Recent Labs    11/22/24 1253  PROBNP 144.0   HbA1C: No results for input(s): HGBA1C in the last 72 hours. CBG: No results for input(s): GLUCAP in the last 168 hours. Lipid Profile: No results for input(s): CHOL, HDL, LDLCALC, TRIG, CHOLHDL, LDLDIRECT in the last 72 hours. Thyroid Function Tests: No results for input(s): TSH, T4TOTAL, FREET4, T3FREE, THYROIDAB in the last 72 hours. Anemia Panel: No results for input(s): VITAMINB12, FOLATE, FERRITIN, TIBC, IRON, RETICCTPCT in the last 72 hours. Urine analysis:    Component Value Date/Time   COLORURINE YELLOW (A) 04/21/2023 1100   APPEARANCEUR Slightly cloudy 07/04/2024 0931   LABSPEC 1.014 04/21/2023 1100   PHURINE 5.0 04/21/2023 1100   GLUCOSEU Negative 07/04/2024 0931   HGBUR NEGATIVE 04/21/2023 1100   BILIRUBINUR Negative 07/04/2024 0931   KETONESUR NEGATIVE 04/21/2023 1100   PROTEINUR Trace 07/04/2024 0931   PROTEINUR 30 (A) 04/21/2023 1100   NITRITE Negative 07/04/2024 0931   NITRITE NEGATIVE 04/21/2023 1100   LEUKOCYTESUR 2+ (A) 07/04/2024 0931   LEUKOCYTESUR LARGE (A) 04/21/2023 1100    Radiological Exams on Admission: DG Chest 2 View Result Date: 11/22/2024 CLINICAL DATA:  Chest pain. EXAM: CHEST - 2 VIEW COMPARISON:  05/09/2015 FINDINGS: Lungs are hyperexpanded. The cardiopericardial silhouette is within normal limits for size. The lungs are clear without focal pneumonia, edema, pneumothorax or pleural effusion. No acute bony abnormality. IMPRESSION: No active cardiopulmonary  disease. Electronically Signed   By: [name] M.D.   On: 11/22/2024 11:16    EKG: Independently reviewed.  Sinus rhythm, no acute ST changes.  Assessment/Plan Principal Problem:   Unstable angina (HCC)  (please populate well all problems here in Problem List. (For example, if patient is on BP meds at home and you resume or decide to hold them,  it is a problem that needs to be her. Same for CAD, COPD, HLD and so on)  Unstable angina - Continue aspirin statin beta-blocker - [facility] cardiology recommended no heparin drip as patient chest pain subsided.  Trend troponins and consider heparin drip initiation if chest pain resumes or troponin continue to trend up. Echocardiogram - Cardiology arranged [facility] tomorrow morning. - Risk factor modifications, check A1c and lipid panel  HTN, uncontrolled - Likely secondary to angina - Resume home BP meds including HCTZ, ARB and metoprolol  Hypothyroidism - Continue Synthroid.  DVT prophylaxis: Lovenox Code Status: Full code Family Communication: Husband at bedside Disposition Plan: Expect less than 2 midnight hospital stay Consults called: [facility] cardiology Admission status: PCU observation   Cort ONEIDA Mana MD [facility] Pager [phone]  11/22/2024, 3:08 PM       [1]  Allergies Allergen Reactions   Statins Other (See Comments)    Myalgia

## 2024-11-22 NOTE — ED Notes (Signed)
Lab called to stick pt.

## 2024-11-22 NOTE — ED Triage Notes (Signed)
 First Nurse Note;  Pt via POV from [facility], pt was getting an EKG done with Dr. Fernande, reports some EKG changes and was told to come over here for further cardiac work up. Dr. Celine office gave 324 ASA and 1 Nitroglycerin.

## 2024-11-23 ENCOUNTER — Encounter: Admission: EM | Disposition: A | Payer: Self-pay | Source: Ambulatory Visit | Attending: Internal Medicine

## 2024-11-23 ENCOUNTER — Other Ambulatory Visit: Payer: Self-pay

## 2024-11-23 ENCOUNTER — Observation Stay: Admit: 2024-11-23 | Discharge: 2024-11-23 | Disposition: A | Attending: Student

## 2024-11-23 DIAGNOSIS — E039 Hypothyroidism, unspecified: Secondary | ICD-10-CM | POA: Diagnosis present

## 2024-11-23 DIAGNOSIS — I890 Lymphedema, not elsewhere classified: Secondary | ICD-10-CM | POA: Diagnosis present

## 2024-11-23 DIAGNOSIS — Z803 Family history of malignant neoplasm of breast: Secondary | ICD-10-CM | POA: Diagnosis not present

## 2024-11-23 DIAGNOSIS — E7800 Pure hypercholesterolemia, unspecified: Secondary | ICD-10-CM | POA: Diagnosis present

## 2024-11-23 DIAGNOSIS — F32A Depression, unspecified: Secondary | ICD-10-CM | POA: Diagnosis present

## 2024-11-23 DIAGNOSIS — Z90722 Acquired absence of ovaries, bilateral: Secondary | ICD-10-CM | POA: Diagnosis not present

## 2024-11-23 DIAGNOSIS — I25110 Atherosclerotic heart disease of native coronary artery with unstable angina pectoris: Secondary | ICD-10-CM | POA: Diagnosis present

## 2024-11-23 DIAGNOSIS — Z923 Personal history of irradiation: Secondary | ICD-10-CM | POA: Diagnosis not present

## 2024-11-23 DIAGNOSIS — Z79890 Hormone replacement therapy (postmenopausal): Secondary | ICD-10-CM | POA: Diagnosis not present

## 2024-11-23 DIAGNOSIS — I10 Essential (primary) hypertension: Secondary | ICD-10-CM | POA: Diagnosis present

## 2024-11-23 DIAGNOSIS — I200 Unstable angina: Secondary | ICD-10-CM | POA: Diagnosis not present

## 2024-11-23 DIAGNOSIS — Z79811 Long term (current) use of aromatase inhibitors: Secondary | ICD-10-CM | POA: Diagnosis not present

## 2024-11-23 DIAGNOSIS — R079 Chest pain, unspecified: Secondary | ICD-10-CM | POA: Diagnosis present

## 2024-11-23 DIAGNOSIS — Z79899 Other long term (current) drug therapy: Secondary | ICD-10-CM | POA: Diagnosis not present

## 2024-11-23 DIAGNOSIS — Z9049 Acquired absence of other specified parts of digestive tract: Secondary | ICD-10-CM | POA: Diagnosis not present

## 2024-11-23 DIAGNOSIS — K219 Gastro-esophageal reflux disease without esophagitis: Secondary | ICD-10-CM | POA: Diagnosis present

## 2024-11-23 DIAGNOSIS — Z85828 Personal history of other malignant neoplasm of skin: Secondary | ICD-10-CM | POA: Diagnosis not present

## 2024-11-23 DIAGNOSIS — Z8542 Personal history of malignant neoplasm of other parts of uterus: Secondary | ICD-10-CM | POA: Diagnosis not present

## 2024-11-23 DIAGNOSIS — Z955 Presence of coronary angioplasty implant and graft: Secondary | ICD-10-CM | POA: Diagnosis not present

## 2024-11-23 DIAGNOSIS — R7303 Prediabetes: Secondary | ICD-10-CM | POA: Diagnosis present

## 2024-11-23 DIAGNOSIS — Z87442 Personal history of urinary calculi: Secondary | ICD-10-CM | POA: Diagnosis not present

## 2024-11-23 DIAGNOSIS — M8580 Other specified disorders of bone density and structure, unspecified site: Secondary | ICD-10-CM | POA: Diagnosis present

## 2024-11-23 DIAGNOSIS — Z90710 Acquired absence of both cervix and uterus: Secondary | ICD-10-CM | POA: Diagnosis not present

## 2024-11-23 DIAGNOSIS — Z7982 Long term (current) use of aspirin: Secondary | ICD-10-CM | POA: Diagnosis not present

## 2024-11-23 LAB — ECHOCARDIOGRAM COMPLETE
AR max vel: 2.54 cm2
AV Area VTI: 2.59 cm2
AV Area mean vel: 2.43 cm2
AV Mean grad: 3 mmHg
AV Peak grad: 6.4 mmHg
Ao pk vel: 1.26 m/s
Area-P 1/2: 2.5 cm2
Height: 64 in
MV VTI: 2.5 cm2
S' Lateral: 2.6 cm
Weight: 3232 [oz_av]

## 2024-11-23 LAB — CBC
HCT: 40 % (ref 36.0–46.0)
Hemoglobin: 12.8 g/dL (ref 12.0–15.0)
MCH: 28.3 pg (ref 26.0–34.0)
MCHC: 32 g/dL (ref 30.0–36.0)
MCV: 88.5 fL (ref 80.0–100.0)
Platelets: 470 K/uL — ABNORMAL HIGH (ref 150–400)
RBC: 4.52 MIL/uL (ref 3.87–5.11)
RDW: 14.2 % (ref 11.5–15.5)
WBC: 6.5 K/uL (ref 4.0–10.5)
nRBC: 0 % (ref 0.0–0.2)

## 2024-11-23 LAB — POCT ACTIVATED CLOTTING TIME
Activated Clotting Time: 296 s
Activated Clotting Time: 337 s

## 2024-11-23 LAB — BASIC METABOLIC PANEL WITH GFR
Anion gap: 11 (ref 5–15)
BUN: 17 mg/dL (ref 8–23)
CO2: 25 mmol/L (ref 22–32)
Calcium: 8.9 mg/dL (ref 8.9–10.3)
Chloride: 105 mmol/L (ref 98–111)
Creatinine, Ser: 0.89 mg/dL (ref 0.44–1.00)
GFR, Estimated: >60 mL/min (ref >60)
Glucose, Bld: 112 mg/dL — ABNORMAL HIGH (ref 70–99)
Potassium: 4 mmol/L (ref 3.5–5.1)
Sodium: 140 mmol/L (ref 135–145)

## 2024-11-23 LAB — TROPONIN T, HIGH SENSITIVITY: Troponin T High Sensitivity: 17 ng/L (ref 0–19)

## 2024-11-23 SURGERY — LEFT HEART CATH AND CORONARY ANGIOGRAPHY
Anesthesia: Moderate Sedation

## 2024-11-23 MED ORDER — LIDOCAINE HCL 1 % IJ SOLN
INTRAMUSCULAR | Status: AC
  Filled 2024-11-23: qty 20

## 2024-11-23 MED ORDER — SODIUM CHLORIDE 0.9% FLUSH: 3.0000 mL | INTRAVENOUS | Status: DC | PRN

## 2024-11-23 MED ORDER — LABETALOL HCL 5 MG/ML IV SOLN: 10.0000 mg | INTRAVENOUS | Status: AC | PRN

## 2024-11-23 MED ORDER — ROSUVASTATIN CALCIUM 10 MG PO TABS
20.0000 mg | ORAL_TABLET | Freq: Every evening | ORAL | Status: DC
  Administered 2024-11-23: 20 mg via ORAL
  Filled 2024-11-23: qty 2

## 2024-11-23 MED ORDER — ASPIRIN 81 MG PO CHEW
CHEWABLE_TABLET | ORAL | Status: AC
  Filled 2024-11-23: qty 1

## 2024-11-23 MED ORDER — IOHEXOL 300 MG/ML  SOLN
INTRAMUSCULAR | Status: DC | PRN
  Administered 2024-11-23: 190 mL

## 2024-11-23 MED ORDER — MIDAZOLAM HCL 2 MG/2ML IJ SOLN
INTRAMUSCULAR | Status: AC
  Filled 2024-11-23: qty 2

## 2024-11-23 MED ORDER — LABETALOL HCL 5 MG/ML IV SOLN
INTRAVENOUS | Status: AC
  Filled 2024-11-23: qty 4

## 2024-11-23 MED ORDER — HEPARIN SODIUM (PORCINE) 1000 UNIT/ML IJ SOLN
INTRAMUSCULAR | Status: AC
  Filled 2024-11-23: qty 10

## 2024-11-23 MED ORDER — SODIUM CHLORIDE 0.9% FLUSH
3.0000 mL | Freq: Two times a day (BID) | INTRAVENOUS | Status: DC
  Administered 2024-11-23 (×2): 3 mL via INTRAVENOUS

## 2024-11-23 MED ORDER — ASPIRIN 81 MG PO CHEW: 81.0000 mg | CHEWABLE_TABLET | Freq: Every day | ORAL | Status: DC

## 2024-11-23 MED ORDER — PRASUGREL HCL 10 MG PO TABS
ORAL_TABLET | ORAL | Status: AC
  Filled 2024-11-23: qty 6

## 2024-11-23 MED ORDER — FREE WATER: 500.0000 mL | Freq: Once | Status: DC

## 2024-11-23 MED ORDER — HYDRALAZINE HCL 20 MG/ML IJ SOLN: 10.0000 mg | INTRAMUSCULAR | Status: AC | PRN

## 2024-11-23 MED ORDER — FREE WATER
500.0000 mL | Freq: Once | Status: AC
  Administered 2024-11-23: 500 mL via ORAL

## 2024-11-23 MED ORDER — SODIUM CHLORIDE 0.9 % IV SOLN: 250.0000 mL | INTRAVENOUS | Status: AC | PRN

## 2024-11-23 MED ORDER — PRASUGREL HCL 10 MG PO TABS
10.0000 mg | ORAL_TABLET | Freq: Every day | ORAL | Status: DC
  Administered 2024-11-24: 10 mg via ORAL
  Filled 2024-11-23: qty 1

## 2024-11-23 MED ORDER — FENTANYL CITRATE (PF) 100 MCG/2ML IJ SOLN
INTRAMUSCULAR | Status: DC | PRN
  Administered 2024-11-23: 25 ug via INTRAVENOUS

## 2024-11-23 MED ORDER — FENTANYL CITRATE (PF) 100 MCG/2ML IJ SOLN
INTRAMUSCULAR | Status: AC
  Filled 2024-11-23: qty 2

## 2024-11-23 MED ORDER — HEPARIN (PORCINE) IN NACL 1000-0.9 UT/500ML-% IV SOLN
INTRAVENOUS | Status: AC
  Filled 2024-11-23: qty 1000

## 2024-11-23 MED ORDER — ASPIRIN 81 MG PO CHEW
81.0000 mg | CHEWABLE_TABLET | ORAL | Status: AC
  Administered 2024-11-23: 81 mg via ORAL

## 2024-11-23 MED ORDER — VERAPAMIL HCL 2.5 MG/ML IV SOLN
INTRAVENOUS | Status: AC
  Filled 2024-11-23: qty 2

## 2024-11-23 MED ADMIN — Sertraline HCl Tab 50 MG: 50 mg | ORAL | NDC 60687024211

## 2024-11-23 MED ADMIN — Heparin Sodium (Porcine) Inj 1000 Unit/ML: 4000 [IU] | INTRAVENOUS | NDC 71288040210

## 2024-11-23 MED ADMIN — Heparin Sod (Porcine)-NaCl IV Soln 1000 Unit/500ML-0.9%: 1000 mL | NDC 00409762013

## 2024-11-23 MED ADMIN — Prasugrel HCl Tab 10 MG (Base Equiv): 60 mg | ORAL | NDC 65862083030

## 2024-11-23 MED ADMIN — Heparin Sodium (Porcine) Inj 1000 Unit/ML: 6000 [IU] | INTRAVENOUS | NDC 71288040210

## 2024-11-23 MED ADMIN — Midazolam HCl Inj PF 2 MG/2ML (Base Equivalent): 1 mg | INTRAVENOUS | NDC 00409000125

## 2024-11-23 MED ADMIN — Verapamil HCl IV Soln 2.5 MG/ML: 2.5 mg | INTRA_ARTERIAL | NDC 43066003101

## 2024-11-23 MED ADMIN — Lidocaine HCl Local Preservative Free (PF) Inj 1%: 2 mL | NDC 00409427902

## 2024-11-23 SURGICAL SUPPLY — 16 items
BALLOON TREK RX 2.5X12 (BALLOONS) IMPLANT
BALLOON ~~LOC~~ TREK NEO RX 3.5X12 (BALLOONS) IMPLANT
CATH 5FR JL3.5 JR4 ANG PIG MP (CATHETERS) IMPLANT
CATH VISTA GUIDE 6FR XB3.5 EPK (CATHETERS) IMPLANT
DEVICE RAD TR BAND REGULAR (VASCULAR PRODUCTS) IMPLANT
DRAPE BRACHIAL (DRAPES) IMPLANT
GLIDESHEATH SLEND SS 6F .021 (SHEATH) IMPLANT
GUIDEWIRE INQWIRE 1.5J.035X260 (WIRE) IMPLANT
KIT ENCORE 26 ADVANTAGE (KITS) IMPLANT
KIT SYRINGE INJ CVI SPIKEX1 (MISCELLANEOUS) IMPLANT
PACK CARDIAC CATH (CUSTOM PROCEDURE TRAY) ×1 IMPLANT
SET ATX-X65L (MISCELLANEOUS) IMPLANT
STATION PROTECTION PRESSURIZED (MISCELLANEOUS) IMPLANT
STENT ONYX FRONTIER 3.0X15 (Permanent Stent) IMPLANT
TUBING CIL FLEX 10 FLL-RA (TUBING) IMPLANT
WIRE ASAHI PROWATER 180CM (WIRE) IMPLANT

## 2024-11-23 NOTE — Plan of Care (Signed)
   Problem: Education: Goal: Understanding of cardiac disease, CV risk reduction, and recovery process will improve Outcome: Progressing Goal: Individualized Educational Video(s) Outcome: Progressing   Problem: Activity: Goal: Ability to tolerate increased activity will improve Outcome: Progressing   Problem: Cardiac: Goal: Ability to achieve and maintain adequate cardiovascular perfusion will improve Outcome: Progressing   Problem: Health Behavior/Discharge Planning: Goal: Ability to safely manage health-related needs after discharge will improve Outcome: Progressing   Problem: Education: Goal: Understanding of CV disease, CV risk reduction, and recovery process will improve Outcome: Progressing Goal: Individualized Educational Video(s) Outcome: Progressing   Problem: Activity: Goal: Ability to return to baseline activity level will improve Outcome: Progressing   Problem: Cardiovascular: Goal: Ability to achieve and maintain adequate cardiovascular perfusion will improve Outcome: Progressing Goal: Vascular access site(s) Level 0-1 will be maintained Outcome: Progressing   Problem: Health Behavior/Discharge Planning: Goal: Ability to safely manage health-related needs after discharge will improve Outcome: Progressing   Problem: Education: Goal: Knowledge of General Education information will improve Description: Including pain rating scale, medication(s)/side effects and non-pharmacologic comfort measures Outcome: Progressing   Problem: Health Behavior/Discharge Planning: Goal: Ability to manage health-related needs will improve Outcome: Progressing   Problem: Clinical Measurements: Goal: Ability to maintain clinical measurements within normal limits will improve Outcome: Progressing Goal: Will remain free from infection Outcome: Progressing Goal: Diagnostic test results will improve Outcome: Progressing Goal: Respiratory complications will improve Outcome:  Progressing Goal: Cardiovascular complication will be avoided Outcome: Progressing   Problem: Activity: Goal: Risk for activity intolerance will decrease Outcome: Progressing   Problem: Nutrition: Goal: Adequate nutrition will be maintained Outcome: Progressing   Problem: Coping: Goal: Level of anxiety will decrease Outcome: Progressing   Problem: Elimination: Goal: Will not experience complications related to bowel motility Outcome: Progressing Goal: Will not experience complications related to urinary retention Outcome: Progressing   Problem: Pain Managment: Goal: General experience of comfort will improve and/or be controlled Outcome: Progressing   Problem: Safety: Goal: Ability to remain free from injury will improve Outcome: Progressing   Problem: Skin Integrity: Goal: Risk for impaired skin integrity will decrease Outcome: Progressing

## 2024-11-23 NOTE — Care Management (Signed)
°  Pt is in the CATH Lab unable to speak with pt at this time.

## 2024-11-23 NOTE — Progress Notes (Signed)
 Progress Note    Morgan Dalton  FMW:[id number] DOB: Jan 17, 1951  DOA: 11/22/2024 PCP: Fernande Ophelia [name], MD      Brief Narrative:    Medical records reviewed and are as summarized below:  FARRIS BLASH is a 73 y.o. female with medical history significant for endometrial cancer status post hysterectomy on hormone therapy, chronic left leg lymphedema, HTN, HLD, hypothyroidism, who was referred from her PCPs office because of chest pain/unstable angina and abnormal stress EKG test.  She has been experiencing chest pain for about a week.  She had an exercise treadmill test which reproduced chest pain similar to what she had been experiencing.  Chest pain radiated to the left shoulder and associated with palpitations and shortness of breath.  Reportedly, EKG during the treadmill test showed ST depressions.  By the time she got to the ED, chest pain had subsided.  Troponin 15, 26  EKG showed normal sinus rhythm Chest x-ray did not show any acute abnormality.      Assessment/Plan:   Principal Problem:   Unstable angina (HCC)   Body mass index is 34.67 kg/m.  Chest pain/unstable angina: S/p left heart cath with DES to LAD.  LAD was 95% occluded. She has been started on aspirin and Effient. 2D echo showed EF estimated at 60 to 65%, indeterminate LV diastolic parameters.   Hypertension: BP is better.  Continue losartan.   Prediabetes: Hemoglobin A1c 6.0.  Carb modified diet recommended.   Dyslipidemia: LDL 116, HDL 65, total cholesterol 762, triglycerides 285. Continue rosuvastatin.  Increase dose to 20 mg.   Hypothyroidism: Continue Synthroid   Patient said she was informed by Dr. Ammon, cardiologist, that she will monitored in the hospital overnight.  Plan to discharge home tomorrow.   Diet Order             Diet Heart Room service appropriate? Yes; Fluid consistency: Thin  Diet effective now                                   Consultants: Cardiologist  Procedures: Left heart cath with DES to LAD on 11/23/2024    Medications:    [START ON 11/24/2024] aspirin  81 mg Oral Daily   [MAR Hold] aspirin EC  81 mg Oral Daily   [MAR Hold] enoxaparin (LOVENOX) injection  0.5 mg/kg Subcutaneous Q24H   [MAR Hold] ezetimibe  10 mg Oral Daily   free water  500 mL Oral Once   [MAR Hold] hydrochlorothiazide  12.5 mg Oral Daily   labetalol       [MAR Hold] letrozole   2.5 mg Oral Daily   [MAR Hold] levothyroxine  25 mcg Oral QAC breakfast   [MAR Hold] losartan  50 mg Oral Daily   [MAR Hold] pantoprazole  40 mg Oral QODAY   [START ON 11/24/2024] prasugrel  10 mg Oral Daily   [MAR Hold] rosuvastatin  5 mg Oral QPM   [MAR Hold] sertraline  50 mg Oral Daily   sodium chloride  flush  3 mL Intravenous Q12H   Continuous Infusions:  sodium chloride        Anti-infectives (From admission, onward)    None              Family Communication/Anticipated D/C date and plan/Code Status   DVT prophylaxis:      Code Status: Full Code  Family Communication: Plan discussed with husband at  the bedside Disposition Plan: Plan to discharge home tomorrow   Status is: Inpatient Remains inpatient appropriate because: Unstable angina s/p DES to LAD       Subjective:   Interval events noted.  She says she felt a little dizzy because she had been laying in bed for too long.  She feels better after sitting up for a while.  No chest pain or shortness of breath.  Husband at the bedside.  Objective:    Vitals:   11/23/24 1445 11/23/24 1500 11/23/24 1530 11/23/24 1600  BP: 135/79 (!) 164/70 (!) 150/75 128/69  Pulse: 74 70 76 81  Resp: (!) 23 18 18 16   Temp:      TempSrc:      SpO2: 98% 97% 96% 97%  Weight:      Height:       No data found.   Intake/Output Summary (Last 24 hours) at 11/23/2024 1619 Last data filed at 11/23/2024 0834 Gross per 24 hour  Intake 500 ml   Output --  Net 500 ml   Filed Weights   11/22/24 1018  Weight: 91.6 kg    Exam:  GEN: NAD SKIN: No rash EYES: Warm and dry ENT: MMM CV: RRR PULM: CTA B ABD: soft, ND, NT, +BS CNS: AAO x 3, non focal EXT: Chronic left leg lymphedema.  Right radial puncture site without bleeding.        Data Reviewed:   I have personally reviewed following labs and imaging studies:  Labs: Labs show the following:   Basic Metabolic Panel: Recent Labs  Lab 11/22/24 1022 11/23/24 0500  NA 140 140  K 4.3 4.0  CL 102 105  CO2 23 25  GLUCOSE 105* 112*  BUN 19 17  CREATININE 0.89 0.89  CALCIUM 9.5 8.9   GFR Estimated Creatinine Clearance: 59.9 mL/min (by C-G formula based on SCr of 0.89 mg/dL). Liver Function Tests: No results for input(s): AST, ALT, ALKPHOS, BILITOT, PROT, ALBUMIN in the last 168 hours. No results for input(s): LIPASE, AMYLASE in the last 168 hours. No results for input(s): AMMONIA in the last 168 hours. Coagulation profile No results for input(s): INR, PROTIME in the last 168 hours.  CBC: Recent Labs  Lab 11/22/24 1022 11/23/24 0500  WBC 5.7 6.5  HGB 12.7 12.8  HCT 39.4 40.0  MCV 87.4 88.5  PLT 465* 470*   Cardiac Enzymes: No results for input(s): CKTOTAL, CKMB, CKMBINDEX, TROPONINI in the last 168 hours. BNP (last 3 results) Recent Labs    11/22/24 1253  PROBNP 144.0   CBG: No results for input(s): GLUCAP in the last 168 hours. D-Dimer: Recent Labs    11/22/24 1253  DDIMER 0.72*   Hgb A1c: No results for input(s): HGBA1C in the last 72 hours. Lipid Profile: No results for input(s): CHOL, HDL, LDLCALC, TRIG, CHOLHDL, LDLDIRECT in the last 72 hours. Thyroid function studies: No results for input(s): TSH, T4TOTAL, T3FREE, THYROIDAB in the last 72 hours.  Invalid input(s): FREET3 Anemia work up: No results for input(s): VITAMINB12, FOLATE, FERRITIN, TIBC, IRON,  RETICCTPCT in the last 72 hours. Sepsis Labs: Recent Labs  Lab 11/22/24 1022 11/23/24 0500  WBC 5.7 6.5    Microbiology No results found for this or any previous visit (from the past 240 hours).  Procedures and diagnostic studies:  ECHOCARDIOGRAM COMPLETE Result Date: 11/23/2024    ECHOCARDIOGRAM REPORT   Patient Name:   ELFRIDA PIXLEY Date of Exam: 11/23/2024 Medical Rec #:  [id number]  Height:       64.0 in Accession #:    [id number]    Weight:       202.0 lb Date of Birth:  02/17/1951      BSA:          1.965 m Patient Age:    73 years      BP:           142/68 mmHg Patient Gender: F             HR:           74 bpm. Exam Location:  [facility] Procedure: 2D Echo, Color Doppler, Cardiac Doppler, Strain Analysis and 3D Echo            (Both Spectral and Color Flow Doppler were utilized during            procedure). Indications:     Chest pain R07.9  History:         Patient has no prior history of Echocardiogram examinations.                  Risk Factors:Hypertension and Dyslipidemia. Anxiety.  Sonographer:     Christopher Furnace Referring Phys:  [id number] CARALYN HUDSON Diagnosing Phys: Marsa Dooms MD  Sonographer Comments: Global longitudinal strain was attempted. IMPRESSIONS  1. Left ventricular ejection fraction, by estimation, is 60 to 65%. The left ventricle has normal function. The left ventricle has no regional wall motion abnormalities. Left ventricular diastolic parameters are indeterminate. The global longitudinal strain is abnormal.  2. Right ventricular systolic function is normal. The right ventricular size is normal.  3. The mitral valve is normal in structure. Trivial mitral valve regurgitation. No evidence of mitral stenosis.  4. The aortic valve is normal in structure. Aortic valve regurgitation is not visualized. No aortic stenosis is present.  5. The inferior vena cava is normal in size with greater than 50% respiratory variability, suggesting right atrial pressure of 3 mmHg. FINDINGS   Left Ventricle: Left ventricular ejection fraction, by estimation, is 60 to 65%. The left ventricle has normal function. The left ventricle has no regional wall motion abnormalities. Strain was performed and the global longitudinal strain is abnormal. The left ventricular internal cavity size was normal in size. There is no left ventricular hypertrophy. Left ventricular diastolic parameters are indeterminate. Right Ventricle: The right ventricular size is normal. No increase in right ventricular wall thickness. Right ventricular systolic function is normal. Left Atrium: Left atrial size was normal in size. Right Atrium: Right atrial size was normal in size. Pericardium: There is no evidence of pericardial effusion. Mitral Valve: The mitral valve is normal in structure. Trivial mitral valve regurgitation. No evidence of mitral valve stenosis. MV peak gradient, 5.4 mmHg. The mean mitral valve gradient is 2.0 mmHg. Tricuspid Valve: The tricuspid valve is normal in structure. Tricuspid valve regurgitation is trivial. No evidence of tricuspid stenosis. Aortic Valve: The aortic valve is normal in structure. Aortic valve regurgitation is not visualized. No aortic stenosis is present. Aortic valve mean gradient measures 3.0 mmHg. Aortic valve peak gradient measures 6.4 mmHg. Aortic valve area, by VTI measures 2.59 cm. Pulmonic Valve: The pulmonic valve was normal in structure. Pulmonic valve regurgitation is not visualized. No evidence of pulmonic stenosis. Aorta: The aortic root is normal in size and structure. Venous: The inferior vena cava is normal in size with greater than 50% respiratory variability, suggesting right atrial pressure of 3 mmHg. IAS/Shunts: No atrial level shunt detected  by color flow Doppler. Additional Comments: 3D was performed not requiring image post processing on an independent workstation and was indeterminate.  LEFT VENTRICLE PLAX 2D LVIDd:         3.80 cm   Diastology LVIDs:         2.60 cm    LV e' medial:    4.35 cm/s LV PW:         1.00 cm   LV E/e' medial:  15.3 LV IVS:        1.10 cm   LV e' lateral:   6.53 cm/s LVOT diam:     2.00 cm   LV E/e' lateral: 10.2 LV SV:         65 LV SV Index:   33 LVOT Area:     3.14 cm  RIGHT VENTRICLE RV Basal diam:  2.80 cm RV Mid diam:    2.30 cm LEFT ATRIUM             Index        RIGHT ATRIUM           Index LA diam:        2.30 cm 1.17 cm/m   RA Area:     11.60 cm LA Vol (A2C):   26.1 ml 13.28 ml/m  RA Volume:   22.60 ml  11.50 ml/m LA Vol (A4C):   21.9 ml 11.15 ml/m LA Biplane Vol: 24.9 ml 12.67 ml/m  AORTIC VALVE AV Area (Vmax):    2.54 cm AV Area (Vmean):   2.43 cm AV Area (VTI):     2.59 cm AV Vmax:           126.00 cm/s AV Vmean:          81.400 cm/s AV VTI:            0.251 m AV Peak Grad:      6.4 mmHg AV Mean Grad:      3.0 mmHg LVOT Vmax:         102.00 cm/s LVOT Vmean:        63.000 cm/s LVOT VTI:          0.207 m LVOT/AV VTI ratio: 0.82  AORTA Ao Root diam: 3.40 cm MITRAL VALVE MV Area (PHT): 2.50 cm    SHUNTS MV Area VTI:   2.50 cm    Systemic VTI:  0.21 m MV Peak grad:  5.4 mmHg    Systemic Diam: 2.00 cm MV Mean grad:  2.0 mmHg MV Vmax:       1.16 m/s MV Vmean:      61.0 cm/s MV Decel Time: 304 msec MV E velocity: 66.40 cm/s MV A velocity: 98.10 cm/s MV E/A ratio:  0.68 Marsa Dooms MD Electronically signed by Marsa Dooms MD Signature Date/Time: 11/23/2024/1:16:13 PM    Final    CARDIAC CATHETERIZATION Result Date: 11/23/2024   Prox LAD lesion is 95% stenosed.   Mid LAD lesion is 20% stenosed.   A drug-eluting stent was successfully placed using a STENT ONYX FRONTIER 3.0X15.   Post intervention, there is a 0% residual stenosis.   The left ventricular systolic function is normal.   LV end diastolic pressure is normal.   The left ventricular ejection fraction is 55-65% by visual estimate. 1.  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  Normal left ventricular function 3.  Successful PCI with 3.0  x 15 mm Onyx frontier DES proximal LAD 4.  Hyperlipidemia with  statin intolerance Recommendations 1.  Dual antiplatelet therapy uninterrupted x 1 year 2.  Will arrange for PCSK9 inhibitor therapy as outpatient   DG Chest 2 View Result Date: 11/22/2024 CLINICAL DATA:  Chest pain. EXAM: CHEST - 2 VIEW COMPARISON:  05/09/2015 FINDINGS: Lungs are hyperexpanded. The cardiopericardial silhouette is within normal limits for size. The lungs are clear without focal pneumonia, edema, pneumothorax or pleural effusion. No acute bony abnormality. IMPRESSION: No active cardiopulmonary disease. Electronically Signed   By: [name] M.D.   On: 11/22/2024 11:16               LOS: 0 days   Spike Desilets  [facility]   Pager on [email or web address]. If 7PM-7AM, please contact night-coverage at [email or web address]     11/23/2024, 4:19 PM

## 2024-11-23 NOTE — Progress Notes (Signed)
 Bedside handoff given to [facility].  Patient stable.

## 2024-11-23 NOTE — Progress Notes (Signed)
 [facility] Cardiology  SUBJECTIVE: Patient laying on stretcher following cardiac catheterization and PCI, denies chest pain or shortness of breath   Vitals:   11/23/24 1245 11/23/24 1300 11/23/24 1302 11/23/24 1330  BP: (!) 142/68 (!) 165/68 (!) 149/70 (!) 165/81  Pulse: 74 70 71 73  Resp: 20 20 16 16   Temp:      TempSrc:      SpO2: 97% 97% 97% 98%  Weight:      Height:         Intake/Output Summary (Last 24 hours) at 11/23/2024 1357 Last data filed at 11/23/2024 9165 Gross per 24 hour  Intake 500 ml  Output --  Net 500 ml      PHYSICAL EXAM  General: Well developed, well nourished, in no acute distress HEENT:  Normocephalic and atramatic Neck:  No JVD.  Lungs: Clear bilaterally to auscultation and percussion. Heart: HRRR . Normal S1 and S2 without gallops or murmurs.  Abdomen: Bowel sounds are positive, abdomen soft and non-tender  Msk:  Back normal, normal gait. Normal strength and tone for age. Extremities: No clubbing, cyanosis or edema.   Neuro: Alert and oriented X 3. Psych:  Good affect, responds appropriately   LABS: Basic Metabolic Panel: Recent Labs    11/22/24 1022 11/23/24 0500  NA 140 140  K 4.3 4.0  CL 102 105  CO2 23 25  GLUCOSE 105* 112*  BUN 19 17  CREATININE 0.89 0.89  CALCIUM 9.5 8.9   Liver Function Tests: No results for input(s): AST, ALT, ALKPHOS, BILITOT, PROT, ALBUMIN in the last 72 hours. No results for input(s): LIPASE, AMYLASE in the last 72 hours. CBC: Recent Labs    11/22/24 1022 11/23/24 0500  WBC 5.7 6.5  HGB 12.7 12.8  HCT 39.4 40.0  MCV 87.4 88.5  PLT 465* 470*   Cardiac Enzymes: No results for input(s): CKTOTAL, CKMB, CKMBINDEX, TROPONINI in the last 72 hours. BNP: Invalid input(s): POCBNP D-Dimer: Recent Labs    11/22/24 1253  DDIMER 0.72*   Hemoglobin A1C: No results for input(s): HGBA1C in the last 72 hours. Fasting Lipid Panel: No results for input(s): CHOL, HDL,  LDLCALC, TRIG, CHOLHDL, LDLDIRECT in the last 72 hours. Thyroid Function Tests: No results for input(s): TSH, T4TOTAL, T3FREE, THYROIDAB in the last 72 hours.  Invalid input(s): FREET3 Anemia Panel: No results for input(s): VITAMINB12, FOLATE, FERRITIN, TIBC, IRON, RETICCTPCT in the last 72 hours.  ECHOCARDIOGRAM COMPLETE Result Date: 11/23/2024    ECHOCARDIOGRAM REPORT   Patient Name:   Morgan Dalton Date of Exam: 11/23/2024 Medical Rec #:  [id number]     Height:       64.0 in Accession #:    [id number]    Weight:       202.0 lb Date of Birth:  03-03-1951      BSA:          1.965 m Patient Age:    73 years      BP:           142/68 mmHg Patient Gender: F             HR:           74 bpm. Exam Location:  [facility] Procedure: 2D Echo, Color Doppler, Cardiac Doppler, Strain Analysis and 3D Echo            (Both Spectral and Color Flow Doppler were utilized during            procedure). Indications:  Chest pain R07.9  History:         Patient has no prior history of Echocardiogram examinations.                  Risk Factors:Hypertension and Dyslipidemia. Anxiety.  Sonographer:     Christopher Furnace Referring Phys:  [id number] CARALYN HUDSON Diagnosing Phys: Marsa Dooms MD  Sonographer Comments: Global longitudinal strain was attempted. IMPRESSIONS  1. Left ventricular ejection fraction, by estimation, is 60 to 65%. The left ventricle has normal function. The left ventricle has no regional wall motion abnormalities. Left ventricular diastolic parameters are indeterminate. The global longitudinal strain is abnormal.  2. Right ventricular systolic function is normal. The right ventricular size is normal.  3. The mitral valve is normal in structure. Trivial mitral valve regurgitation. No evidence of mitral stenosis.  4. The aortic valve is normal in structure. Aortic valve regurgitation is not visualized. No aortic stenosis is present.  5. The inferior vena cava is normal in size with  greater than 50% respiratory variability, suggesting right atrial pressure of 3 mmHg. FINDINGS  Left Ventricle: Left ventricular ejection fraction, by estimation, is 60 to 65%. The left ventricle has normal function. The left ventricle has no regional wall motion abnormalities. Strain was performed and the global longitudinal strain is abnormal. The left ventricular internal cavity size was normal in size. There is no left ventricular hypertrophy. Left ventricular diastolic parameters are indeterminate. Right Ventricle: The right ventricular size is normal. No increase in right ventricular wall thickness. Right ventricular systolic function is normal. Left Atrium: Left atrial size was normal in size. Right Atrium: Right atrial size was normal in size. Pericardium: There is no evidence of pericardial effusion. Mitral Valve: The mitral valve is normal in structure. Trivial mitral valve regurgitation. No evidence of mitral valve stenosis. MV peak gradient, 5.4 mmHg. The mean mitral valve gradient is 2.0 mmHg. Tricuspid Valve: The tricuspid valve is normal in structure. Tricuspid valve regurgitation is trivial. No evidence of tricuspid stenosis. Aortic Valve: The aortic valve is normal in structure. Aortic valve regurgitation is not visualized. No aortic stenosis is present. Aortic valve mean gradient measures 3.0 mmHg. Aortic valve peak gradient measures 6.4 mmHg. Aortic valve area, by VTI measures 2.59 cm. Pulmonic Valve: The pulmonic valve was normal in structure. Pulmonic valve regurgitation is not visualized. No evidence of pulmonic stenosis. Aorta: The aortic root is normal in size and structure. Venous: The inferior vena cava is normal in size with greater than 50% respiratory variability, suggesting right atrial pressure of 3 mmHg. IAS/Shunts: No atrial level shunt detected by color flow Doppler. Additional Comments: 3D was performed not requiring image post processing on an independent workstation and was  indeterminate.  LEFT VENTRICLE PLAX 2D LVIDd:         3.80 cm   Diastology LVIDs:         2.60 cm   LV e' medial:    4.35 cm/s LV PW:         1.00 cm   LV E/e' medial:  15.3 LV IVS:        1.10 cm   LV e' lateral:   6.53 cm/s LVOT diam:     2.00 cm   LV E/e' lateral: 10.2 LV SV:         65 LV SV Index:   33 LVOT Area:     3.14 cm  RIGHT VENTRICLE RV Basal diam:  2.80 cm RV Mid diam:  2.30 cm LEFT ATRIUM             Index        RIGHT ATRIUM           Index LA diam:        2.30 cm 1.17 cm/m   RA Area:     11.60 cm LA Vol (A2C):   26.1 ml 13.28 ml/m  RA Volume:   22.60 ml  11.50 ml/m LA Vol (A4C):   21.9 ml 11.15 ml/m LA Biplane Vol: 24.9 ml 12.67 ml/m  AORTIC VALVE AV Area (Vmax):    2.54 cm AV Area (Vmean):   2.43 cm AV Area (VTI):     2.59 cm AV Vmax:           126.00 cm/s AV Vmean:          81.400 cm/s AV VTI:            0.251 m AV Peak Grad:      6.4 mmHg AV Mean Grad:      3.0 mmHg LVOT Vmax:         102.00 cm/s LVOT Vmean:        63.000 cm/s LVOT VTI:          0.207 m LVOT/AV VTI ratio: 0.82  AORTA Ao Root diam: 3.40 cm MITRAL VALVE MV Area (PHT): 2.50 cm    SHUNTS MV Area VTI:   2.50 cm    Systemic VTI:  0.21 m MV Peak grad:  5.4 mmHg    Systemic Diam: 2.00 cm MV Mean grad:  2.0 mmHg MV Vmax:       1.16 m/s MV Vmean:      61.0 cm/s MV Decel Time: 304 msec MV E velocity: 66.40 cm/s MV A velocity: 98.10 cm/s MV E/A ratio:  0.68 Marsa Dooms MD Electronically signed by Marsa Dooms MD Signature Date/Time: 11/23/2024/1:16:13 PM    Final    CARDIAC CATHETERIZATION Result Date: 11/23/2024   Prox LAD lesion is 95% stenosed.   Mid LAD lesion is 20% stenosed.   A drug-eluting stent was successfully placed using a STENT ONYX FRONTIER 3.0X15.   Post intervention, there is a 0% residual stenosis.   The left ventricular systolic function is normal.   LV end diastolic pressure is normal.   The left ventricular ejection fraction is 55-65% by visual estimate. 1.  One-vessel coronary artery  disease with 95% stenosis proximal LAD 2.  Normal left ventricular function 3.  Successful PCI with 3.0 x 15 mm Onyx frontier DES proximal LAD 4.  Hyperlipidemia with statin intolerance Recommendations 1.  Dual antiplatelet therapy uninterrupted x 1 year 2.  Will arrange for PCSK9 inhibitor therapy as outpatient   DG Chest 2 View Result Date: 11/22/2024 CLINICAL DATA:  Chest pain. EXAM: CHEST - 2 VIEW COMPARISON:  05/09/2015 FINDINGS: Lungs are hyperexpanded. The cardiopericardial silhouette is within normal limits for size. The lungs are clear without focal pneumonia, edema, pneumothorax or pleural effusion. No acute bony abnormality. IMPRESSION: No active cardiopulmonary disease. Electronically Signed   By: [name] M.D.   On: 11/22/2024 11:16     Echo EF 60-65%  TELEMETRY: Sinus rhythm:  ASSESSMENT AND PLAN:  Principal Problem:   Unstable angina (HCC)    1.  CAD with unstable angina, cardiac catheterization revealing 95% stenosis proximal LAD, underwent successful PCI with DES proximal LAD 2.  Hyperlipidemia, on low-dose rosuvastatin, with history of statin intolerance, will seek authorization for PCSK9 inhibitor  as outpatient 3.  Essential hypertension, on losartan  Commendations  1.  Dual antiplatelet therapy uninterrupted x 1 year (aspirin and prasugrel) 2.  Start PCSK9 inhibitor therapy as outpatient 3.  If patient does well clinically, may consider discharge later today   Marsa Dooms, MD, PhD, FACC 11/23/2024 1:57 PM

## 2024-11-23 NOTE — Progress Notes (Signed)
*  PRELIMINARY RESULTS* Echocardiogram 2D Echocardiogram has been performed.  Morgan Dalton 11/23/2024, 12:46 PM

## 2024-11-24 ENCOUNTER — Other Ambulatory Visit: Payer: Self-pay

## 2024-11-24 DIAGNOSIS — Z955 Presence of coronary angioplasty implant and graft: Secondary | ICD-10-CM

## 2024-11-24 DIAGNOSIS — I200 Unstable angina: Secondary | ICD-10-CM

## 2024-11-24 LAB — BASIC METABOLIC PANEL WITH GFR
Anion gap: 15 (ref 5–15)
BUN: 21 mg/dL (ref 8–23)
CO2: 23 mmol/L (ref 22–32)
Calcium: 8.8 mg/dL — ABNORMAL LOW (ref 8.9–10.3)
Chloride: 102 mmol/L (ref 98–111)
Creatinine, Ser: 0.95 mg/dL (ref 0.44–1.00)
GFR, Estimated: >60 mL/min (ref >60)
Glucose, Bld: 102 mg/dL — ABNORMAL HIGH (ref 70–99)
Potassium: 3.5 mmol/L (ref 3.5–5.1)
Sodium: 139 mmol/L (ref 135–145)

## 2024-11-24 LAB — CBC
HCT: 38.4 % (ref 36.0–46.0)
Hemoglobin: 12.5 g/dL (ref 12.0–15.0)
MCH: 28.3 pg (ref 26.0–34.0)
MCHC: 32.6 g/dL (ref 30.0–36.0)
MCV: 86.9 fL (ref 80.0–100.0)
Platelets: 465 K/uL — ABNORMAL HIGH (ref 150–400)
RBC: 4.42 MIL/uL (ref 3.87–5.11)
RDW: 14.4 % (ref 11.5–15.5)
WBC: 7.1 K/uL (ref 4.0–10.5)
nRBC: 0 % (ref 0.0–0.2)

## 2024-11-24 MED ORDER — PRASUGREL HCL 10 MG PO TABS
10.0000 mg | ORAL_TABLET | Freq: Every day | ORAL | 0 refills | Status: AC
  Filled 2024-11-24: qty 30, 30d supply, fill #0

## 2024-11-24 MED ORDER — ROSUVASTATIN CALCIUM 20 MG PO TABS
20.0000 mg | ORAL_TABLET | Freq: Every evening | ORAL | 0 refills | Status: AC
  Filled 2024-11-24: qty 30, 30d supply, fill #0

## 2024-11-24 MED ADMIN — Sertraline HCl Tab 50 MG: 50 mg | ORAL | NDC 60687024211

## 2024-11-24 NOTE — Discharge Summary (Signed)
 Physician Discharge Summary   Patient: Morgan Dalton MRN: [id number] DOB: September 04, 1951  Admit date:     11/22/2024  Discharge date: 11/24/2024  Discharge Physician: AIDA CHO   PCP: Fernande Ophelia [name], MD   Recommendations at discharge:   Follow-up with Dr. Ammon, cardiologist, in 1 week Follow-up with PCP in 1 to 2 weeks Please obtain medication refills including Effient , from PCP or cardiologist.  Discharge Diagnoses: Principal Problem:   Unstable angina (HCC)  Resolved Problems:   * No resolved hospital problems. *  Hospital Course:   Morgan Dalton is a 73 y.o. female with medical history significant for endometrial cancer status post hysterectomy on hormone therapy, chronic left leg lymphedema, HTN, HLD, hypothyroidism, who was referred from her PCPs office because of chest pain/unstable angina and abnormal stress EKG test.  She has been experiencing chest pain for about a week.  She had an exercise treadmill test which reproduced chest pain similar to what she had been experiencing.  Chest pain radiated to the left shoulder and associated with palpitations and shortness of breath.  Reportedly, EKG during the treadmill test showed ST depressions.  By the time she got to the ED, chest pain had subsided.   Troponin 15, 26   EKG showed normal sinus rhythm Chest x-ray did not show any acute abnormality.   Assessment and Plan:   Chest pain/unstable angina: S/p left heart cath with DES to LAD.  LAD was 95% occluded. She has been started on aspirin  and Effient . 2D echo showed EF estimated at 60 to 65%, indeterminate LV diastolic parameters.     Hypertension: BP is better.  Continue losartan .     Prediabetes: Hemoglobin A1c 6.0.  Carb modified diet recommended.     Dyslipidemia: LDL 116, HDL 65, total cholesterol 762, triglycerides 285. Continue rosuvastatin .  Increase dose to 20 mg. Patient said she had myalgia with other statins in the past.  She was taking  rosuvastatin  5 mg every week.  She is willing to try rosuvastatin  20 mg daily.  She has been advised to go back to previous dose if she has any complications from the higher dose.  She will also follow-up with cardiologist for consideration for PCSK9 inhibitors.     Hypothyroidism: Continue Synthroid     Her condition has improved and she is deemed stable for discharge to home today.  Discharge plans discussed with the patient and her husband at the bedside.      Consultants: Cardiologist Procedures performed: Left heart cath with DES to LAD on 11/23/2024   Disposition: Home Diet recommendation:  Cardiac and Carb modified diet DISCHARGE MEDICATION: Allergies as of 11/24/2024       Reactions   Statins Other (See Comments)   Myalgia         Medication List     TAKE these medications    albuterol  108 (90 Base) MCG/ACT inhaler Commonly known as: VENTOLIN  HFA Inhale 2 puffs into the lungs every 6 (six) hours as needed for wheezing.   ascorbic acid 500 MG tablet Commonly known as: VITAMIN C Take 500 mg by mouth daily. Patient states she takes it October - April.   aspirin  EC 81 MG tablet Take 81 mg by mouth daily.   calcium  carbonate 1250 (500 Ca) MG tablet Commonly known as: OS-CAL - dosed in mg of elemental calcium  Take by mouth.   cholecalciferol 25 MCG (1000 UNIT) tablet Commonly known as: VITAMIN D3 Take 1,000 Units by mouth daily.  Co Q-10 100 MG Caps Take 100 mg by mouth daily.   Cranberry 500 MG Tabs Take by mouth.   ezetimibe  10 MG tablet Commonly known as: ZETIA  Take 10 mg by mouth daily.   Glucosamine-Chondroitin 750-600 MG Tabs Take 1 tablet by mouth daily.   hydrochlorothiazide  25 MG tablet Commonly known as: HYDRODIURIL  Take 12.5 mg by mouth daily.   Krill Oil 1000 MG Caps Take by mouth.   letrozole  2.5 MG tablet Commonly known as: FEMARA  Take 1 tablet (2.5 mg total) by mouth daily.   levothyroxine  25 MCG tablet Commonly known as:  SYNTHROID  Take 25 mcg by mouth daily before breakfast.   losartan  50 MG tablet Commonly known as: COZAAR  Take 1 tablet by mouth daily.   pantoprazole  40 MG tablet Commonly known as: PROTONIX  Take 40 mg by mouth every other day.   prasugrel  10 MG Tabs tablet Commonly known as: EFFIENT  Take 1 tablet (10 mg total) by mouth daily.   rosuvastatin  20 MG tablet Commonly known as: CRESTOR  Take 1 tablet (20 mg total) by mouth every evening. What changed:  medication strength how much to take when to take this additional instructions   sertraline  50 MG tablet Commonly known as: ZOLOFT  Take 50 mg by mouth daily.   WOMENS 50+ MULTI VITAMIN PO Take by mouth.        Follow-up Information     Paraschos, Alexander, MD. Go in 1 week(s).   Specialty: Cardiology Contact information: [address] [facility] [place] [address] [phone]                Discharge Exam: Fredricka Weights   11/22/24 1018  Weight: 91.6 kg   GEN: NAD SKIN: Warm and dry EYES: No pallor or icterus ENT: MMM CV: RRR PULM: CTA B ABD: soft, ND, NT, +BS CNS: AAO x 3, non focal EXT: Chronic left leg lymphedema   Condition at discharge: good  The results of significant diagnostics from this hospitalization (including imaging, microbiology, ancillary and laboratory) are listed below for reference.   Imaging Studies: ECHOCARDIOGRAM COMPLETE Result Date: 11/23/2024    ECHOCARDIOGRAM REPORT   Patient Name:   Morgan Dalton Date of Exam: 11/23/2024 Medical Rec #:  [id number]     Height:       64.0 in Accession #:    [id number]    Weight:       202.0 lb Date of Birth:  1951/03/15      BSA:          1.965 m Patient Age:    73 years      BP:           142/68 mmHg Patient Gender: F             HR:           74 bpm. Exam Location:  [facility] Procedure: 2D Echo, Color Doppler, Cardiac Doppler, Strain Analysis and 3D Echo            (Both Spectral and Color Flow Doppler were utilized  during            procedure). Indications:     Chest pain R07.9  History:         Patient has no prior history of Echocardiogram examinations.                  Risk Factors:Hypertension and Dyslipidemia. Anxiety.  Sonographer:     Christopher Furnace Referring Phys:  [id number]  CARALYN HUDSON Diagnosing Phys: Marsa Dooms MD  Sonographer Comments: Global longitudinal strain was attempted. IMPRESSIONS  1. Left ventricular ejection fraction, by estimation, is 60 to 65%. The left ventricle has normal function. The left ventricle has no regional wall motion abnormalities. Left ventricular diastolic parameters are indeterminate. The global longitudinal strain is abnormal.  2. Right ventricular systolic function is normal. The right ventricular size is normal.  3. The mitral valve is normal in structure. Trivial mitral valve regurgitation. No evidence of mitral stenosis.  4. The aortic valve is normal in structure. Aortic valve regurgitation is not visualized. No aortic stenosis is present.  5. The inferior vena cava is normal in size with greater than 50% respiratory variability, suggesting right atrial pressure of 3 mmHg. FINDINGS  Left Ventricle: Left ventricular ejection fraction, by estimation, is 60 to 65%. The left ventricle has normal function. The left ventricle has no regional wall motion abnormalities. Strain was performed and the global longitudinal strain is abnormal. The left ventricular internal cavity size was normal in size. There is no left ventricular hypertrophy. Left ventricular diastolic parameters are indeterminate. Right Ventricle: The right ventricular size is normal. No increase in right ventricular wall thickness. Right ventricular systolic function is normal. Left Atrium: Left atrial size was normal in size. Right Atrium: Right atrial size was normal in size. Pericardium: There is no evidence of pericardial effusion. Mitral Valve: The mitral valve is normal in structure. Trivial mitral valve  regurgitation. No evidence of mitral valve stenosis. MV peak gradient, 5.4 mmHg. The mean mitral valve gradient is 2.0 mmHg. Tricuspid Valve: The tricuspid valve is normal in structure. Tricuspid valve regurgitation is trivial. No evidence of tricuspid stenosis. Aortic Valve: The aortic valve is normal in structure. Aortic valve regurgitation is not visualized. No aortic stenosis is present. Aortic valve mean gradient measures 3.0 mmHg. Aortic valve peak gradient measures 6.4 mmHg. Aortic valve area, by VTI measures 2.59 cm. Pulmonic Valve: The pulmonic valve was normal in structure. Pulmonic valve regurgitation is not visualized. No evidence of pulmonic stenosis. Aorta: The aortic root is normal in size and structure. Venous: The inferior vena cava is normal in size with greater than 50% respiratory variability, suggesting right atrial pressure of 3 mmHg. IAS/Shunts: No atrial level shunt detected by color flow Doppler. Additional Comments: 3D was performed not requiring image post processing on an independent workstation and was indeterminate.  LEFT VENTRICLE PLAX 2D LVIDd:         3.80 cm   Diastology LVIDs:         2.60 cm   LV e' medial:    4.35 cm/s LV PW:         1.00 cm   LV E/e' medial:  15.3 LV IVS:        1.10 cm   LV e' lateral:   6.53 cm/s LVOT diam:     2.00 cm   LV E/e' lateral: 10.2 LV SV:         65 LV SV Index:   33 LVOT Area:     3.14 cm  RIGHT VENTRICLE RV Basal diam:  2.80 cm RV Mid diam:    2.30 cm LEFT ATRIUM             Index        RIGHT ATRIUM           Index LA diam:        2.30 cm 1.17 cm/m   RA Area:  11.60 cm LA Vol (A2C):   26.1 ml 13.28 ml/m  RA Volume:   22.60 ml  11.50 ml/m LA Vol (A4C):   21.9 ml 11.15 ml/m LA Biplane Vol: 24.9 ml 12.67 ml/m  AORTIC VALVE AV Area (Vmax):    2.54 cm AV Area (Vmean):   2.43 cm AV Area (VTI):     2.59 cm AV Vmax:           126.00 cm/s AV Vmean:          81.400 cm/s AV VTI:            0.251 m AV Peak Grad:      6.4 mmHg AV Mean Grad:       3.0 mmHg LVOT Vmax:         102.00 cm/s LVOT Vmean:        63.000 cm/s LVOT VTI:          0.207 m LVOT/AV VTI ratio: 0.82  AORTA Ao Root diam: 3.40 cm MITRAL VALVE MV Area (PHT): 2.50 cm    SHUNTS MV Area VTI:   2.50 cm    Systemic VTI:  0.21 m MV Peak grad:  5.4 mmHg    Systemic Diam: 2.00 cm MV Mean grad:  2.0 mmHg MV Vmax:       1.16 m/s MV Vmean:      61.0 cm/s MV Decel Time: 304 msec MV E velocity: 66.40 cm/s MV A velocity: 98.10 cm/s MV E/A ratio:  0.68 Marsa Dooms MD Electronically signed by Marsa Dooms MD Signature Date/Time: 11/23/2024/1:16:13 PM    Final    CARDIAC CATHETERIZATION Result Date: 11/23/2024   Prox LAD lesion is 95% stenosed.   Mid LAD lesion is 20% stenosed.   A drug-eluting stent was successfully placed using a STENT ONYX FRONTIER 3.0X15.   Post intervention, there is a 0% residual stenosis.   The left ventricular systolic function is normal.   LV end diastolic pressure is normal.   The left ventricular ejection fraction is 55-65% by visual estimate. 1.  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  Normal left ventricular function 3.  Successful PCI with 3.0 x 15 mm Onyx frontier DES proximal LAD 4.  Hyperlipidemia with statin intolerance Recommendations 1.  Dual antiplatelet therapy uninterrupted x 1 year 2.  Will arrange for PCSK9 inhibitor therapy as outpatient   DG Chest 2 View Result Date: 11/22/2024 CLINICAL DATA:  Chest pain. EXAM: CHEST - 2 VIEW COMPARISON:  05/09/2015 FINDINGS: Lungs are hyperexpanded. The cardiopericardial silhouette is within normal limits for size. The lungs are clear without focal pneumonia, edema, pneumothorax or pleural effusion. No acute bony abnormality. IMPRESSION: No active cardiopulmonary disease. Electronically Signed   By: [name] M.D.   On: 11/22/2024 11:16   CT CHEST ABDOMEN PELVIS W CONTRAST Addendum Date: 11/21/2024 **ADDENDUM #1 **ADDENDUM: Normal  Template Error:  Post cholecystectomy   without complication ---------------------------------------------------- Electronically signed by: Norleen Boxer MD 11/21/2024 12:03 PM EST RP Workstation: [id number]   Result Date: 11/21/2024 **ORIGINAL REPORT ** EXAM: CT CHEST, ABDOMEN AND PELVIS WITH CONTRAST 10/31/2024 10:39:47 AM TECHNIQUE: CT of the chest, abdomen and pelvis was performed with the administration of intravenous contrast. 100 mL iohexol  (OMNIPAQUE ) 300 MG/ML solution was administered intravenously. Multiplanar reformatted images are provided for review. Automated exposure control, iterative reconstruction, and/or weight based adjustment of the mA/kV was utilized to reduce the radiation dose to as low as reasonably achievable. COMPARISON: PET scan 08/08/2024, CT  exam 04/30/2024. CLINICAL HISTORY: Cervical cancer, monitor; recurrent endometrial cancer with known hypermetabolic left axillary node. * Tracking Code: BO * FINDINGS: CHEST: MEDIASTINUM AND LYMPH NODES: Ascending thoracic aorta measures 37 mm when measured in coronal projection (image 52 series 4). Heart and pericardium are unremarkable. The central airways are clear. No mediastinal lymphadenopathy. Rounded left axillary lymph node measures 8 mm (image 21 of series 2) compared to 8 mm on CT 04/30/2024. No hilar lymphadenopathy. LUNGS AND PLEURA: No focal consolidation or pulmonary edema. No pleural effusion or pneumothorax. No suspicious pulmonary nodules. Nodule in the medial left lobe is unchanged. ABDOMEN AND PELVIS: LIVER: The liver is unremarkable. GALLBLADDER AND BILE DUCTS: Gallbladder is unremarkable. No biliary ductal dilatation. SPLEEN: No acute abnormality. PANCREAS: No acute abnormality. ADRENAL GLANDS: No acute abnormality. KIDNEYS, URETERS AND BLADDER: Staghorn type calcification lower pole of the right kidney. Unchanged from comparison exams. No stones in the left kidney or ureters. No hydronephrosis. No perinephric or periureteral stranding. Urinary bladder is  unremarkable. GI AND BOWEL: Stomach demonstrates no acute abnormality. There is no bowel obstruction. REPRODUCTIVE ORGANS: Post hysterectomy. No pelvic sidewall nodularity. PERITONEUM AND RETROPERITONEUM: No ascites. No free air. VASCULATURE: Aorta is normal in caliber. ABDOMINAL AND PELVIS LYMPH NODES: No lymphadenopathy in the pelvis. No inguinal adenopathy. No abdominal lymphadenopathy. BONES AND SOFT TISSUES: No acute osseous abnormality. No focal soft tissue abnormality. IMPRESSION: 1. No change in size of left axillary lymph node. 2. No evidence of recurrent or progressive cervical carcinoma. 3. Normal ascending thoracic aortic diameter when remeasured. Electronically signed by: Norleen Boxer MD 11/04/2024 04:50 PM EST RP Workstation: [id number]    Microbiology: Results for orders placed or performed in visit on 07/04/24  Microscopic Examination     Status: Abnormal   Collection Time: 07/04/24  9:31 AM   Urine  Result Value Ref Range Status   WBC, UA >30 (A) 0 - 5 /hpf Final   RBC, Urine 11-30 (A) 0 - 2 /hpf Final   Epithelial Cells (non renal) 0-10 0 - 10 /hpf Final   Mucus, UA Present (A) Not Estab. Final   Bacteria, UA Moderate (A) None seen/Few Final    Labs: CBC: Recent Labs  Lab 11/22/24 1022 11/23/24 0500 11/24/24 0340  WBC 5.7 6.5 7.1  HGB 12.7 12.8 12.5  HCT 39.4 40.0 38.4  MCV 87.4 88.5 86.9  PLT 465* 470* 465*   Basic Metabolic Panel: Recent Labs  Lab 11/22/24 1022 11/23/24 0500 11/24/24 0340  NA 140 140 139  K 4.3 4.0 3.5  CL 102 105 102  CO2 23 25 23   GLUCOSE 105* 112* 102*  BUN 19 17 21   CREATININE 0.89 0.89 0.95  CALCIUM  9.5 8.9 8.8*   Liver Function Tests: No results for input(s): AST, ALT, ALKPHOS, BILITOT, PROT, ALBUMIN in the last 168 hours. CBG: No results for input(s): GLUCAP in the last 168 hours.  Discharge time spent: greater than 30 minutes.  Signed: AIDA CHO, MD [facility] 11/24/2024

## 2024-11-24 NOTE — Progress Notes (Signed)
 [facility] Cardiology  SUBJECTIVE: Patient laying in bed, denies chest pain or shortness of breath   Vitals:   11/23/24 1635 11/23/24 1938 11/23/24 2304 11/24/24 0341  BP: (!) 152/80 97/65 118/62 135/65  Pulse: 78 92 75 69  Resp: 20 18 18 18   Temp: 98.4 F (36.9 C) 98 F (36.7 C) 98.4 F (36.9 C) 98.2 F (36.8 C)  TempSrc:      SpO2: 98% 96% 100% 93%  Weight:      Height:         Intake/Output Summary (Last 24 hours) at 11/24/2024 9077 Last data filed at 11/23/2024 2214 Gross per 24 hour  Intake 243 ml  Output --  Net 243 ml      PHYSICAL EXAM  General: Well developed, well nourished, in no acute distress HEENT:  Normocephalic and atramatic Neck:  No JVD.  Lungs: Clear bilaterally to auscultation and percussion. Heart: HRRR . Normal S1 and S2 without gallops or murmurs.  Abdomen: Bowel sounds are positive, abdomen soft and non-tender  Msk:  Back normal, normal gait. Normal strength and tone for age. Extremities: No clubbing, cyanosis or edema.   Neuro: Alert and oriented X 3. Psych:  Good affect, responds appropriately   LABS: Basic Metabolic Panel: Recent Labs    11/23/24 0500 11/24/24 0340  NA 140 139  K 4.0 3.5  CL 105 102  CO2 25 23  GLUCOSE 112* 102*  BUN 17 21  CREATININE 0.89 0.95  CALCIUM  8.9 8.8*   Liver Function Tests: No results for input(s): AST, ALT, ALKPHOS, BILITOT, PROT, ALBUMIN in the last 72 hours. No results for input(s): LIPASE, AMYLASE in the last 72 hours. CBC: Recent Labs    11/23/24 0500 11/24/24 0340  WBC 6.5 7.1  HGB 12.8 12.5  HCT 40.0 38.4  MCV 88.5 86.9  PLT 470* 465*   Cardiac Enzymes: No results for input(s): CKTOTAL, CKMB, CKMBINDEX, TROPONINI in the last 72 hours. BNP: Invalid input(s): POCBNP D-Dimer: Recent Labs    11/22/24 1253  DDIMER 0.72*   Hemoglobin A1C: No results for input(s): HGBA1C in the last 72 hours. Fasting Lipid Panel: No results for input(s): CHOL, HDL,  LDLCALC, TRIG, CHOLHDL, LDLDIRECT in the last 72 hours. Thyroid Function Tests: No results for input(s): TSH, T4TOTAL, T3FREE, THYROIDAB in the last 72 hours.  Invalid input(s): FREET3 Anemia Panel: No results for input(s): VITAMINB12, FOLATE, FERRITIN, TIBC, IRON, RETICCTPCT in the last 72 hours.  ECHOCARDIOGRAM COMPLETE Result Date: 11/23/2024    ECHOCARDIOGRAM REPORT   Patient Name:   Morgan Dalton Date of Exam: 11/23/2024 Medical Rec #:  [id number]     Height:       64.0 in Accession #:    [id number]    Weight:       202.0 lb Date of Birth:  November 30, 1951      BSA:          1.965 m Patient Age:    73 years      BP:           142/68 mmHg Patient Gender: F             HR:           74 bpm. Exam Location:  [facility] Procedure: 2D Echo, Color Doppler, Cardiac Doppler, Strain Analysis and 3D Echo            (Both Spectral and Color Flow Doppler were utilized during  procedure). Indications:     Chest pain R07.9  History:         Patient has no prior history of Echocardiogram examinations.                  Risk Factors:Hypertension and Dyslipidemia. Anxiety.  Sonographer:     Christopher Furnace Referring Phys:  [id number] CARALYN HUDSON Diagnosing Phys: Marsa Dooms MD  Sonographer Comments: Global longitudinal strain was attempted. IMPRESSIONS  1. Left ventricular ejection fraction, by estimation, is 60 to 65%. The left ventricle has normal function. The left ventricle has no regional wall motion abnormalities. Left ventricular diastolic parameters are indeterminate. The global longitudinal strain is abnormal.  2. Right ventricular systolic function is normal. The right ventricular size is normal.  3. The mitral valve is normal in structure. Trivial mitral valve regurgitation. No evidence of mitral stenosis.  4. The aortic valve is normal in structure. Aortic valve regurgitation is not visualized. No aortic stenosis is present.  5. The inferior vena cava is normal in size with  greater than 50% respiratory variability, suggesting right atrial pressure of 3 mmHg. FINDINGS  Left Ventricle: Left ventricular ejection fraction, by estimation, is 60 to 65%. The left ventricle has normal function. The left ventricle has no regional wall motion abnormalities. Strain was performed and the global longitudinal strain is abnormal. The left ventricular internal cavity size was normal in size. There is no left ventricular hypertrophy. Left ventricular diastolic parameters are indeterminate. Right Ventricle: The right ventricular size is normal. No increase in right ventricular wall thickness. Right ventricular systolic function is normal. Left Atrium: Left atrial size was normal in size. Right Atrium: Right atrial size was normal in size. Pericardium: There is no evidence of pericardial effusion. Mitral Valve: The mitral valve is normal in structure. Trivial mitral valve regurgitation. No evidence of mitral valve stenosis. MV peak gradient, 5.4 mmHg. The mean mitral valve gradient is 2.0 mmHg. Tricuspid Valve: The tricuspid valve is normal in structure. Tricuspid valve regurgitation is trivial. No evidence of tricuspid stenosis. Aortic Valve: The aortic valve is normal in structure. Aortic valve regurgitation is not visualized. No aortic stenosis is present. Aortic valve mean gradient measures 3.0 mmHg. Aortic valve peak gradient measures 6.4 mmHg. Aortic valve area, by VTI measures 2.59 cm. Pulmonic Valve: The pulmonic valve was normal in structure. Pulmonic valve regurgitation is not visualized. No evidence of pulmonic stenosis. Aorta: The aortic root is normal in size and structure. Venous: The inferior vena cava is normal in size with greater than 50% respiratory variability, suggesting right atrial pressure of 3 mmHg. IAS/Shunts: No atrial level shunt detected by color flow Doppler. Additional Comments: 3D was performed not requiring image post processing on an independent workstation and was  indeterminate.  LEFT VENTRICLE PLAX 2D LVIDd:         3.80 cm   Diastology LVIDs:         2.60 cm   LV e' medial:    4.35 cm/s LV PW:         1.00 cm   LV E/e' medial:  15.3 LV IVS:        1.10 cm   LV e' lateral:   6.53 cm/s LVOT diam:     2.00 cm   LV E/e' lateral: 10.2 LV SV:         65 LV SV Index:   33 LVOT Area:     3.14 cm  RIGHT VENTRICLE RV Basal diam:  2.80 cm  RV Mid diam:    2.30 cm LEFT ATRIUM             Index        RIGHT ATRIUM           Index LA diam:        2.30 cm 1.17 cm/m   RA Area:     11.60 cm LA Vol (A2C):   26.1 ml 13.28 ml/m  RA Volume:   22.60 ml  11.50 ml/m LA Vol (A4C):   21.9 ml 11.15 ml/m LA Biplane Vol: 24.9 ml 12.67 ml/m  AORTIC VALVE AV Area (Vmax):    2.54 cm AV Area (Vmean):   2.43 cm AV Area (VTI):     2.59 cm AV Vmax:           126.00 cm/s AV Vmean:          81.400 cm/s AV VTI:            0.251 m AV Peak Grad:      6.4 mmHg AV Mean Grad:      3.0 mmHg LVOT Vmax:         102.00 cm/s LVOT Vmean:        63.000 cm/s LVOT VTI:          0.207 m LVOT/AV VTI ratio: 0.82  AORTA Ao Root diam: 3.40 cm MITRAL VALVE MV Area (PHT): 2.50 cm    SHUNTS MV Area VTI:   2.50 cm    Systemic VTI:  0.21 m MV Peak grad:  5.4 mmHg    Systemic Diam: 2.00 cm MV Mean grad:  2.0 mmHg MV Vmax:       1.16 m/s MV Vmean:      61.0 cm/s MV Decel Time: 304 msec MV E velocity: 66.40 cm/s MV A velocity: 98.10 cm/s MV E/A ratio:  0.68 Marsa Dooms MD Electronically signed by Marsa Dooms MD Signature Date/Time: 11/23/2024/1:16:13 PM    Final    CARDIAC CATHETERIZATION Result Date: 11/23/2024   Prox LAD lesion is 95% stenosed.   Mid LAD lesion is 20% stenosed.   A drug-eluting stent was successfully placed using a STENT ONYX FRONTIER 3.0X15.   Post intervention, there is a 0% residual stenosis.   The left ventricular systolic function is normal.   LV end diastolic pressure is normal.   The left ventricular ejection fraction is 55-65% by visual estimate. 1.  One-vessel coronary artery  disease with 95% stenosis proximal LAD 2.  Normal left ventricular function 3.  Successful PCI with 3.0 x 15 mm Onyx frontier DES proximal LAD 4.  Hyperlipidemia with statin intolerance Recommendations 1.  Dual antiplatelet therapy uninterrupted x 1 year 2.  Will arrange for PCSK9 inhibitor therapy as outpatient   DG Chest 2 View Result Date: 11/22/2024 CLINICAL DATA:  Chest pain. EXAM: CHEST - 2 VIEW COMPARISON:  05/09/2015 FINDINGS: Lungs are hyperexpanded. The cardiopericardial silhouette is within normal limits for size. The lungs are clear without focal pneumonia, edema, pneumothorax or pleural effusion. No acute bony abnormality. IMPRESSION: No active cardiopulmonary disease. Electronically Signed   By: [name] M.D.   On: 11/22/2024 11:16     Echo EF 60-65%  TELEMETRY: Sinus rhythm:  ASSESSMENT AND PLAN:  Principal Problem:   Unstable angina (HCC)    1.  CAD with unstable angina, cardiac catheterization revealing 95% stenosis proximal LAD, underwent successful PCI with DES proximal LAD 2.  Hyperlipidemia, on low-dose rosuvastatin , with history of statin intolerance,  will seek authorization for PCSK9 inhibitor as outpatient 3.  Essential hypertension, on losartan    Commendations   1.  Dual antiplatelet therapy uninterrupted x 1 year (aspirin  and prasugrel ) 2.  Start PCSK9 inhibitor therapy as outpatient 3.  Discharge home today 4.  Follow-up with me in 1 week   Marsa Dooms, MD, PhD, FACC 11/24/2024 9:22 AM

## 2024-11-25 LAB — LIPOPROTEIN A (LPA): Lipoprotein (a): 93.6 nmol/L — ABNORMAL HIGH (ref <75.0)

## 2024-11-26 ENCOUNTER — Encounter: Payer: Self-pay | Admitting: Cardiology

## 2024-12-02 ENCOUNTER — Other Ambulatory Visit: Payer: Self-pay | Admitting: Nurse Practitioner

## 2024-12-02 DIAGNOSIS — C549 Malignant neoplasm of corpus uteri, unspecified: Secondary | ICD-10-CM

## 2024-12-14 ENCOUNTER — Encounter: Attending: Cardiology | Admitting: *Deleted

## 2024-12-14 DIAGNOSIS — Z955 Presence of coronary angioplasty implant and graft: Secondary | ICD-10-CM | POA: Insufficient documentation

## 2024-12-14 DIAGNOSIS — Z48812 Encounter for surgical aftercare following surgery on the circulatory system: Secondary | ICD-10-CM | POA: Insufficient documentation

## 2024-12-14 NOTE — Progress Notes (Signed)
 Initial phone call completed. Diagnosis can be found in [facility] 12/1. EP Orientation scheduled for Wednesday 1/7 at 10:30 am.

## 2024-12-19 ENCOUNTER — Encounter

## 2024-12-19 VITALS — Ht 64.5 in | Wt 203.3 lb

## 2024-12-19 DIAGNOSIS — I25118 Atherosclerotic heart disease of native coronary artery with other forms of angina pectoris: Secondary | ICD-10-CM | POA: Insufficient documentation

## 2024-12-19 DIAGNOSIS — Z955 Presence of coronary angioplasty implant and graft: Secondary | ICD-10-CM

## 2024-12-19 NOTE — Progress Notes (Signed)
 Cardiac Individual Treatment Plan  Patient Details  Name: Morgan Dalton MRN: [id number] Date of Birth: 02-Apr-1951 Referring Provider:   Flowsheet Row Cardiac Rehab from 12/19/2024 in [facility] Cardiac and Pulmonary Rehab  Referring Provider Dr. Ammon    Initial Encounter Date:  Flowsheet Row Cardiac Rehab from 12/19/2024 in [facility] Cardiac and Pulmonary Rehab  Date 12/19/24    Visit Diagnosis: Status post coronary artery stent placement  Patient's Home Medications on Admission: Current Medications[1]  Past Medical History: Past Medical History:  Diagnosis Date   Ankle fracture, left    Anxiety    Cancer (HCC)    Uterine Cancer   Dyspepsia    Endometrial cancer (HCC)    GERD (gastroesophageal reflux disease)    History of kidney stones    Hx of dysplastic nevus 10/16/2010   Right lower leg. Severe atypia. Excised: 11/25/2010. 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 Well differentiated. Tx: EDC    Tobacco Use: Tobacco Use History[2]  Labs: Review Flowsheet        No data to display           Exercise Target Goals: Exercise Program Goal: Individual exercise prescription set using results from initial 6 min walk test and THRR while considering  patients activity barriers and safety.   Exercise Prescription Goal: Initial exercise prescription builds to 30-45 minutes a day of aerobic activity, 2-3 days per week.  Home exercise guidelines will be given to patient during program as part of exercise prescription that the participant will acknowledge.   Education: Aerobic Exercise: - Group verbal and visual presentation on the components of exercise prescription. Introduces F.I.T.T principle from ACSM for exercise prescriptions.  Reviews F.I.T.T. principles of aerobic exercise including progression. Written material provided at  class time.   Education: Resistance Exercise: - Group verbal and visual presentation on the components of exercise prescription. Introduces F.I.T.T principle from ACSM for exercise prescriptions  Reviews F.I.T.T. principles of resistance exercise including progression. Written material provided at class time.    Education: Exercise & Equipment Safety: - Individual verbal instruction and demonstration of equipment use and safety with use of the equipment. Flowsheet Row Cardiac Rehab from 12/19/2024 in [facility] Cardiac and Pulmonary Rehab  Date 12/19/24  Educator [facility]  Instruction Review Code 1- Verbalizes Understanding    Education: Exercise Physiology & General Exercise Guidelines: - Group verbal and written instruction with models to review the exercise physiology of the cardiovascular system and associated critical values. Provides general exercise guidelines with specific guidelines to those with heart or lung disease. Written material provided at class time.   Education: Flexibility, Balance, Mind/Body Relaxation: - Group verbal and visual presentation with interactive activity on the components of exercise prescription. Introduces F.I.T.T principle from ACSM for exercise prescriptions. Reviews F.I.T.T. principles of flexibility and balance exercise training including progression. Also discusses the mind body connection.  Reviews various relaxation techniques to help reduce and manage stress (i.e. Deep breathing, progressive muscle relaxation, and visualization). Balance handout provided to take home. Written material provided at class time.   Activity Barriers & Risk Stratification:  Activity Barriers & Cardiac Risk Stratification - 12/19/24 1154       Activity Barriers & Cardiac Risk Stratification   Activity Barriers None    Cardiac Risk Stratification Moderate  6 Minute Walk:  6 Minute Walk     Row Name 12/19/24 1153         6 Minute Walk   Phase Initial      Distance 1300 feet     Walk Time 6 minutes     # of Rest Breaks 0     MPH 2.46     METS 2.35     RPE 10     Perceived Dyspnea  1     VO2 Peak 8.22     Symptoms No     Resting HR 73 bpm     Resting BP 136/72     Resting Oxygen Saturation  97 %     Exercise Oxygen Saturation  during 6 min walk 98 %     Max Ex. HR 107 bpm     Max Ex. BP 140/70     2 Minute Post BP 126/60        Oxygen Initial Assessment:   Oxygen Re-Evaluation:   Oxygen Discharge (Final Oxygen Re-Evaluation):   Initial Exercise Prescription:  Initial Exercise Prescription - 12/19/24 1100       Date of Initial Exercise RX and Referring Provider   Date 12/19/24    Referring Provider Dr. Ammon      Oxygen   Maintain Oxygen Saturation 88% or higher      Treadmill   MPH 2    Grade 0.5    Minutes 15    METs 2.67      Recumbant Bike   Level 2    RPM 50    Watts 20    Minutes 15    METs 2.35      NuStep   Level 2    SPM 80    Minutes 15    METs 2.35      REL-XR   Level 2    Speed 50    Minutes 15    METs 2.35      T5 Nustep   Level 2    SPM 80    Minutes 15    METs 2.35      Track   Laps 30    Minutes 15    METs 2.63      Prescription Details   Frequency (times per week) 3    Duration Progress to 30 minutes of continuous aerobic without signs/symptoms of physical distress      Intensity   THRR 40-80% of Max Heartrate 101-130    Ratings of Perceived Exertion 11-13    Perceived Dyspnea 0-4      Progression   Progression Continue to progress workloads to maintain intensity without signs/symptoms of physical distress.      Resistance Training   Training Prescription Yes    Weight 3    Reps 10-15          Perform Capillary Blood Glucose checks as needed.  Exercise Prescription Changes:   Exercise Prescription Changes     Row Name 12/19/24 1100             Response to Exercise   Blood Pressure (Admit) 136/72       Blood Pressure (Exercise) 140/70        Blood Pressure (Exit) 126/60       Heart Rate (Admit) 73 bpm       Heart Rate (Exercise) 107 bpm       Heart Rate (Exit) 76 bpm       Oxygen Saturation (  Admit) 97 %       Oxygen Saturation (Exercise) 98 %       Oxygen Saturation (Exit) 98 %       Rating of Perceived Exertion (Exercise) 10       Perceived Dyspnea (Exercise) 1       Symptoms none       Comments 6 MWT results          Exercise Comments:   Exercise Goals and Review:   Exercise Goals     Row Name 12/19/24 1158             Exercise Goals   Increase Physical Activity Yes       Intervention Provide advice, education, support and counseling about physical activity/exercise needs.;Develop an individualized exercise prescription for aerobic and resistive training based on initial evaluation findings, risk stratification, comorbidities and participant's personal goals.       Expected Outcomes Short Term: Attend rehab on a regular basis to increase amount of physical activity.;Long Term: Add in home exercise to make exercise part of routine and to increase amount of physical activity.;Long Term: Exercising regularly at least 3-5 days a week.       Increase Strength and Stamina Yes       Intervention Provide advice, education, support and counseling about physical activity/exercise needs.;Develop an individualized exercise prescription for aerobic and resistive training based on initial evaluation findings, risk stratification, comorbidities and participant's personal goals.       Expected Outcomes Short Term: Increase workloads from initial exercise prescription for resistance, speed, and METs.;Short Term: Perform resistance training exercises routinely during rehab and add in resistance training at home;Long Term: Improve cardiorespiratory fitness, muscular endurance and strength as measured by increased METs and functional capacity ( )       Able to understand and use rate of perceived exertion (RPE) scale Yes        Intervention Provide education and explanation on how to use RPE scale       Expected Outcomes Short Term: Able to use RPE daily in rehab to express subjective intensity level;Long Term:  Able to use RPE to guide intensity level when exercising independently       Able to understand and use Dyspnea scale Yes       Intervention Provide education and explanation on how to use Dyspnea scale       Expected Outcomes Short Term: Able to use Dyspnea scale daily in rehab to express subjective sense of shortness of breath during exertion;Long Term: Able to use Dyspnea scale to guide intensity level when exercising independently       Knowledge and understanding of Target Heart Rate Range (THRR) Yes       Intervention Provide education and explanation of THRR including how the numbers were predicted and where they are located for reference       Expected Outcomes Short Term: Able to state/look up THRR;Long Term: Able to use THRR to govern intensity when exercising independently;Short Term: Able to use daily as guideline for intensity in rehab       Able to check pulse independently Yes       Intervention Provide education and demonstration on how to check pulse in carotid and radial arteries.;Review the importance of being able to check your own pulse for safety during independent exercise       Expected Outcomes Short Term: Able to explain why pulse checking is important during independent exercise;Long Term: Able to check pulse independently  and accurately       Understanding of Exercise Prescription Yes       Intervention Provide education, explanation, and written materials on patient's individual exercise prescription       Expected Outcomes Short Term: Able to explain program exercise prescription;Long Term: Able to explain home exercise prescription to exercise independently          Exercise Goals Re-Evaluation :   Discharge Exercise Prescription (Final Exercise Prescription Changes):  Exercise  Prescription Changes - 12/19/24 1100       Response to Exercise   Blood Pressure (Admit) 136/72    Blood Pressure (Exercise) 140/70    Blood Pressure (Exit) 126/60    Heart Rate (Admit) 73 bpm    Heart Rate (Exercise) 107 bpm    Heart Rate (Exit) 76 bpm    Oxygen Saturation (Admit) 97 %    Oxygen Saturation (Exercise) 98 %    Oxygen Saturation (Exit) 98 %    Rating of Perceived Exertion (Exercise) 10    Perceived Dyspnea (Exercise) 1    Symptoms none    Comments 6 MWT results          Nutrition:  Target Goals: Understanding of nutrition guidelines, daily intake of sodium 1500mg , cholesterol 200mg , calories 30% from fat and 7% or less from saturated fats, daily to have 5 or more servings of fruits and vegetables.  Education: Nutrition 1 -Group instruction provided by verbal, written material, interactive activities, discussions, models, and posters to present general guidelines for heart healthy nutrition including macronutrients, label reading, and promoting whole foods over processed counterparts. Education serves as pensions consultant of discussion of heart healthy eating for all. Written material provided at class time.    Education: Nutrition 2 -Group instruction provided by verbal, written material, interactive activities, discussions, models, and posters to present general guidelines for heart healthy nutrition including sodium, cholesterol, and saturated fat. Providing guidance of habit forming to improve blood pressure, cholesterol, and body weight. Written material provided at class time.     Biometrics:  Pre Biometrics - 12/19/24 1158       Pre Biometrics   Height 5' 4.5 (1.638 m)    Weight 203 lb 4.8 oz (92.2 kg)    Waist Circumference 34.5 inches    Hip Circumference 50 inches    Waist to Hip Ratio 0.69 %    BMI (Calculated) 34.37    Single Leg Stand 16.16 seconds           Nutrition Therapy Plan and Nutrition Goals:  Nutrition Therapy & Goals - 12/19/24  1200       Intervention Plan   Intervention Prescribe, educate and counsel regarding individualized specific dietary modifications aiming towards targeted core components such as weight, hypertension, lipid management, diabetes, heart failure and other comorbidities.    Expected Outcomes Short Term Goal: Understand basic principles of dietary content, such as calories, fat, sodium, cholesterol and nutrients.;Short Term Goal: A plan has been developed with personal nutrition goals set during dietitian appointment.;Long Term Goal: Adherence to prescribed nutrition plan.          Nutrition Assessments:  MEDIFICTS Score Key: >=70 Need to make dietary changes  40-70 Heart Healthy Diet <= 40 Therapeutic Level Cholesterol Diet  Flowsheet Row Cardiac Rehab from 12/19/2024 in [facility] Cardiac and Pulmonary Rehab  Picture Your Plate Total Score on Admission 66   Picture Your Plate Scores: <59 Unhealthy dietary pattern with much room for improvement. 41-50 Dietary pattern unlikely to meet recommendations for  good health and room for improvement. 51-60 More healthful dietary pattern, with some room for improvement.  >60 Healthy dietary pattern, although there may be some specific behaviors that could be improved.    Nutrition Goals Re-Evaluation:   Nutrition Goals Discharge (Final Nutrition Goals Re-Evaluation):   Psychosocial: Target Goals: Acknowledge presence or absence of significant depression and/or stress, maximize coping skills, provide positive support system. Participant is able to verbalize types and ability to use techniques and skills needed for reducing stress and depression.   Education: Stress, Anxiety, and Depression - Group verbal and visual presentation to define topics covered.  Reviews how body is impacted by stress, anxiety, and depression.  Also discusses healthy ways to reduce stress and to treat/manage anxiety and depression. Written material provided at class  time.   Education: Sleep Hygiene -Provides group verbal and written instruction about how sleep can affect your health.  Define sleep hygiene, discuss sleep cycles and impact of sleep habits. Review good sleep hygiene tips.   Initial Review & Psychosocial Screening:  Initial Psych Review & Screening - 12/14/24 1437       Initial Review   Current issues with History of Depression;Current Psychotropic Meds      Family Dynamics   Good Support System? Yes      Barriers   Psychosocial barriers to participate in program There are no identifiable barriers or psychosocial needs.      Screening Interventions   Interventions Encouraged to exercise;Provide feedback about the scores to participant;To provide support and resources with identified psychosocial needs    Expected Outcomes Short Term goal: Utilizing psychosocial counselor, staff and physician to assist with identification of specific Stressors or current issues interfering with healing process. Setting desired goal for each stressor or current issue identified.;Long Term Goal: Stressors or current issues are controlled or eliminated.;Short Term goal: Identification and review with participant of any Quality of Life or Depression concerns found by scoring the questionnaire.;Long Term goal: The participant improves quality of Life and PHQ9 Scores as seen by post scores and/or verbalization of changes          Quality of Life Scores:   Quality of Life - 12/19/24 1159       Quality of Life   Select Quality of Life      Quality of Life Scores   Health/Function Pre 22.03 %    Socioeconomic Pre 23.75 %    Psych/Spiritual Pre 26.86 %    Family Pre 29.5 %    GLOBAL Pre 24.46 %         Scores of 19 and below usually indicate a poorer quality of life in these areas.  A difference of  2-3 points is a clinically meaningful difference.  A difference of 2-3 points in the total score of the Quality of Life Index has been associated with  significant improvement in overall quality of life, self-image, physical symptoms, and general health in studies assessing change in quality of life.  PHQ-9: Review Flowsheet       12/19/2024 11/21/2024 11/01/2024 03/18/2023  Depression screen PHQ 2/9  Decreased Interest 0 0 0 0  Down, Depressed, Hopeless 0 0 0 0  PHQ - 2 Score 0 0 0 0  Altered sleeping 1 - - -  Tired, decreased energy 0 - - -  Change in appetite 0 - - -  Feeling bad or failure about yourself  0 - - -  Trouble concentrating 0 - - -  Moving slowly or  fidgety/restless 0 - - -  Suicidal thoughts 0 - - -  PHQ-9 Score 1 - - -  Difficult doing work/chores Not difficult at all - - -   Interpretation of Total Score  Total Score Depression Severity:  1-4 = Minimal depression, 5-9 = Mild depression, 10-14 = Moderate depression, 15-19 = Moderately severe depression, 20-27 = Severe depression   Psychosocial Evaluation and Intervention:  Psychosocial Evaluation - 12/14/24 1451       Psychosocial Evaluation & Interventions   Interventions Encouraged to exercise with the program and follow exercise prescription    Comments Ms. Kinoshita is coming to cardiac rehab after a stent placement. She states she can notice an improvement in her shortness of breath and is looking forward to getting back to walking. She has been on Zoloft  ever since her cancer diagnosis that was around the same time when she lost multiple family members. She is unsure is she still needs that dose, but states she feels mentally stable so she plans on staying on it. She enjoys reading and gardening and is looking forward to continuing to do both.    Expected Outcomes Short: attend cardiac rehab for education and exercise Long: develo and maintain positive self care habits    Continue Psychosocial Services  Follow up required by staff          Psychosocial Re-Evaluation:   Psychosocial Discharge (Final Psychosocial Re-Evaluation):   Vocational  Rehabilitation: Provide vocational rehab assistance to qualifying candidates.   Vocational Rehab Evaluation & Intervention:  Vocational Rehab - 12/14/24 1436       Initial Vocational Rehab Evaluation & Intervention   Assessment shows need for Vocational Rehabilitation No          Education: Education Goals: Education classes will be provided on a variety of topics geared toward better understanding of heart health and risk factor modification. Participant will state understanding/return demonstration of topics presented as noted by education test scores.  Learning Barriers/Preferences:  Learning Barriers/Preferences - 12/14/24 1436       Learning Barriers/Preferences   Learning Barriers None    Learning Preferences None          General Cardiac Education Topics:  AED/CPR: - Group verbal and written instruction with the use of models to demonstrate the basic use of the AED with the basic ABC's of resuscitation.   Test and Procedures: - Group verbal and visual presentation and models provide information about basic cardiac anatomy and function. Reviews the testing methods done to diagnose heart disease and the outcomes of the test results. Describes the treatment choices: Medical Management, Angioplasty, or Coronary Bypass Surgery for treating various heart conditions including Myocardial Infarction, Angina, Valve Disease, and Cardiac Arrhythmias. Written material provided at class time.   Medication Safety: - Group verbal and visual instruction to review commonly prescribed medications for heart and lung disease. Reviews the medication, class of the drug, and side effects. Includes the steps to properly store meds and maintain the prescription regimen. Written material provided at class time.   Intimacy: - Group verbal instruction through game format to discuss how heart and lung disease can affect sexual intimacy. Written material provided at class time.   Know Your  Numbers and Heart Failure: - Group verbal and visual instruction to discuss disease risk factors for cardiac and pulmonary disease and treatment options.  Reviews associated critical values for Overweight/Obesity, Hypertension, Cholesterol, and Diabetes.  Discusses basics of heart failure: signs/symptoms and treatments.  Introduces Heart Failure Zone  chart for action plan for heart failure. Written material provided at class time.   Infection Prevention: - Provides verbal and written material to individual with discussion of infection control including proper hand washing and proper equipment cleaning during exercise session. Flowsheet Row Cardiac Rehab from 12/19/2024 in [facility] Cardiac and Pulmonary Rehab  Date 12/19/24  Educator [facility]  Instruction Review Code 1- Verbalizes Understanding    Falls Prevention: - Provides verbal and written material to individual with discussion of falls prevention and safety. Flowsheet Row Cardiac Rehab from 12/19/2024 in [facility] Cardiac and Pulmonary Rehab  Date 12/19/24  Educator [facility]  Instruction Review Code 1- Verbalizes Understanding    Other: -Provides group and verbal instruction on various topics (see comments)   Knowledge Questionnaire Score:  Knowledge Questionnaire Score - 12/19/24 1200       Knowledge Questionnaire Score   Pre Score 25/26          Core Components/Risk Factors/Patient Goals at Admission:  Personal Goals and Risk Factors at Admission - 12/19/24 1200       Core Components/Risk Factors/Patient Goals on Admission    Weight Management Yes;Weight Loss    Intervention Weight Management: Develop a combined nutrition and exercise program designed to reach desired caloric intake, while maintaining appropriate intake of nutrient and fiber, sodium and fats, and appropriate energy expenditure required for the weight goal.;Weight Management: Provide education and appropriate resources to help participant work on and attain dietary goals.     Admit Weight 203 lb 4.8 oz (92.2 kg)    Goal Weight: Short Term 200 lb (90.7 kg)    Goal Weight: Long Term 190 lb (86.2 kg)    Expected Outcomes Short Term: Continue to assess and modify interventions until short term weight is achieved;Long Term: Adherence to nutrition and physical activity/exercise program aimed toward attainment of established weight goal;Weight Loss: Understanding of general recommendations for a balanced deficit meal plan, which promotes 1-2 lb weight loss per week and includes a negative energy balance of [phone] kcal/d;Understanding recommendations for meals to include 15-35% energy as protein, 25-35% energy from fat, 35-60% energy from carbohydrates, less than 200mg  of dietary cholesterol, 20-35 gm of total fiber daily;Understanding of distribution of calorie intake throughout the day with the consumption of 4-5 meals/snacks    Hypertension Yes    Intervention Provide education on lifestyle modifcations including regular physical activity/exercise, weight management, moderate sodium restriction and increased consumption of fresh fruit, vegetables, and low fat dairy, alcohol moderation, and smoking cessation.;Monitor prescription use compliance.    Expected Outcomes Short Term: Continued assessment and intervention until BP is < 140/22mm HG in hypertensive participants. < 130/74mm HG in hypertensive participants with diabetes, heart failure or chronic kidney disease.;Long Term: Maintenance of blood pressure at goal levels.    Lipids Yes    Intervention Provide education and support for participant on nutrition & aerobic/resistive exercise along with prescribed medications to achieve LDL 70mg , HDL >40mg .    Expected Outcomes Short Term: Participant states understanding of desired cholesterol values and is compliant with medications prescribed. Participant is following exercise prescription and nutrition guidelines.;Long Term: Cholesterol controlled with medications as prescribed,  with individualized exercise RX and with personalized nutrition plan. Value goals: LDL < 70mg , HDL > 40 mg.          Education:Diabetes - Individual verbal and written instruction to review signs/symptoms of diabetes, desired ranges of glucose level fasting, after meals and with exercise. Acknowledge that pre and post exercise glucose checks will be done  for 3 sessions at entry of program.   Core Components/Risk Factors/Patient Goals Review:    Core Components/Risk Factors/Patient Goals at Discharge (Final Review):    ITP Comments:  ITP Comments     Row Name 12/14/24 1516 12/19/24 1153         ITP Comments Initial phone call completed. Diagnosis can be found in [facility] 12/1. EP Orientation scheduled for Wednesday 1/7 at 10:30 am. Completed and gym orientation for cardiac rehab. Initial ITP created and sent for review to Dr. Oneil Pinal, Medical Director.         Comments: initial ITP    [1]  Current Outpatient Medications:    albuterol  (VENTOLIN  HFA) 108 (90 Base) MCG/ACT inhaler, Inhale 2 puffs into the lungs every 6 (six) hours as needed for wheezing. (Patient not taking: Reported on 11/22/2024), Disp: , Rfl:    ascorbic acid (VITAMIN C) 500 MG tablet, Take 500 mg by mouth daily. Patient states she takes it October - April., Disp: , Rfl:    aspirin  EC 81 MG tablet, Take 81 mg by mouth daily. , Disp: , Rfl:    calcium  carbonate (OS-CAL - DOSED IN MG OF ELEMENTAL CALCIUM ) 1250 (500 Ca) MG tablet, Take by mouth., Disp: , Rfl:    cholecalciferol (VITAMIN D3) 25 MCG (1000 UNIT) tablet, Take 1,000 Units by mouth daily., Disp: , Rfl:    Coenzyme Q10 (CO Q-10) 100 MG CAPS, Take 100 mg by mouth daily., Disp: , Rfl:    Cranberry 500 MG TABS, Take by mouth., Disp: , Rfl:    ezetimibe  (ZETIA ) 10 MG tablet, Take 10 mg by mouth daily., Disp: , Rfl:    Glucosamine-Chondroitin 750-600 MG TABS, Take 1 tablet by mouth daily., Disp: , Rfl:    hydrochlorothiazide  (HYDRODIURIL ) 25 MG tablet,  Take 12.5 mg by mouth daily., Disp: , Rfl:    Krill Oil 1000 MG CAPS, Take by mouth., Disp: , Rfl:    letrozole  (FEMARA ) 2.5 MG tablet, TAKE 1 TABLET(2.5 MG) BY MOUTH DAILY, Disp: 90 tablet, Rfl: 3   levothyroxine  (SYNTHROID , LEVOTHROID) 25 MCG tablet, Take 25 mcg by mouth daily before breakfast., Disp: , Rfl:    losartan  (COZAAR ) 50 MG tablet, Take 1 tablet by mouth daily., Disp: , Rfl:    Multiple Vitamins-Minerals (WOMENS 50+ MULTI VITAMIN PO), Take by mouth., Disp: , Rfl:    pantoprazole  (PROTONIX ) 40 MG tablet, Take 40 mg by mouth every other day., Disp: , Rfl:    prasugrel  (EFFIENT ) 10 MG TABS tablet, Take 1 tablet (10 mg total) by mouth daily., Disp: 30 tablet, Rfl: 0   rosuvastatin  (CRESTOR ) 20 MG tablet, Take 1 tablet (20 mg total) by mouth every evening., Disp: 30 tablet, Rfl: 0   sertraline  (ZOLOFT ) 50 MG tablet, Take 50 mg by mouth daily., Disp: , Rfl:  [2]  Social History Tobacco Use  Smoking Status Never  Smokeless Tobacco Never

## 2024-12-19 NOTE — Patient Instructions (Signed)
 Patient Instructions  Patient Details  Name: Morgan Dalton MRN: [id number] Date of Birth: 1951-09-06 Referring Provider:  Ammon Blunt, MD  Below are your personal goals for exercise, nutrition, and risk factors. Our goal is to help you stay on track towards obtaining and maintaining these goals. We will be discussing your progress on these goals with you throughout the program.  Initial Exercise Prescription:  Initial Exercise Prescription - 12/19/24 1100       Date of Initial Exercise RX and Referring Provider   Date 12/19/24    Referring Provider Dr. Ammon      Oxygen   Maintain Oxygen Saturation 88% or higher      Treadmill   MPH 2    Grade 0.5    Minutes 15    METs 2.67      Recumbant Bike   Level 2    RPM 50    Watts 20    Minutes 15    METs 2.35      NuStep   Level 2    SPM 80    Minutes 15    METs 2.35      REL-XR   Level 2    Speed 50    Minutes 15    METs 2.35      T5 Nustep   Level 2    SPM 80    Minutes 15    METs 2.35      Track   Laps 30    Minutes 15    METs 2.63      Prescription Details   Frequency (times per week) 3    Duration Progress to 30 minutes of continuous aerobic without signs/symptoms of physical distress      Intensity   THRR 40-80% of Max Heartrate 101-130    Ratings of Perceived Exertion 11-13    Perceived Dyspnea 0-4      Progression   Progression Continue to progress workloads to maintain intensity without signs/symptoms of physical distress.      Resistance Training   Training Prescription Yes    Weight 3    Reps 10-15          Exercise Goals: Frequency: Be able to perform aerobic exercise two to three times per week in program working toward 2-5 days per week of home exercise.  Intensity: Work with a perceived exertion of 11 (fairly light) - 15 (hard) while following your exercise prescription.  We will make changes to your prescription with you as you progress through the program.    Duration: Be able to do 30 to 45 minutes of continuous aerobic exercise in addition to a 5 minute warm-up and a 5 minute cool-down routine.   Nutrition Goals: Your personal nutrition goals will be established when you do your nutrition analysis with the dietician.  The following are general nutrition guidelines to follow: Cholesterol < 200mg /day Sodium < 1500mg /day Fiber: Women over 50 yrs - 21 grams per day  Personal Goals:  Personal Goals and Risk Factors at Admission - 12/19/24 1200       Core Components/Risk Factors/Patient Goals on Admission    Weight Management Yes;Weight Loss    Intervention Weight Management: Develop a combined nutrition and exercise program designed to reach desired caloric intake, while maintaining appropriate intake of nutrient and fiber, sodium and fats, and appropriate energy expenditure required for the weight goal.;Weight Management: Provide education and appropriate resources to help participant work on and attain dietary goals.  Admit Weight 203 lb 4.8 oz (92.2 kg)    Goal Weight: Short Term 200 lb (90.7 kg)    Goal Weight: Long Term 190 lb (86.2 kg)    Expected Outcomes Short Term: Continue to assess and modify interventions until short term weight is achieved;Long Term: Adherence to nutrition and physical activity/exercise program aimed toward attainment of established weight goal;Weight Loss: Understanding of general recommendations for a balanced deficit meal plan, which promotes 1-2 lb weight loss per week and includes a negative energy balance of [phone] kcal/d;Understanding recommendations for meals to include 15-35% energy as protein, 25-35% energy from fat, 35-60% energy from carbohydrates, less than 200mg  of dietary cholesterol, 20-35 gm of total fiber daily;Understanding of distribution of calorie intake throughout the day with the consumption of 4-5 meals/snacks    Hypertension Yes    Intervention Provide education on lifestyle modifcations  including regular physical activity/exercise, weight management, moderate sodium restriction and increased consumption of fresh fruit, vegetables, and low fat dairy, alcohol moderation, and smoking cessation.;Monitor prescription use compliance.    Expected Outcomes Short Term: Continued assessment and intervention until BP is < 140/40mm HG in hypertensive participants. < 130/35mm HG in hypertensive participants with diabetes, heart failure or chronic kidney disease.;Long Term: Maintenance of blood pressure at goal levels.    Lipids Yes    Intervention Provide education and support for participant on nutrition & aerobic/resistive exercise along with prescribed medications to achieve LDL 70mg , HDL >40mg .    Expected Outcomes Short Term: Participant states understanding of desired cholesterol values and is compliant with medications prescribed. Participant is following exercise prescription and nutrition guidelines.;Long Term: Cholesterol controlled with medications as prescribed, with individualized exercise RX and with personalized nutrition plan. Value goals: LDL < 70mg , HDL > 40 mg.          Tobacco Use Initial Evaluation: Social History   Tobacco Use  Smoking Status Never  Smokeless Tobacco Never    Exercise Goals and Review:  Exercise Goals     Row Name 12/19/24 1158             Exercise Goals   Increase Physical Activity Yes       Intervention Provide advice, education, support and counseling about physical activity/exercise needs.;Develop an individualized exercise prescription for aerobic and resistive training based on initial evaluation findings, risk stratification, comorbidities and participant's personal goals.       Expected Outcomes Short Term: Attend rehab on a regular basis to increase amount of physical activity.;Long Term: Add in home exercise to make exercise part of routine and to increase amount of physical activity.;Long Term: Exercising regularly at least 3-5  days a week.       Increase Strength and Stamina Yes       Intervention Provide advice, education, support and counseling about physical activity/exercise needs.;Develop an individualized exercise prescription for aerobic and resistive training based on initial evaluation findings, risk stratification, comorbidities and participant's personal goals.       Expected Outcomes Short Term: Increase workloads from initial exercise prescription for resistance, speed, and METs.;Short Term: Perform resistance training exercises routinely during rehab and add in resistance training at home;Long Term: Improve cardiorespiratory fitness, muscular endurance and strength as measured by increased METs and functional capacity ( )       Able to understand and use rate of perceived exertion (RPE) scale Yes       Intervention Provide education and explanation on how to use RPE scale       Expected  Outcomes Short Term: Able to use RPE daily in rehab to express subjective intensity level;Long Term:  Able to use RPE to guide intensity level when exercising independently       Able to understand and use Dyspnea scale Yes       Intervention Provide education and explanation on how to use Dyspnea scale       Expected Outcomes Short Term: Able to use Dyspnea scale daily in rehab to express subjective sense of shortness of breath during exertion;Long Term: Able to use Dyspnea scale to guide intensity level when exercising independently       Knowledge and understanding of Target Heart Rate Range (THRR) Yes       Intervention Provide education and explanation of THRR including how the numbers were predicted and where they are located for reference       Expected Outcomes Short Term: Able to state/look up THRR;Long Term: Able to use THRR to govern intensity when exercising independently;Short Term: Able to use daily as guideline for intensity in rehab       Able to check pulse independently Yes       Intervention Provide  education and demonstration on how to check pulse in carotid and radial arteries.;Review the importance of being able to check your own pulse for safety during independent exercise       Expected Outcomes Short Term: Able to explain why pulse checking is important during independent exercise;Long Term: Able to check pulse independently and accurately       Understanding of Exercise Prescription Yes       Intervention Provide education, explanation, and written materials on patient's individual exercise prescription       Expected Outcomes Short Term: Able to explain program exercise prescription;Long Term: Able to explain home exercise prescription to exercise independently          Copy of goals given to participant.

## 2024-12-21 ENCOUNTER — Encounter: Admitting: Emergency Medicine

## 2024-12-21 DIAGNOSIS — Z955 Presence of coronary angioplasty implant and graft: Secondary | ICD-10-CM

## 2024-12-21 NOTE — Progress Notes (Signed)
 Daily Session Note  Patient Details  Name: Morgan Dalton MRN: [id number] Date of Birth: 05-21-1951 Referring Provider:   Flowsheet Row Cardiac Rehab from 12/19/2024 in [facility] Cardiac [facility] Pul[facility] Rehab  Referring Provider Dr. Ammon    Encounter Date: 12/21/2024  Check In:  Session Check In - 12/21/24 0909       Check-In   Supervising physician immediately available to respond to emergencies See telemetry face sheet for immediately available ER MD    Location [facility]-Cardiac & Pulmonary Rehab    Staff Present Leita Franks RN,BSN;[facility] BS, Exercise Physiologist;Noah Tickle, BS, Exercise Physiologist    Virtual Visit No    Medication changes reported     No    Fall or balance concerns reported    No    Warm-up and Cool-down Performed on first and last piece of equipment    Resistance Training Performed Yes    VAD Patient? No    PAD/SET Patient? No      Pain Assessment   Currently in Pain? No/denies             Tobacco Use History[1]  Goals Met:  Independence with exercise equipment Exercise tolerated well No report of concerns or symptoms today Strength training completed today  Goals Unmet:  Not Applicable  Comments: First full day of exercise!  Patient was oriented to gym and equipment including functions, settings, policies, and procedures.  Patient's individual exercise prescription and treatment plan were reviewed.  All starting workloads were established based on the results of the 6 minute walk test done at initial orientation visit.  The plan for exercise progression was also introduced and progression will be customized based on patient's performance and goals.    Dr. Oneil Pinal is Medical Director for [facility] Cardiac Rehabilitation.  Dr. Fuad Aleskerov is Medical Director for [facility] Pulmonary Rehabilitation.    [1]  Social History Tobacco Use  Smoking Status Never  Smokeless Tobacco Never

## 2024-12-24 ENCOUNTER — Encounter

## 2024-12-24 DIAGNOSIS — Z955 Presence of coronary angioplasty implant and graft: Secondary | ICD-10-CM

## 2024-12-24 NOTE — Progress Notes (Signed)
 Daily Session Note  Patient Details  Name: Morgan Dalton MRN: [id number] Date of Birth: 01-24-1951 Referring Provider:   Flowsheet Row Cardiac Rehab from 12/19/2024 in [facility] Cardiac and Pulmonary Rehab  Referring Provider Dr. Ammon    Encounter Date: 12/24/2024  Check In:  Session Check In - 12/24/24 0939       Check-In   Supervising physician immediately available to respond to emergencies See telemetry face sheet for immediately available ER MD    Location [facility]-Cardiac & Pulmonary Rehab    Staff Present Burnard Davenport RN,BSN,MPA;Maxon Burnell BS, Exercise Physiologist;[facility] BS, ACSM CEP, Exercise Physiologist    Virtual Visit No    Medication changes reported     No    Fall or balance concerns reported    No    Warm-up and Cool-down Performed on first and last piece of equipment    Resistance Training Performed Yes    VAD Patient? No    PAD/SET Patient? No      Pain Assessment   Currently in Pain? No/denies             Tobacco Use History[1]  Goals Met:  Independence with exercise equipment Exercise tolerated well No report of concerns or symptoms today Strength training completed today  Goals Unmet:  Not Applicable  Comments: Pt able to follow exercise prescription today without complaint.  Will continue to monitor for progression.    Dr. Oneil Pinal is Medical Director for [facility] Cardiac Rehabilitation.  Dr. Fuad Aleskerov is Medical Director for [facility] Pulmonary Rehabilitation.    [1]  Social History Tobacco Use  Smoking Status Never  Smokeless Tobacco Never

## 2024-12-26 ENCOUNTER — Encounter

## 2024-12-26 ENCOUNTER — Encounter: Payer: Self-pay | Admitting: *Deleted

## 2024-12-26 ENCOUNTER — Encounter: Admitting: Emergency Medicine

## 2024-12-26 DIAGNOSIS — Z955 Presence of coronary angioplasty implant and graft: Secondary | ICD-10-CM

## 2024-12-26 NOTE — Progress Notes (Signed)
 Assessment start time: 10:15 AM  Digestive issues/concerns: no known food allergies   24-hours Recall: B: cereal OR oatmeal and fruit L: yogurt or cottage cheese OR snack D: fish OR chicken with veggies, potato, pasta  Education r/t nutrition plan Treesa reports she eats 3 meals per day most days. She reads labels and tries to limit cholesterol and sugar. She has also started watching for sodium. She does not know what targets she should be aim to limit. Educated on what these nutrients do as well as included saturated fat. Provided guideline limits of less than 12g saturated fat, less than 1500mg  sodium and less than 250mg  cholesterol. Reviewed mediterranean diet handout. Educated on types of fats, sources, and how to read labels.Brainstormed several meals and snacks with foods she likes and will eat.   Goal 1: Read labels and reduce sodium intake to below 2300mg . Ideally 1500mg  per day.  Goal 2: Reduce saturated fat, less than 12g per day. Replace bad fats for more heart healthy fats.  Goal 3: Eat 15-30gProtein and 30-60gCarbs at each meal.  End time 11:00 AM

## 2024-12-26 NOTE — Progress Notes (Signed)
 Cardiac Individual Treatment Plan  Patient Details  Name: Morgan Dalton MRN: [id number] Date of Birth: Jul 19, 1951 Referring Provider:   Flowsheet Row Cardiac Rehab from 12/19/2024 in [facility] Cardiac and Pulmonary Rehab  Referring Provider Dr. Ammon    Initial Encounter Date:  Flowsheet Row Cardiac Rehab from 12/19/2024 in [facility] Cardiac and Pulmonary Rehab  Date 12/19/24    Visit Diagnosis: Status post coronary artery stent placement  Patient's Home Medications on Admission: Current Medications[1]  Past Medical History: Past Medical History:  Diagnosis Date   Ankle fracture, left    Anxiety    Cancer (HCC)    Uterine Cancer   Dyspepsia    Endometrial cancer (HCC)    GERD (gastroesophageal reflux disease)    History of kidney stones    Hx of dysplastic nevus 10/16/2010   Right lower leg. Severe atypia. Excised: 11/25/2010. 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 Well differentiated. Tx: EDC    Tobacco Use: Tobacco Use History[2]  Labs: Review Flowsheet        No data to display           Exercise Target Goals: Exercise Program Goal: Individual exercise prescription set using results from initial 6 min walk test and THRR while considering  patients activity barriers and safety.   Exercise Prescription Goal: Initial exercise prescription builds to 30-45 minutes a day of aerobic activity, 2-3 days per week.  Home exercise guidelines will be given to patient during program as part of exercise prescription that the participant will acknowledge.   Education: Aerobic Exercise: - Group verbal and visual presentation on the components of exercise prescription. Introduces F.I.T.T principle from ACSM for exercise prescriptions.  Reviews F.I.T.T. principles of aerobic exercise including progression. Written material provided at  class time.   Education: Resistance Exercise: - Group verbal and visual presentation on the components of exercise prescription. Introduces F.I.T.T principle from ACSM for exercise prescriptions  Reviews F.I.T.T. principles of resistance exercise including progression. Written material provided at class time.    Education: Exercise & Equipment Safety: - Individual verbal instruction and demonstration of equipment use and safety with use of the equipment. Flowsheet Row Cardiac Rehab from 12/19/2024 in [facility] Cardiac and Pulmonary Rehab  Date 12/19/24  Educator [facility]  Instruction Review Code 1- Verbalizes Understanding    Education: Exercise Physiology & General Exercise Guidelines: - Group verbal and written instruction with models to review the exercise physiology of the cardiovascular system and associated critical values. Provides general exercise guidelines with specific guidelines to those with heart or lung disease. Written material provided at class time.   Education: Flexibility, Balance, Mind/Body Relaxation: - Group verbal and visual presentation with interactive activity on the components of exercise prescription. Introduces F.I.T.T principle from ACSM for exercise prescriptions. Reviews F.I.T.T. principles of flexibility and balance exercise training including progression. Also discusses the mind body connection.  Reviews various relaxation techniques to help reduce and manage stress (i.e. Deep breathing, progressive muscle relaxation, and visualization). Balance handout provided to take home. Written material provided at class time.   Activity Barriers & Risk Stratification:  Activity Barriers & Cardiac Risk Stratification - 12/19/24 1154       Activity Barriers & Cardiac Risk Stratification   Activity Barriers None    Cardiac Risk Stratification Moderate  6 Minute Walk:  6 Minute Walk     Row Name 12/19/24 1153         6 Minute Walk   Phase Initial      Distance 1300 feet     Walk Time 6 minutes     # of Rest Breaks 0     MPH 2.46     METS 2.35     RPE 10     Perceived Dyspnea  1     VO2 Peak 8.22     Symptoms No     Resting HR 73 bpm     Resting BP 136/72     Resting Oxygen Saturation  97 %     Exercise Oxygen Saturation  during 6 min walk 98 %     Max Ex. HR 107 bpm     Max Ex. BP 140/70     2 Minute Post BP 126/60        Oxygen Initial Assessment:   Oxygen Re-Evaluation:   Oxygen Discharge (Final Oxygen Re-Evaluation):   Initial Exercise Prescription:  Initial Exercise Prescription - 12/19/24 1100       Date of Initial Exercise RX and Referring Provider   Date 12/19/24    Referring Provider Dr. Ammon      Oxygen   Maintain Oxygen Saturation 88% or higher      Treadmill   MPH 2    Grade 0.5    Minutes 15    METs 2.67      Recumbant Bike   Level 2    RPM 50    Watts 20    Minutes 15    METs 2.35      NuStep   Level 2    SPM 80    Minutes 15    METs 2.35      REL-XR   Level 2    Speed 50    Minutes 15    METs 2.35      T5 Nustep   Level 2    SPM 80    Minutes 15    METs 2.35      Track   Laps 30    Minutes 15    METs 2.63      Prescription Details   Frequency (times per week) 3    Duration Progress to 30 minutes of continuous aerobic without signs/symptoms of physical distress      Intensity   THRR 40-80% of Max Heartrate 101-130    Ratings of Perceived Exertion 11-13    Perceived Dyspnea 0-4      Progression   Progression Continue to progress workloads to maintain intensity without signs/symptoms of physical distress.      Resistance Training   Training Prescription Yes    Weight 3    Reps 10-15          Perform Capillary Blood Glucose checks as needed.  Exercise Prescription Changes:   Exercise Prescription Changes     Row Name 12/19/24 1100             Response to Exercise   Blood Pressure (Admit) 136/72       Blood Pressure (Exercise) 140/70        Blood Pressure (Exit) 126/60       Heart Rate (Admit) 73 bpm       Heart Rate (Exercise) 107 bpm       Heart Rate (Exit) 76 bpm       Oxygen Saturation (  Admit) 97 %       Oxygen Saturation (Exercise) 98 %       Oxygen Saturation (Exit) 98 %       Rating of Perceived Exertion (Exercise) 10       Perceived Dyspnea (Exercise) 1       Symptoms none       Comments 6 MWT results          Exercise Comments:   Exercise Comments     Row Name 12/21/24 0909           Exercise Comments First full day of exercise!  Patient was oriented to gym and equipment including functions, settings, policies, and procedures.  Patient's individual exercise prescription and treatment plan were reviewed.  All starting workloads were established based on the results of the 6 minute walk test done at initial orientation visit.  The plan for exercise progression was also introduced and progression will be customized based on patient's performance and goals.          Exercise Goals and Review:   Exercise Goals     Row Name 12/19/24 1158             Exercise Goals   Increase Physical Activity Yes       Intervention Provide advice, education, support and counseling about physical activity/exercise needs.;Develop an individualized exercise prescription for aerobic and resistive training based on initial evaluation findings, risk stratification, comorbidities and participant's personal goals.       Expected Outcomes Short Term: Attend rehab on a regular basis to increase amount of physical activity.;Long Term: Add in home exercise to make exercise part of routine and to increase amount of physical activity.;Long Term: Exercising regularly at least 3-5 days a week.       Increase Strength and Stamina Yes       Intervention Provide advice, education, support and counseling about physical activity/exercise needs.;Develop an individualized exercise prescription for aerobic and resistive training based on initial  evaluation findings, risk stratification, comorbidities and participant's personal goals.       Expected Outcomes Short Term: Increase workloads from initial exercise prescription for resistance, speed, and METs.;Short Term: Perform resistance training exercises routinely during rehab and add in resistance training at home;Long Term: Improve cardiorespiratory fitness, muscular endurance and strength as measured by increased METs and functional capacity ( )       Able to understand and use rate of perceived exertion (RPE) scale Yes       Intervention Provide education and explanation on how to use RPE scale       Expected Outcomes Short Term: Able to use RPE daily in rehab to express subjective intensity level;Long Term:  Able to use RPE to guide intensity level when exercising independently       Able to understand and use Dyspnea scale Yes       Intervention Provide education and explanation on how to use Dyspnea scale       Expected Outcomes Short Term: Able to use Dyspnea scale daily in rehab to express subjective sense of shortness of breath during exertion;Long Term: Able to use Dyspnea scale to guide intensity level when exercising independently       Knowledge and understanding of Target Heart Rate Range (THRR) Yes       Intervention Provide education and explanation of THRR including how the numbers were predicted and where they are located for reference       Expected Outcomes Short Term: Able  to state/look up THRR;Long Term: Able to use THRR to govern intensity when exercising independently;Short Term: Able to use daily as guideline for intensity in rehab       Able to check pulse independently Yes       Intervention Provide education and demonstration on how to check pulse in carotid and radial arteries.;Review the importance of being able to check your own pulse for safety during independent exercise       Expected Outcomes Short Term: Able to explain why pulse checking is important  during independent exercise;Long Term: Able to check pulse independently and accurately       Understanding of Exercise Prescription Yes       Intervention Provide education, explanation, and written materials on patient's individual exercise prescription       Expected Outcomes Short Term: Able to explain program exercise prescription;Long Term: Able to explain home exercise prescription to exercise independently          Exercise Goals Re-Evaluation :  Exercise Goals Re-Evaluation     Row Name 12/21/24 0910             Exercise Goal Re-Evaluation   Exercise Goals Review Increase Physical Activity;Able to understand and use rate of perceived exertion (RPE) scale;Knowledge and understanding of Target Heart Rate Range (THRR);Understanding of Exercise Prescription;Increase Strength and Stamina;Able to understand and use Dyspnea scale;Able to check pulse independently       Comments Reviewed RPE and dyspnea scale, THR and program prescription with pt today.  Pt voiced understanding and was given a copy of goals to take home.       Expected Outcomes Short: Use RPE daily to regulate intensity.  Long: Follow program prescription in THR.          Discharge Exercise Prescription (Final Exercise Prescription Changes):  Exercise Prescription Changes - 12/19/24 1100       Response to Exercise   Blood Pressure (Admit) 136/72    Blood Pressure (Exercise) 140/70    Blood Pressure (Exit) 126/60    Heart Rate (Admit) 73 bpm    Heart Rate (Exercise) 107 bpm    Heart Rate (Exit) 76 bpm    Oxygen Saturation (Admit) 97 %    Oxygen Saturation (Exercise) 98 %    Oxygen Saturation (Exit) 98 %    Rating of Perceived Exertion (Exercise) 10    Perceived Dyspnea (Exercise) 1    Symptoms none    Comments 6 MWT results          Nutrition:  Target Goals: Understanding of nutrition guidelines, daily intake of sodium 1500mg , cholesterol 200mg , calories 30% from fat and 7% or less from saturated  fats, daily to have 5 or more servings of fruits and vegetables.  Education: Nutrition 1 -Group instruction provided by verbal, written material, interactive activities, discussions, models, and posters to present general guidelines for heart healthy nutrition including macronutrients, label reading, and promoting whole foods over processed counterparts. Education serves as pensions consultant of discussion of heart healthy eating for all. Written material provided at class time.    Education: Nutrition 2 -Group instruction provided by verbal, written material, interactive activities, discussions, models, and posters to present general guidelines for heart healthy nutrition including sodium, cholesterol, and saturated fat. Providing guidance of habit forming to improve blood pressure, cholesterol, and body weight. Written material provided at class time.     Biometrics:  Pre Biometrics - 12/19/24 1158       Pre Biometrics   Height 5'  4.5 (1.638 m)    Weight 203 lb 4.8 oz (92.2 kg)    Waist Circumference 34.5 inches    Hip Circumference 50 inches    Waist to Hip Ratio 0.69 %    BMI (Calculated) 34.37    Single Leg Stand 16.16 seconds           Nutrition Therapy Plan and Nutrition Goals:  Nutrition Therapy & Goals - 12/19/24 1200       Intervention Plan   Intervention Prescribe, educate and counsel regarding individualized specific dietary modifications aiming towards targeted core components such as weight, hypertension, lipid management, diabetes, heart failure and other comorbidities.    Expected Outcomes Short Term Goal: Understand basic principles of dietary content, such as calories, fat, sodium, cholesterol and nutrients.;Short Term Goal: A plan has been developed with personal nutrition goals set during dietitian appointment.;Long Term Goal: Adherence to prescribed nutrition plan.          Nutrition Assessments:  MEDIFICTS Score Key: >=70 Need to make dietary changes   40-70 Heart Healthy Diet <= 40 Therapeutic Level Cholesterol Diet  Flowsheet Row Cardiac Rehab from 12/19/2024 in [facility] Cardiac and Pulmonary Rehab  Picture Your Plate Total Score on Admission 66   Picture Your Plate Scores: <59 Unhealthy dietary pattern with much room for improvement. 41-50 Dietary pattern unlikely to meet recommendations for good health and room for improvement. 51-60 More healthful dietary pattern, with some room for improvement.  >60 Healthy dietary pattern, although there may be some specific behaviors that could be improved.    Nutrition Goals Re-Evaluation:   Nutrition Goals Discharge (Final Nutrition Goals Re-Evaluation):   Psychosocial: Target Goals: Acknowledge presence or absence of significant depression and/or stress, maximize coping skills, provide positive support system. Participant is able to verbalize types and ability to use techniques and skills needed for reducing stress and depression.   Education: Stress, Anxiety, and Depression - Group verbal and visual presentation to define topics covered.  Reviews how body is impacted by stress, anxiety, and depression.  Also discusses healthy ways to reduce stress and to treat/manage anxiety and depression. Written material provided at class time.   Education: Sleep Hygiene -Provides group verbal and written instruction about how sleep can affect your health.  Define sleep hygiene, discuss sleep cycles and impact of sleep habits. Review good sleep hygiene tips.   Initial Review & Psychosocial Screening:  Initial Psych Review & Screening - 12/14/24 1437       Initial Review   Current issues with History of Depression;Current Psychotropic Meds      Family Dynamics   Good Support System? Yes      Barriers   Psychosocial barriers to participate in program There are no identifiable barriers or psychosocial needs.      Screening Interventions   Interventions Encouraged to exercise;Provide feedback  about the scores to participant;To provide support and resources with identified psychosocial needs    Expected Outcomes Short Term goal: Utilizing psychosocial counselor, staff and physician to assist with identification of specific Stressors or current issues interfering with healing process. Setting desired goal for each stressor or current issue identified.;Long Term Goal: Stressors or current issues are controlled or eliminated.;Short Term goal: Identification and review with participant of any Quality of Life or Depression concerns found by scoring the questionnaire.;Long Term goal: The participant improves quality of Life and PHQ9 Scores as seen by post scores and/or verbalization of changes          Quality of Life  Scores:   Quality of Life - 12/19/24 1159       Quality of Life   Select Quality of Life      Quality of Life Scores   Health/Function Pre 22.03 %    Socioeconomic Pre 23.75 %    Psych/Spiritual Pre 26.86 %    Family Pre 29.5 %    GLOBAL Pre 24.46 %         Scores of 19 and below usually indicate a poorer quality of life in these areas.  A difference of  2-3 points is a clinically meaningful difference.  A difference of 2-3 points in the total score of the Quality of Life Index has been associated with significant improvement in overall quality of life, self-image, physical symptoms, and general health in studies assessing change in quality of life.  PHQ-9: Review Flowsheet       12/19/2024 11/21/2024 11/01/2024 03/18/2023  Depression screen PHQ 2/9  Decreased Interest 0 0 0 0  Down, Depressed, Hopeless 0 0 0 0  PHQ - 2 Score 0 0 0 0  Altered sleeping 1 - - -  Tired, decreased energy 0 - - -  Change in appetite 0 - - -  Feeling bad or failure about yourself  0 - - -  Trouble concentrating 0 - - -  Moving slowly or fidgety/restless 0 - - -  Suicidal thoughts 0 - - -  PHQ-9 Score 1 - - -  Difficult doing work/chores Not difficult at all - - -   Interpretation  of Total Score  Total Score Depression Severity:  1-4 = Minimal depression, 5-9 = Mild depression, 10-14 = Moderate depression, 15-19 = Moderately severe depression, 20-27 = Severe depression   Psychosocial Evaluation and Intervention:  Psychosocial Evaluation - 12/14/24 1451       Psychosocial Evaluation & Interventions   Interventions Encouraged to exercise with the program and follow exercise prescription    Comments Morgan Dalton is coming to cardiac rehab after a stent placement. She states she can notice an improvement in her shortness of breath and is looking forward to getting back to walking. She has been on Zoloft  ever since her cancer diagnosis that was around the same time when she lost multiple family members. She is unsure is she still needs that dose, but states she feels mentally stable so she plans on staying on it. She enjoys reading and gardening and is looking forward to continuing to do both.    Expected Outcomes Short: attend cardiac rehab for education and exercise Long: develo and maintain positive self care habits    Continue Psychosocial Services  Follow up required by staff          Psychosocial Re-Evaluation:   Psychosocial Discharge (Final Psychosocial Re-Evaluation):   Vocational Rehabilitation: Provide vocational rehab assistance to qualifying candidates.   Vocational Rehab Evaluation & Intervention:  Vocational Rehab - 12/14/24 1436       Initial Vocational Rehab Evaluation & Intervention   Assessment shows need for Vocational Rehabilitation No          Education: Education Goals: Education classes will be provided on a variety of topics geared toward better understanding of heart health and risk factor modification. Participant will state understanding/return demonstration of topics presented as noted by education test scores.  Learning Barriers/Preferences:  Learning Barriers/Preferences - 12/14/24 1436       Learning Barriers/Preferences    Learning Barriers None    Learning Preferences None  General Cardiac Education Topics:  AED/CPR: - Group verbal and written instruction with the use of models to demonstrate the basic use of the AED with the basic ABC's of resuscitation.   Test and Procedures: - Group verbal and visual presentation and models provide information about basic cardiac anatomy and function. Reviews the testing methods done to diagnose heart disease and the outcomes of the test results. Describes the treatment choices: Medical Management, Angioplasty, or Coronary Bypass Surgery for treating various heart conditions including Myocardial Infarction, Angina, Valve Disease, and Cardiac Arrhythmias. Written material provided at class time.   Medication Safety: - Group verbal and visual instruction to review commonly prescribed medications for heart and lung disease. Reviews the medication, class of the drug, and side effects. Includes the steps to properly store meds and maintain the prescription regimen. Written material provided at class time.   Intimacy: - Group verbal instruction through game format to discuss how heart and lung disease can affect sexual intimacy. Written material provided at class time.   Know Your Numbers and Heart Failure: - Group verbal and visual instruction to discuss disease risk factors for cardiac and pulmonary disease and treatment options.  Reviews associated critical values for Overweight/Obesity, Hypertension, Cholesterol, and Diabetes.  Discusses basics of heart failure: signs/symptoms and treatments.  Introduces Heart Failure Zone chart for action plan for heart failure. Written material provided at class time.   Infection Prevention: - Provides verbal and written material to individual with discussion of infection control including proper hand washing and proper equipment cleaning during exercise session. Flowsheet Row Cardiac Rehab from 12/19/2024 in [facility] Cardiac and  Pulmonary Rehab  Date 12/19/24  Educator [facility]  Instruction Review Code 1- Verbalizes Understanding    Falls Prevention: - Provides verbal and written material to individual with discussion of falls prevention and safety. Flowsheet Row Cardiac Rehab from 12/19/2024 in [facility] Cardiac and Pulmonary Rehab  Date 12/19/24  Educator [facility]  Instruction Review Code 1- Verbalizes Understanding    Other: -Provides group and verbal instruction on various topics (see comments)   Knowledge Questionnaire Score:  Knowledge Questionnaire Score - 12/19/24 1200       Knowledge Questionnaire Score   Pre Score 25/26          Core Components/Risk Factors/Patient Goals at Admission:  Personal Goals and Risk Factors at Admission - 12/19/24 1200       Core Components/Risk Factors/Patient Goals on Admission    Weight Management Yes;Weight Loss    Intervention Weight Management: Develop a combined nutrition and exercise program designed to reach desired caloric intake, while maintaining appropriate intake of nutrient and fiber, sodium and fats, and appropriate energy expenditure required for the weight goal.;Weight Management: Provide education and appropriate resources to help participant work on and attain dietary goals.    Admit Weight 203 lb 4.8 oz (92.2 kg)    Goal Weight: Short Term 200 lb (90.7 kg)    Goal Weight: Long Term 190 lb (86.2 kg)    Expected Outcomes Short Term: Continue to assess and modify interventions until short term weight is achieved;Long Term: Adherence to nutrition and physical activity/exercise program aimed toward attainment of established weight goal;Weight Loss: Understanding of general recommendations for a balanced deficit meal plan, which promotes 1-2 lb weight loss per week and includes a negative energy balance of [phone] kcal/d;Understanding recommendations for meals to include 15-35% energy as protein, 25-35% energy from fat, 35-60% energy from carbohydrates, less than 200mg   of dietary cholesterol, 20-35 gm of total  fiber daily;Understanding of distribution of calorie intake throughout the day with the consumption of 4-5 meals/snacks    Hypertension Yes    Intervention Provide education on lifestyle modifcations including regular physical activity/exercise, weight management, moderate sodium restriction and increased consumption of fresh fruit, vegetables, and low fat dairy, alcohol moderation, and smoking cessation.;Monitor prescription use compliance.    Expected Outcomes Short Term: Continued assessment and intervention until BP is < 140/6mm HG in hypertensive participants. < 130/29mm HG in hypertensive participants with diabetes, heart failure or chronic kidney disease.;Long Term: Maintenance of blood pressure at goal levels.    Lipids Yes    Intervention Provide education and support for participant on nutrition & aerobic/resistive exercise along with prescribed medications to achieve LDL 70mg , HDL >40mg .    Expected Outcomes Short Term: Participant states understanding of desired cholesterol values and is compliant with medications prescribed. Participant is following exercise prescription and nutrition guidelines.;Long Term: Cholesterol controlled with medications as prescribed, with individualized exercise RX and with personalized nutrition plan. Value goals: LDL < 70mg , HDL > 40 mg.          Education:Diabetes - Individual verbal and written instruction to review signs/symptoms of diabetes, desired ranges of glucose level fasting, after meals and with exercise. Acknowledge that pre and post exercise glucose checks will be done for 3 sessions at entry of program.   Core Components/Risk Factors/Patient Goals Review:    Core Components/Risk Factors/Patient Goals at Discharge (Final Review):    ITP Comments:  ITP Comments     Row Name 12/14/24 1516 12/19/24 1153 12/21/24 0909 12/26/24 1151     ITP Comments Initial phone call completed. Diagnosis can be  found in [facility] 12/1. EP Orientation scheduled for Wednesday 1/7 at 10:30 am. Completed and gym orientation for cardiac rehab. Initial ITP created and sent for review to Dr. Oneil Pinal, Medical Director. First full day of exercise!  Patient was oriented to gym and equipment including functions, settings, policies, and procedures.  Patient's individual exercise prescription and treatment plan were reviewed.  All starting workloads were established based on the results of the 6 minute walk test done at initial orientation visit.  The plan for exercise progression was also introduced and progression will be customized based on patient's performance and goals. 30 Day review completed. Medical Director ITP review done, changes made as directed, and signed approval by Medical Director. New to program.       Comments: 30 Day Review     [1]  Current Outpatient Medications:    albuterol  (VENTOLIN  HFA) 108 (90 Base) MCG/ACT inhaler, Inhale 2 puffs into the lungs every 6 (six) hours as needed for wheezing. (Patient not taking: Reported on 11/22/2024), Disp: , Rfl:    ascorbic acid (VITAMIN C) 500 MG tablet, Take 500 mg by mouth daily. Patient states she takes it October - April., Disp: , Rfl:    aspirin  EC 81 MG tablet, Take 81 mg by mouth daily. , Disp: , Rfl:    calcium  carbonate (OS-CAL - DOSED IN MG OF ELEMENTAL CALCIUM ) 1250 (500 Ca) MG tablet, Take by mouth., Disp: , Rfl:    cholecalciferol (VITAMIN D3) 25 MCG (1000 UNIT) tablet, Take 1,000 Units by mouth daily., Disp: , Rfl:    Coenzyme Q10 (CO Q-10) 100 MG CAPS, Take 100 mg by mouth daily., Disp: , Rfl:    Cranberry 500 MG TABS, Take by mouth., Disp: , Rfl:    ezetimibe  (ZETIA ) 10 MG tablet, Take 10 mg by mouth  daily., Disp: , Rfl:    Glucosamine-Chondroitin 750-600 MG TABS, Take 1 tablet by mouth daily., Disp: , Rfl:    hydrochlorothiazide  (HYDRODIURIL ) 25 MG tablet, Take 12.5 mg by mouth daily., Disp: , Rfl:    Krill Oil 1000 MG CAPS, Take by  mouth., Disp: , Rfl:    letrozole  (FEMARA ) 2.5 MG tablet, TAKE 1 TABLET(2.5 MG) BY MOUTH DAILY, Disp: 90 tablet, Rfl: 3   levothyroxine  (SYNTHROID , LEVOTHROID) 25 MCG tablet, Take 25 mcg by mouth daily before breakfast., Disp: , Rfl:    losartan  (COZAAR ) 50 MG tablet, Take 1 tablet by mouth daily., Disp: , Rfl:    Multiple Vitamins-Minerals (WOMENS 50+ MULTI VITAMIN PO), Take by mouth., Disp: , Rfl:    pantoprazole  (PROTONIX ) 40 MG tablet, Take 40 mg by mouth every other day., Disp: , Rfl:    prasugrel  (EFFIENT ) 10 MG TABS tablet, Take 1 tablet (10 mg total) by mouth daily., Disp: 30 tablet, Rfl: 0   rosuvastatin  (CRESTOR ) 20 MG tablet, Take 1 tablet (20 mg total) by mouth every evening., Disp: 30 tablet, Rfl: 0   sertraline  (ZOLOFT ) 50 MG tablet, Take 50 mg by mouth daily., Disp: , Rfl:  [2]  Social History Tobacco Use  Smoking Status Never  Smokeless Tobacco Never

## 2024-12-26 NOTE — Progress Notes (Signed)
 Daily Session Note  Patient Details  Name: Morgan Dalton MRN: [id number] Date of Birth: 12/09/1951 Referring Provider:   Flowsheet Row Cardiac Rehab from 12/19/2024 in [facility] Cardiac and Pulmonary Rehab  Referring Provider Dr. Ammon    Encounter Date: 12/26/2024  Check In:  Session Check In - 12/26/24 0900       Check-In   Supervising physician immediately available to respond to emergencies See telemetry face sheet for immediately available ER MD    Location [facility]-Cardiac & Pulmonary Rehab    Staff Present Leita Franks RN,BSN;Joseph [facility] BS, Exercise Physiologist;Margaret Best, MS, Exercise Physiologist    Virtual Visit No    Medication changes reported     No    Fall or balance concerns reported    No    Warm-up and Cool-down Performed on first and last piece of equipment    VAD Patient? No    PAD/SET Patient? No      Pain Assessment   Currently in Pain? No/denies             Tobacco Use History[1]  Goals Met:  Independence with exercise equipment Exercise tolerated well No report of concerns or symptoms today Strength training completed today  Goals Unmet:  Not Applicable  Comments: Pt able to follow exercise prescription today without complaint.  Will continue to monitor for progression.    Dr. Oneil Pinal is Medical Director for [facility] Cardiac Rehabilitation.  Dr. Fuad Aleskerov is Medical Director for [facility] Pulmonary Rehabilitation.    [1]  Social History Tobacco Use  Smoking Status Never  Smokeless Tobacco Never

## 2024-12-28 ENCOUNTER — Encounter

## 2024-12-28 DIAGNOSIS — Z955 Presence of coronary angioplasty implant and graft: Secondary | ICD-10-CM

## 2024-12-28 NOTE — Progress Notes (Signed)
 Daily Session Note  Patient Details  Name: HUE STEVESON MRN: [id number] Date of Birth: June 02, 1951 Referring Provider:   Flowsheet Row Cardiac Rehab from 12/19/2024 in [facility] Cardiac and Pulmonary Rehab  Referring Provider Dr. Ammon    Encounter Date: 12/28/2024  Check In:  Session Check In - 12/28/24 0909       Check-In   Supervising physician immediately available to respond to emergencies See telemetry face sheet for immediately available ER MD    Location [facility]-Cardiac & Pulmonary Rehab    Staff Present Burnard Davenport RN,BSN,MPA;Maxon Conetta BS, Exercise Physiologist;Joseph Hood RCP,RRT,BSRT;Noah Tickle, [place], Exercise Physiologist    Virtual Visit No    Medication changes reported     No    Fall or balance concerns reported    No    Warm-up and Cool-down Performed on first and last piece of equipment    Resistance Training Performed Yes    VAD Patient? No    PAD/SET Patient? No      Pain Assessment   Currently in Pain? No/denies             Tobacco Use History[1]  Goals Met:  Independence with exercise equipment Exercise tolerated well No report of concerns or symptoms today Strength training completed today  Goals Unmet:  Not Applicable  Comments: Pt able to follow exercise prescription today without complaint.  Will continue to monitor for progression.    Dr. Oneil Pinal is Medical Director for [facility] Cardiac Rehabilitation.  Dr. Fuad Aleskerov is Medical Director for [facility] Pulmonary Rehabilitation.    [1]  Social History Tobacco Use  Smoking Status Never  Smokeless Tobacco Never

## 2024-12-31 ENCOUNTER — Encounter

## 2024-12-31 DIAGNOSIS — Z955 Presence of coronary angioplasty implant and graft: Secondary | ICD-10-CM

## 2024-12-31 NOTE — Progress Notes (Signed)
 Daily Session Note  Patient Details  Name: Morgan Dalton MRN: [id number] Date of Birth: Apr 18, 1951 Referring Provider:   Flowsheet Row Cardiac Rehab from 12/19/2024 in [facility] Cardiac and Pulmonary Rehab  Referring Provider Dr. Ammon    Encounter Date: 12/31/2024  Check In:  Session Check In - 12/31/24 0926       Check-In   Supervising physician immediately available to respond to emergencies See telemetry face sheet for immediately available ER MD    Location [facility]-Cardiac & Pulmonary Rehab    Staff Present Burnard Davenport RN,BSN,MPA;Joseph Hood RCP,RRT,BSRT;Maxon Burnell BS, Exercise Physiologist;Taquana Bartley Dyane HECKLE, ACSM CEP, Exercise Physiologist    Virtual Visit No    Medication changes reported     No    Fall or balance concerns reported    No    Warm-up and Cool-down Performed on first and last piece of equipment    Resistance Training Performed Yes    VAD Patient? No    PAD/SET Patient? No      Pain Assessment   Currently in Pain? No/denies             Tobacco Use History[1]  Goals Met:  Independence with exercise equipment Exercise tolerated well No report of concerns or symptoms today Strength training completed today  Goals Unmet:  Not Applicable  Comments: Pt able to follow exercise prescription today without complaint.  Will continue to monitor for progression.    Dr. Oneil Pinal is Medical Director for [facility] Cardiac Rehabilitation.  Dr. Fuad Aleskerov is Medical Director for [facility] Pulmonary Rehabilitation.    [1]  Social History Tobacco Use  Smoking Status Never  Smokeless Tobacco Never

## 2025-01-02 ENCOUNTER — Encounter: Admitting: Emergency Medicine

## 2025-01-02 DIAGNOSIS — Z955 Presence of coronary angioplasty implant and graft: Secondary | ICD-10-CM

## 2025-01-02 NOTE — Progress Notes (Signed)
 Daily Session Note  Patient Details  Name: Morgan Dalton MRN: [id number] Date of Birth: 12/21/1950 Referring Provider:   Flowsheet Row Cardiac Rehab from 12/19/2024 in [facility] Cardiac and Pulmonary Rehab  Referring Provider Dr. Ammon    Encounter Date: 01/02/2025  Check In:  Session Check In - 01/02/25 0932       Check-In   Supervising physician immediately available to respond to emergencies See telemetry face sheet for immediately available ER MD    Location [facility]-Cardiac & Pulmonary Rehab    Staff Present Leita Franks RN,BSN;Joseph [facility] BS, Exercise Physiologist;Margaret Best, MS, Exercise Physiologist    Virtual Visit No    Medication changes reported     No    Fall or balance concerns reported    No    Warm-up and Cool-down Performed on first and last piece of equipment    Resistance Training Performed Yes    VAD Patient? No    PAD/SET Patient? No      Pain Assessment   Currently in Pain? No/denies             Tobacco Use History[1]  Goals Met:  Independence with exercise equipment Exercise tolerated well No report of concerns or symptoms today Strength training completed today  Goals Unmet:  Not Applicable  Comments: Pt able to follow exercise prescription today without complaint.  Will continue to monitor for progression.    Dr. Oneil Pinal is Medical Director for [facility] Cardiac Rehabilitation.  Dr. Fuad Aleskerov is Medical Director for [facility] Pulmonary Rehabilitation.    [1]  Social History Tobacco Use  Smoking Status Never  Smokeless Tobacco Never

## 2025-01-04 ENCOUNTER — Encounter

## 2025-01-04 DIAGNOSIS — Z955 Presence of coronary angioplasty implant and graft: Secondary | ICD-10-CM

## 2025-01-04 NOTE — Progress Notes (Signed)
 Daily Session Note  Patient Details  Name: Morgan Dalton MRN: [id number] Date of Birth: Jul 21, 1951 Referring Provider:   Flowsheet Row Cardiac Rehab from 12/19/2024 in [facility] Cardiac and Pulmonary Rehab  Referring Provider Dr. Ammon    Encounter Date: 01/04/2025  Check In:  Session Check In - 01/04/25 0904       Check-In   Supervising physician immediately available to respond to emergencies See telemetry face sheet for immediately available ER MD    Location [facility]-Cardiac & Pulmonary Rehab    Staff Present Burnard Davenport RN,BSN,MPA;Maxon Conetta BS, Exercise Physiologist;Joseph Rolinda NORWOOD HARMAN Verlie Laird, [place], Exercise Physiologist    Virtual Visit No    Medication changes reported     No    Fall or balance concerns reported    No    Warm-up and Cool-down Performed on first and last piece of equipment    Resistance Training Performed Yes    VAD Patient? No    PAD/SET Patient? No      Pain Assessment   Currently in Pain? No/denies             Tobacco Use History[1]  Goals Met:  Independence with exercise equipment Exercise tolerated well No report of concerns or symptoms today Strength training completed today  Goals Unmet:  Not Applicable  Comments: Pt able to follow exercise prescription today without complaint.  Will continue to monitor for progression.    Dr. Oneil Pinal is Medical Director for [facility] Cardiac Rehabilitation.  Dr. Fuad Aleskerov is Medical Director for [facility] Pulmonary Rehabilitation.    [1]  Social History Tobacco Use  Smoking Status Never  Smokeless Tobacco Never

## 2025-01-07 ENCOUNTER — Encounter

## 2025-01-08 ENCOUNTER — Encounter: Payer: Self-pay | Admitting: Nurse Practitioner

## 2025-01-09 ENCOUNTER — Encounter

## 2025-01-09 ENCOUNTER — Telehealth: Payer: Self-pay | Admitting: Nurse Practitioner

## 2025-01-09 DIAGNOSIS — Z955 Presence of coronary angioplasty implant and graft: Secondary | ICD-10-CM

## 2025-01-09 NOTE — Telephone Encounter (Signed)
 Contacted patient to follow up on mychart concerns for vaginal bleeding. Suspect atrophy & antiplatelet is causing bleeding/spotting. Will offer her an appointment to evaluate vs monitoring. Left voicemail.

## 2025-01-09 NOTE — Progress Notes (Signed)
 Daily Session Note  Patient Details  Name: Morgan Dalton MRN: [id number] Date of Birth: [birth year] Referring Provider:   Flowsheet Row Cardiac Rehab from 12/19/2024 in [facility] Cardiac and Pulmonary Rehab  Referring Provider Dr. Ammon    Encounter Date: 01/09/2025  Check In:  Session Check In - 01/09/25 0944       Check-In   Supervising physician immediately available to respond to emergencies See telemetry face sheet for immediately available ER MD    Location [facility]-Cardiac & Pulmonary Rehab    Staff Present Burnard Davenport RN,BSN,MPA;Maxon Conetta BS, Exercise Physiologist;Margaret Best, MS, Exercise Physiologist;Noah Tickle, BS, Exercise Physiologist    Virtual Visit No    Medication changes reported     No    Fall or balance concerns reported    No    Warm-up and Cool-down Performed on first and last piece of equipment    Resistance Training Performed Yes    VAD Patient? No    PAD/SET Patient? No      Pain Assessment   Currently in Pain? No/denies             Tobacco Use History[1]  Goals Met:  Independence with exercise equipment Exercise tolerated well No report of concerns or symptoms today Strength training completed today  Goals Unmet:  Not Applicable  Comments: Pt able to follow exercise prescription today without complaint.  Will continue to monitor for progression.    Dr. Oneil Pinal is Medical Director for [facility] Cardiac Rehabilitation.  Dr. Fuad Aleskerov is Medical Director for [facility] Pulmonary Rehabilitation.    [1]  Social History Tobacco Use  Smoking Status Never  Smokeless Tobacco Never

## 2025-01-10 ENCOUNTER — Telehealth: Payer: Self-pay | Admitting: Obstetrics and Gynecology

## 2025-01-10 ENCOUNTER — Encounter: Payer: Self-pay | Admitting: Radiation Oncology

## 2025-01-10 ENCOUNTER — Telehealth: Payer: Self-pay

## 2025-01-10 NOTE — Telephone Encounter (Signed)
 Voicemail received today 01/10/25 at 3:56pm from patient returning Lauren Allen's call; mentioned she did not know if you received her message yesterday.  Best call back number [phone].

## 2025-01-10 NOTE — Telephone Encounter (Signed)
 Pt called to r/s CT - pt confirmed new date/time - LH

## 2025-01-11 ENCOUNTER — Telehealth: Payer: Self-pay | Admitting: Nurse Practitioner

## 2025-01-11 ENCOUNTER — Encounter

## 2025-01-11 DIAGNOSIS — N950 Postmenopausal bleeding: Secondary | ICD-10-CM

## 2025-01-11 DIAGNOSIS — Z955 Presence of coronary angioplasty implant and graft: Secondary | ICD-10-CM

## 2025-01-11 NOTE — Progress Notes (Signed)
 Daily Session Note  Patient Details  Name: Morgan Dalton MRN: [id number] Date of Birth: 1951/06/11 Referring Provider:   Flowsheet Row Cardiac Rehab from 12/19/2024 in [facility] Cardiac and Pulmonary Rehab  Referring Provider Dr. Ammon    Encounter Date: 01/11/2025  Check In:  Session Check In - 01/11/25 0940       Check-In   Supervising physician immediately available to respond to emergencies See telemetry face sheet for immediately available ER MD    Location [facility]-Cardiac & Pulmonary Rehab    Staff Present Burnard Davenport RN,BSN,MPA;Laura Cates RN,BSN;Joseph Rolinda NORWOOD HARMAN Verlie Laird, [place], Exercise Physiologist    Virtual Visit No    Medication changes reported     No    Fall or balance concerns reported    No    Warm-up and Cool-down Performed on first and last piece of equipment    Resistance Training Performed Yes    VAD Patient? No    PAD/SET Patient? No      Pain Assessment   Currently in Pain? No/denies             Tobacco Use History[1]  Goals Met:  Independence with exercise equipment Exercise tolerated well No report of concerns or symptoms today Strength training completed today  Goals Unmet:  Not Applicable  Comments: Pt able to follow exercise prescription today without complaint.  Will continue to monitor for progression.    Dr. Oneil Pinal is Medical Director for [facility] Cardiac Rehabilitation.  Dr. Fuad Aleskerov is Medical Director for [facility] Pulmonary Rehabilitation.    [1]  Social History Tobacco Use  Smoking Status Never  Smokeless Tobacco Never

## 2025-01-11 NOTE — Telephone Encounter (Signed)
 Spoke to patient. She has had more than a week of vaginal bleeding, some small clots. Bleeding has slowed, nearly stopped, and now started again. She has recently started plavix and is also having nosebleeds. 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 Last imaging was November and no evidence of disease in the pelvis. Recommend pelvic exam to evaluate further. Schedule message sent.

## 2025-01-14 ENCOUNTER — Encounter

## 2025-01-16 ENCOUNTER — Encounter: Admitting: Emergency Medicine

## 2025-01-16 ENCOUNTER — Encounter: Payer: Self-pay | Admitting: Obstetrics and Gynecology

## 2025-01-16 ENCOUNTER — Inpatient Hospital Stay: Attending: Obstetrics and Gynecology | Admitting: Obstetrics and Gynecology

## 2025-01-16 VITALS — BP 127/66 | HR 76 | Temp 98.6°F | Resp 20 | Wt 203.8 lb

## 2025-01-16 DIAGNOSIS — N939 Abnormal uterine and vaginal bleeding, unspecified: Secondary | ICD-10-CM

## 2025-01-16 DIAGNOSIS — C549 Malignant neoplasm of corpus uteri, unspecified: Secondary | ICD-10-CM

## 2025-01-16 DIAGNOSIS — Z955 Presence of coronary angioplasty implant and graft: Secondary | ICD-10-CM

## 2025-01-16 NOTE — Progress Notes (Signed)
 Gynecologic Oncology Interval Visit   Referring Provider: [name]  Chief Complaint: Right pelvic recurrence of grade 1 endometrial cancer Subjective:  Morgan Dalton is a 74 y.o. female who is seen in consultation from [name] with history of endometrial cancer in 2009, with incidental right pelvic mass in 01/2023, consistent with recurrent disease, s/p radiation, now on letrozole  since August 2024, who returns to clinic for follow up and discussion of imaging results.   She continues with Letrozole  and has no complaints.  Coronary stent placed about 6 weeks ago and started on baby ASA and Pasugrel.  Started having vaginal bleeding.  She was switched to just Plavix and bleeding improving.    11/04/2024 CT C/A/P  Ascending thoracic aorta measures 37 mm when measured in coronal projection (image 52 series 4). Heart and pericardium are unremarkable. The central airways are clear. No mediastinal lymphadenopathy. Rounded left axillary lymph node measures 8 mm (image 21 of series 2) compared to 8 mm on CT 04/30/2024. No hilar lymphadenopathy.  IMPRESSION: 1. No change in size of left axillary lymph node. 2. No evidence of recurrent or progressive cervical carcinoma. 3. Normal ascending thoracic aortic diameter when remeasured.  Treatment Summary:  2009- vaginal hysterectomy with Dr Lovetta at [facility]. Pathology consistent with endometrial adenocarcinoma 02/02/2008- underwent RA BSO, bilateral pelvic LN dissection, supraumbilical laparotomy, periaortic nodes with [name] at [facility]. Stage Ic. Elected for adjuvant vaginal brachytherapy 01/21/23- ER for abdominal pain. Solid mass 3.2 x 2.2 04/07/2023-05/12/2023 - total dose with 50 Gy completed radiation. 07/20/2023- started letrozole   10/12/23- CT - response to therapy. New sclerosis of right sacral wing.  10/21/23- MRI Lumbar & Sacrum - vertical fracture of right sacral ala with edema and enhancement.  12/24 - diagnosed with  osteoporosis and started on Prolia.  See HPI for further details  Last colonoscopy - 12/16/23 Last mammogram 03/12/24  Gynecologic Oncology History:  She underwent vaginal hysterectomy with Dr Lovetta at [facility] in 2009. 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 She was referred to Dr. Cleotilde at [facility] and underwent robotic assisted bilateral salpino-oophorectomy, bilateral pelvic lymph node dissection, supraumbilical laparotomy and periaortic lymph node dissection. Surgery complicated by adhesions. Final pathology revealed a deeply invasive (two thirds), well differentiated endometrioid adenocarcinoma, stage Ic. Washings cervix and adnexa were negative. She elected for adjuvant vaginal brachytherapy at [facility] and completed treatment 02/02/2008.   Patient went to ER for RUQ abdominal pain. CT 01/21/23 showed diverticulitis at hepatic flexure with some free air. Treated with antibiotics for diverticulitis.  Incidental finding of solid mass of right posterior adnexa measuring 3.2 x 2.2 cm, new since previous CT scan in 2020. She also has a large staghorn calculus in the right kidney.  She was seen by [name] in [place].   Patient provided us  with her records from her endometrial cancer which were reviewed in detail, copied, and scanned into her chart.  Last colonoscopy 11/07/2018- report reviewed and showed tortuous colon, diverticulosis, recommendation to repeat in 5 years (2024)  01/30/23- MRI Abdomen and Pelvis w wo contrast S/p hysterectomy. Left ovary not visualized. Solid heterogeneously enhancing right ovarian lesion measuring 3.0 x 1.9 x 2.3 cm. No ascites. No lymphadenopathy. Inflammation of proximal transverse colon consistent with residual diverticulitis.  MRI was addended to reflect that mass was present on CT from 2020 and measured 1.6 x 1.2 cm at that time. No definite communication with the colon or small bowel loops and the appendix is identified as  separate  from this lesion.   HE4- 142 (elevated) CA 125 - 18 (normal) ROMA- 30.57% (high risk)  01/25/23- Pap- NILM  A PET scan was performed to better ascertain the etiology of the mass.  02/10/23- PET 1. Hypermetabolic solid mass in the RIGHT pelvis consistent with ovarian neoplasm. 2. No evidence of metastatic adenopathy in the pelvis or periaortic retroperitoneum. 3. No evidence of metastatic disease outside the pelvis. 4. Post hysterectomy anatomy. No abnormal metabolic activity associated with the vagina. 5. RIGHT renal staghorn calculus.  Imaging was reviewed by Dr. Malva. Report was addended to reflect that based on her prior surgical hysterectomy and oophorectomy, intensely hypermetabolic solid mass is favored to be a neoplastic deposit, potentially from remote endometrial carcinoma.   03/04/23 Image guided biopsy of right pelvic mass. DIAGNOSIS: A. SOFT TISSUE, RIGHT ADNEXAL; CT-GUIDED CORE NEEDLE BIOPSY: - POSITIVE FOR MALIGNANCY. - METASTATIC ADENOCARCINOMA, COMPATIBLE WITH ENDOMETRIOID ADENOCARCINOMA.  Comment: The patient's prior history of endometrioid adenocarcinoma in 2009 is noted.  Biopsy sections display an abnormal cribriform and somewhat papillary glandular proliferation, histologically compatible with the patient's previously diagnosed endometrioid adenocarcinoma.  Immunohistochemical studies show tumor cells to be positive for pancytokeratin, PAX8, estrogen receptor (greater than 90%, strong), and progesterone receptor (greater than 90%, strong).  Tumor cells are negative for CK7, WT1, CDX2, and Napsin A.  P53 demonstrates a wild-type (non-mutated) pattern of expression.  Taken together, the morphologic and immunophenotypic findings support the above diagnosis.  Tumor cells are positive for estrogen receptor (greater than 90%,  strong) and progesterone receptor (greater than 90%, strong). Tumor cells display wild-type (9 mutated) expression of p53.   DNA Mismatch  Repair (MMR)  MLH1: Intact nuclear expression  MSH2: Intact nuclear expression  MSH6: Intact nuclear expression  PMS2: Intact nuclear expression   04/07/2023-05/12/2023 total dose with 50 Gy completed radiation.   06/24/2023 CT A/P Findings: The lesion centered just superior and lateral to the right side of the vaginal cuff measures 2.0 x 1.6 cm on 72/2. It is centrally hypoattenuating today (presumably secondary to necrosis). This makes differentiation from surrounding bowel loops challenging. Decreased in size from 3.2 x 2.2 cm on the prior CT. IMPRESSION: 1. Response to therapy of right pelvic mass, as evidenced by decrease in size and development of central necrosis. 2. No new or progressive disease. 3. Similar right lower pole staghorn type renal calculus. 4. Hepatomegaly and possible hepatic steatosis. 5.  Aortic Atherosclerosis (ICD10-I70.0).  07/20/2023 She was counseled about Megace versus letrozole . She opted for letrozole .   We reviewed her last DEXA which was DEXA 08/09/2022 INTERPRETATION:  Osteopenia/Low bone mass. No significant change in bone mineral density from  08-08-2020.   She is followed by her primary care provider.  With regard to the osteopenia he recommended calcium  300 mg once daily and Vit D 1000U daily.    10/12/2023 CT A/P CT ABDOMEN AND PELVIS WITH CONTRAST IMPRESSION: 1. Interval decrease in size of right posterior pelvic sidewall soft tissue nodule. 2. New asymmetric sclerosis within the right sacral wing. This is somewhat ill-defined. Findings may be related to insufficiency fracture or less likely osseous metastasis. Consider further evaluation with contrast enhanced MRI of the pelvis. 3. Stable 3 cm staghorn calculus within the inferior pole of the right kidney. 4. Colonic diverticulosis without signs of acute diverticulitis. 5.  Aortic Atherosclerosis (ICD10-I70.0).  11/29/23 - DEXA diagnosed with osteoporosis and started on Prolia.   CT scan 01/19/2024   CT C/A/P IMPRESSION: 1. Similar size of the right posterior  pelvic soft tissue mass which is difficult to definitively assess given its small size and close approximation with the pelvic structures. 2. 3 mm pulmonary nodule in the right lung apex common nonspecific. Suggest attention on short-term interval follow-up chest CT. 3. No evidence of new or progressive disease in the abdomen or pelvis. 4. Left lower pole caliectasis with a 2.1 cm staghorn type calculus. 5. Sacral insufficiency fracture again seen. 6.  Aortic Atherosclerosis (ICD10-I70.0).  CT scan 04/30/2024  CT C/A/P IMPRESSION: 1. Status post hysterectomy. No well-defined residual pelvic mass. No new or progressive disease. 2. Similar right apical 3 mm ground-glass nodule, nonspecific but of doubtful clinical significance. 3. Incidental findings, including: Coronary artery atherosclerosis. Aortic Atherosclerosis (ICD10-I70.0). Right-sided lower pole staghorn type calculus causing similar mild caliectasis. 4. Possible bladder wall thickening. Correlate with symptoms of cystitis.  CT scan 08/08/2024  CT C/A/P IMPRESSION: 1. 7 mm short axis left axillary node identified on recent CT of 04/30/2024 is hypermetabolic with SUV max = 7.4. This lymph node was 7 mm short axis on the 01/19/2024 CT exam and 4-5 mm short axis on the PET-CT from 02/10/2023. While relatively stable in size since 01/19/2024, given persistence and associated hypermetabolism, metastatic disease a concern. 2. No other suspicious hypermetabolic disease on today's study. 3.  Aortic Atherosclerosis (ICD10-I70.0)  08/08/24- PET Restaging CHEST: No hypermetabolic mediastinal or hilar nodes. 7 mm short axis left axillary node identified on recent CT of 04/30/2024 is hypermetabolic with SUV max = 7.4. This lymph node with 7 mm short axis on the 01/19/2024 exam and 4-5 mm short axis on the PET-CT from 02/10/2023. No suspicious pulmonary nodules on the CT scan.    Incidental CT findings: Ascending thoracic aorta measures up to 4.3 cm diameter. Mild atherosclerotic calcification is noted in the wall of the thoracic aorta. Calcified granuloma noted left lower lobe. Centrilobular emphsyema noted. Tiny right apical ground-glass nodule seen on previous CT of 04/30/2024 not readily evident by PET-CT today.    Incidental CT findings: Bilateral sacral insufficiency fractures stable since 04/30/2024.   IMPRESSION: 1. 7 mm short axis left axillary node identified on recent CT of 04/30/2024 is hypermetabolic with SUV max = 7.4. This lymph node was 7 mm short axis on the 01/19/2024 CT exam and 4-5 mm short axis on the PET-CT from 02/10/2023. While relatively stable in size since 01/19/2024, given persistence and associated hypermetabolism, metastatic disease a concern. 2. No other suspicious hypermetabolic disease on today's study. 3.  Aortic Atherosclerosis (ICD10-I70.0).   ADDENDUM: As noted in the body of the report, ascending thoracic aorta measures up to 4.3 cm diameter. Recommend annual imaging followup by CTA or MRA. This recommendation follows 2010 ACCF/AHA/AATS/ACR/ASA/SCA/SCAI/SIR/STS/SVM Guidelines for the Diagnosis and Management of Patients with Thoracic Aortic Disease. Circulation. 2010; 121: [id number]. Aortic aneurysm NOS (ICD10-I71.9)    Problem List: Patient Active Problem List   Diagnosis Date Noted   Coronary artery disease of native artery of native heart with stable angina pectoris 12/19/2024   Unstable angina (HCC) 11/22/2024   Dilatation of thoracic aorta 10/29/2024   Aneurysm of ascending aorta without rupture 09/04/2024   Prediabetes 04/23/2024   Endometrial cancer (HCC) 03/20/2023   Benign essential hypertension 01/27/2023   Diverticulitis 01/26/2023   Thrombocytosis 11/29/2019   GERD (gastroesophageal reflux disease) 12/31/2016   History of nephrolithiasis 12/31/2016   Hyperlipidemia, unspecified 12/31/2016   Insomnia  12/31/2016   Osteopenia 12/31/2016   Acquired hypothyroidism 10/26/2016   Bladder neoplasm of uncertain malignant potential 09/09/2016  Bladder pain 08/11/2016   Chronic cystitis 08/11/2016   Mild episode of recurrent major depressive disorder 07/23/2016   History of endometrial cancer 01/23/2016   Recurrent carcinoma of endometrium (HCC) 01/23/2016   Renal colic 06/19/2014   Right upper quadrant pain 08/07/2012   Kidney stone 08/07/2012   Gross hematuria 08/07/2012   Malignant neoplasm of corpus uteri (HCC) 08/07/2012   Microscopic hematuria 08/07/2012   Urinary tract infection 08/07/2012    Past Medical History: Past Medical History:  Diagnosis Date   Ankle fracture, left    Anxiety    Cancer (HCC)    Uterine Cancer   Dyspepsia    Endometrial cancer (HCC)    GERD (gastroesophageal reflux disease)    History of kidney stones    Hx of dysplastic nevus 10/16/2010   Right lower leg. Severe atypia. Excised: 11/25/2010. 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 Well differentiated. Tx: [facility]    Past Surgical History: Past Surgical History:  Procedure Laterality Date   ABDOMINAL HYSTERECTOMY     CATARACT EXTRACTION W/PHACO Right 12/07/2016   Procedure: CATARACT EXTRACTION PHACO AND INTRAOCULAR LENS PLACEMENT (IOC);  Surgeon: Elsie Carmine, MD;  Location: [facility] ORS;  Service: Ophthalmology;  Laterality: Right;  US  42.5 AP% 23.2 CDE 9.87 Fluid pack lot # [id number]   CATARACT EXTRACTION W/PHACO Left 01/04/2017   Procedure: CATARACT EXTRACTION PHACO AND INTRAOCULAR LENS PLACEMENT (IOC);  Surgeon: Elsie Carmine, MD;  Location: [facility] ORS;  Service: Ophthalmology;  Laterality: Left;  US  39.2 AP% 23.9 CDE 9.38 Fluid Pack Lot # [id number]   CHOLECYSTECTOMY  2014   COLONOSCOPY  2014   COLONOSCOPY WITH ESOPHAGOGASTRODUODENOSCOPY (EGD)  2004    COLONOSCOPY WITH PROPOFOL  N/A 11/07/2018   Procedure: COLONOSCOPY WITH PROPOFOL ;  Surgeon: Gaylyn Gladis PENNER, MD;  Location: [facility] ENDOSCOPY;  Service: Endoscopy;  Laterality: N/A;   COLONOSCOPY WITH PROPOFOL  N/A 12/16/2023   Procedure: COLONOSCOPY WITH PROPOFOL ;  Surgeon: Maryruth Ole DASEN, MD;  Location: [facility] ENDOSCOPY;  Service: Endoscopy;  Laterality: N/A;   CORONARY STENT INTERVENTION N/A 11/23/2024   Procedure: CORONARY STENT INTERVENTION;  Surgeon: Ammon Blunt, MD;  Location: [facility] INVASIVE CV LAB;  Service: Cardiovascular;  Laterality: N/A;   DILATION AND CURETTAGE OF UTERUS     EXTRACORPOREAL SHOCK WAVE LITHOTRIPSY     EYE SURGERY     GANGLION CYST EXCISION Right    wrist   HALLUX VALGUS LAPIDUS Left 08/11/2022   Procedure: HALLUX VALGUS LAPIDUS;  Surgeon: Ashley Soulier, DPM;  Location: [facility] ORS;  Service: Podiatry;  Laterality: Left;   KIDNEY STONE SURGERY     several   KNEE ARTHROSCOPY Left 2014   partial medial meniscectomy, medial chondroplasty   LEFT HEART CATH AND CORONARY ANGIOGRAPHY N/A 11/23/2024   Procedure: LEFT HEART CATH AND CORONARY ANGIOGRAPHY;  Surgeon: Ammon Blunt, MD;  Location: [facility] INVASIVE CV LAB;  Service: Cardiovascular;  Laterality: N/A;   NM PET TUMOR IMAGING  02/07/2023   TOTAL ABDOMINAL HYSTERECTOMY W/ BILATERAL SALPINGOOPHORECTOMY  2009    Past Gynecologic History:  G2P2 Post menopausal Menarche: age 62  OB History:  OB History  No obstetric history on file.   Family History: Family History  Problem Relation Age of Onset   Breast cancer Paternal Aunt    Social History: Social History   Socioeconomic History   Marital  status: Married    Spouse name: Lamar   Number of children: 2   Years of education: Not on file   Highest education level: Not on file  Occupational History   Not on file  Tobacco Use   Smoking status: Never   Smokeless tobacco: Never  Vaping Use   Vaping status: Never Used  Substance and  Sexual Activity   Alcohol use: No   Drug use: No   Sexual activity: Not on file  Other Topics Concern   Not on file  Social History Narrative   Not on file   Social Drivers of Health   Tobacco Use: Low Risk  (12/20/2024)   Received from [facility]   Patient History    Smoking Tobacco Use: Never    Smokeless Tobacco Use: Never    Passive Exposure: Not on file  Financial Resource Strain: Low Risk  (10/30/2024)   Received from [facility]   Overall Financial Resource Strain (CARDIA)    Difficulty of Paying Living Expenses: Not hard at all  Food Insecurity: No Food Insecurity (11/23/2024)   Epic    Worried About Radiation Protection Practitioner of Food in the Last Year: Never true    Ran Out of Food in the Last Year: Never true  Transportation Needs: No Transportation Needs (11/23/2024)   Epic    Lack of Transportation (Medical): No    Lack of Transportation (Non-Medical): No  Physical Activity: Not on file  Stress: Not on file  Social Connections: Moderately Integrated (11/23/2024)   Social Connection and Isolation Panel    Frequency of Communication with Friends and Family: More than three times a week    Frequency of Social Gatherings with Friends and Family: Patient declined    Attends Religious Services: More than 4 times per year    Active Member of [employer] or Organizations: No    Attends Banker Meetings: Never    Marital Status: Married  Catering Manager Violence: Not At Risk (11/23/2024)   Epic    Fear of Current or Ex-Partner: No    Emotionally Abused: No    Physically Abused: No    Sexually Abused: No  Depression (PHQ2-9): Low Risk (12/19/2024)   Depression (PHQ2-9)    PHQ-2 Score: 1  Alcohol Screen: Not on file  Housing: Low Risk (11/23/2024)   Epic    Unable to Pay for Housing in the Last Year: No    Number of Times Moved in the Last Year: 0    Homeless in the Last Year: No  Utilities: Not At Risk (11/23/2024)   Epic     Threatened with loss of utilities: No  Health Literacy: Not on file   Allergies: Allergies  Allergen Reactions   Statins Other (See Comments)    Myalgia    Current Medications: Current Outpatient Medications  Medication Sig Dispense Refill   ascorbic acid (VITAMIN C) 500 MG tablet Take 500 mg by mouth daily. Patient states she takes it October - April.     calcium  carbonate (OS-CAL - DOSED IN MG OF ELEMENTAL CALCIUM ) 1250 (500 Ca) MG tablet Take by mouth.     cholecalciferol (VITAMIN D3) 25 MCG (1000 UNIT) tablet Take 1,000 Units by mouth daily.     clopidogrel (PLAVIX) 75 MG tablet Take 75 mg by mouth.     Coenzyme Q10 (CO Q-10) 100 MG CAPS Take 100 mg by mouth daily.     Cranberry 500 MG TABS Take by mouth.  ezetimibe  (ZETIA ) 10 MG tablet Take 10 mg by mouth daily.     Glucosamine-Chondroitin 750-600 MG TABS Take 1 tablet by mouth daily.     hydrochlorothiazide  (HYDRODIURIL ) 25 MG tablet Take 12.5 mg by mouth daily.     Krill Oil 1000 MG CAPS Take by mouth.     letrozole  (FEMARA ) 2.5 MG tablet TAKE 1 TABLET(2.5 MG) BY MOUTH DAILY 90 tablet 3   levothyroxine  (SYNTHROID , LEVOTHROID) 25 MCG tablet Take 25 mcg by mouth daily before breakfast.     losartan  (COZAAR ) 50 MG tablet Take 1 tablet by mouth daily.     Multiple Vitamins-Minerals (WOMENS 50+ MULTI VITAMIN PO) Take by mouth.     pantoprazole  (PROTONIX ) 40 MG tablet Take 40 mg by mouth every other day.     rosuvastatin  (CRESTOR ) 20 MG tablet Take 1 tablet (20 mg total) by mouth every evening. 30 tablet 0   sertraline  (ZOLOFT ) 50 MG tablet Take 50 mg by mouth daily.     albuterol  (VENTOLIN  HFA) 108 (90 Base) MCG/ACT inhaler Inhale 2 puffs into the lungs every 6 (six) hours as needed for wheezing. (Patient not taking: Reported on 01/16/2025)     aspirin  EC 81 MG tablet Take 81 mg by mouth daily.  (Patient not taking: Reported on 01/16/2025)     prasugrel  (EFFIENT ) 10 MG TABS tablet Take 1 tablet (10 mg total) by mouth daily. (Patient  not taking: Reported on 01/16/2025) 30 tablet 0   No current facility-administered medications for this visit.   Review of Systems General: no complaints  HEENT: no complaints  Lungs: shortness of breath and dry cough; PFTs normal stress test scheduled for tomorrow  Cardiac: no complaints  GI: no complaints  GU: no complaints  GYN: vaginal spotting/discharge occasional Musculoskeletal: left back pain  Extremities: no complaints  Skin: no complaints  Neuro: no complaints  Endocrine: no complaints  Psych: no complaints        Objective:  Physical Examination:  BP 127/66   Pulse 76   Temp 98.6 F (37 C)   Resp 20   Wt 203 lb 12.8 oz (92.4 kg)   SpO2 100%   BMI 34.44 kg/m    PERFORMANCE STATUS: 1  GENERAL: Patient is a well appearing female in no acute distress HEENT:  Atraumatic and normocephalic.  NODES:  No cervical, supraclavicular, axillary, or inguinal lymphadenopathy palpated. The node on the left axilla is palpable but less than 1 cm LUNGS: normal respiratory effort ABDOMEN:  Soft, nontender. Nondistended. No masses/ascites/hernia/or hepatomegaly.  EXTREMITIES:  No significant peripheral edema.   NEURO:  Nonfocal. Well oriented.  Appropriate affect.  Pelvic: exam with RN EGBUS: no lesions Cervix: surgically absent Vagina: small amount of dark blood present with no lesions, no discharge. On palpation foreshortened- ~ 4 cm. Severe agglutination and atrophy. On palpation scarring on the right and then the canal extends higher on the left approximately 2 cm.  Uterus: surgically absent BME: no palpable masses Rectovaginal: deferred  Lab Review No labs on site today  Radiologic Imaging: As per HPI  Assessment:  Morgan Dalton is a 74 y.o. female diagnosed with grade 1 (p53wt, ER+, pMMR) deeply invasive endometrioid endometrial cancer in 2009, isolated vaginal recurrence status post radiation therapy and continues letrozole . May 2025 CT noted prominent 7 mm left  axillary LN but otherwise no evidence of disease. 08/08/24 PET was independently reviewed and left axillary LN is hypermetabolic. No evidence of recurrent disease otherwise. Tolerating letrozole  well and appears  to be having an excellent response with essentially stable disease.   Coronary stent placed about 6 weeks ago and started on baby ASA and Pasugrel.  Started having vaginal bleeding.  She was switched to just Plavix and bleeding improving. No lesions seen in vagina, PAP done.   Vaginal atrophy- severe radiation changes, with vaginal stenosis improved with dilator therapy, and atrophy with intermitent spotting. No evidence of vaginal recurrence on exam or imaging.   Osteoporosis- on prolia per endocrinology.   Low back Pain with history of right sacral wing insufficiency fracture- resolved.   Thoracic aortic aneursym- measuring 4.3 cm in diameter. New.   History 3 mm pulmonary nodule- considered benign. Not hypermetabolic on PET or changed in size on recent imaging.   Left lower pole caliectasis with a 2.1 cm staghorn type calculus, asymptomatic  Left lower extremity swelling- negative for dvt May 2025. Followed by vascular.   Medical co-morbidities complicating care: diverticulitis, hypothyroid.  Plan:   Problem List Items Addressed This Visit       Genitourinary   Malignant neoplasm of corpus uteri (HCC) - Primary   PAP done today to further rule out vaginal recurrence of cancer. Will inform her of results when available.   Continue letrozole  2.5 mg daily as she is tolerating therapy well. Dr. Luevenia mentioned radiation, however, given the small size of only 8 mm axillary node will continue hormonal therapy.  CT scan ordered in three weeks.   She will continue to have bone density scans per endocrinology.  Recommend repeating in 2026 or 2027.  She will continue Prolia along with calcium  and vitamin D as recommended.  Encouraged weightbearing exercise as tolerated.  Sacral  insufficiency fracture-follow-up with endocrinology.  3 mm pulmonary nodule-continues to be stable on imaging.  Suspect benign etiology.  Will monitor on follow-up imaging.  Left lower extremity edema-negative for DVT.  She was followed by vascular for lymphedema.  She can return to them if she would like to consider laser therapy or has other complaints.  Aortic aneurysm-new on PET scan.  Now normal ascending thoracic aortic diameter when remeasured. We reached out to vascular surgery who recommends that no intervention is recommended until it measures greater than 6 cm.  Will plan to evaluate with imaging annually.  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 Precautions provided.   Disposition: 3 months CT chest/abdomen/pelvis  The patient's diagnosis, an outline of the further diagnostic and laboratory studies which will be required, the recommendation for surgery, and alternatives were discussed with her.  All questions were answered to their satisfaction.   Prentice Agent

## 2025-01-16 NOTE — Progress Notes (Signed)
 Daily Session Note  Patient Details  Name: Morgan Dalton MRN: [id number] Date of Birth: 1951-03-28 Referring Provider:   Flowsheet Row Cardiac Rehab from 12/19/2024 in [facility] Cardiac and Pulmonary Rehab  Referring Provider Dr. Ammon    Encounter Date: 01/16/2025  Check In:  Session Check In - 01/16/25 1023       Check-In   Supervising physician immediately available to respond to emergencies See telemetry face sheet for immediately available ER MD    Location [facility]-Cardiac & Pulmonary Rehab    Staff Present Leita Franks RN,BSN;Joseph [facility] BS, Exercise Physiologist;Margaret Best, MS, Exercise Physiologist    Virtual Visit No    Medication changes reported     No    Fall or balance concerns reported    No    Warm-up and Cool-down Performed on first and last piece of equipment    Resistance Training Performed Yes    VAD Patient? No    PAD/SET Patient? No      Pain Assessment   Currently in Pain? No/denies             Tobacco Use History[1]  Goals Met:  Independence with exercise equipment Exercise tolerated well No report of concerns or symptoms today Strength training completed today  Goals Unmet:  Not Applicable  Comments: Pt able to follow exercise prescription today without complaint.  Will continue to monitor for progression.    Dr. Oneil Pinal is Medical Director for [facility] Cardiac Rehabilitation.  Dr. Fuad Aleskerov is Medical Director for [facility] Pulmonary Rehabilitation.    [1]  Social History Tobacco Use  Smoking Status Never  Smokeless Tobacco Never

## 2025-01-18 ENCOUNTER — Encounter

## 2025-01-18 DIAGNOSIS — Z955 Presence of coronary angioplasty implant and graft: Secondary | ICD-10-CM

## 2025-01-18 NOTE — Progress Notes (Signed)
 Daily Session Note  Patient Details  Name: Morgan Dalton MRN: [id number] Date of Birth: [birth year] Referring Provider:   Flowsheet Row Cardiac Rehab from 12/19/2024 in [facility] Cardiac and Pulmonary Rehab  Referring Provider Dr. Ammon    Encounter Date: 01/18/2025  Check In:  Session Check In - 01/18/25 0921       Check-In   Supervising physician immediately available to respond to emergencies See telemetry face sheet for immediately available ER MD    Location [facility]-Cardiac & Pulmonary Rehab    Staff Present Burnard Davenport RN,BSN,MPA;Maxon Conetta BS, Exercise Physiologist;Joseph Rolinda NORWOOD HARMAN Verlie Laird, [place], Exercise Physiologist    Virtual Visit No    Medication changes reported     No    Fall or balance concerns reported    No    Warm-up and Cool-down Performed on first and last piece of equipment    Resistance Training Performed Yes    VAD Patient? No    PAD/SET Patient? No      Pain Assessment   Currently in Pain? No/denies             Tobacco Use History[1]  Goals Met:  Independence with exercise equipment Exercise tolerated well No report of concerns or symptoms today Strength training completed today  Goals Unmet:  Not Applicable  Comments: Pt able to follow exercise prescription today without complaint.  Will continue to monitor for progression.    Dr. Oneil Pinal is Medical Director for [facility] Cardiac Rehabilitation.  Dr. Fuad Aleskerov is Medical Director for [facility] Pulmonary Rehabilitation.    [1]  Social History Tobacco Use  Smoking Status Never  Smokeless Tobacco Never

## 2025-01-21 ENCOUNTER — Encounter

## 2025-01-23 ENCOUNTER — Encounter

## 2025-01-25 ENCOUNTER — Encounter

## 2025-01-28 ENCOUNTER — Encounter

## 2025-01-30 ENCOUNTER — Encounter

## 2025-02-01 ENCOUNTER — Encounter

## 2025-02-04 ENCOUNTER — Encounter

## 2025-02-05 ENCOUNTER — Ambulatory Visit

## 2025-02-06 ENCOUNTER — Encounter

## 2025-02-06 ENCOUNTER — Ambulatory Visit

## 2025-02-08 ENCOUNTER — Encounter

## 2025-02-11 ENCOUNTER — Encounter

## 2025-02-13 ENCOUNTER — Encounter

## 2025-02-15 ENCOUNTER — Encounter

## 2025-02-18 ENCOUNTER — Encounter

## 2025-02-20 ENCOUNTER — Inpatient Hospital Stay

## 2025-02-20 ENCOUNTER — Encounter

## 2025-02-22 ENCOUNTER — Encounter

## 2025-02-25 ENCOUNTER — Encounter

## 2025-02-27 ENCOUNTER — Inpatient Hospital Stay

## 2025-02-27 ENCOUNTER — Encounter

## 2025-03-01 ENCOUNTER — Encounter

## 2025-03-04 ENCOUNTER — Encounter

## 2025-03-06 ENCOUNTER — Encounter

## 2025-03-08 ENCOUNTER — Encounter

## 2025-03-11 ENCOUNTER — Encounter

## 2025-03-13 ENCOUNTER — Encounter

## 2025-03-15 ENCOUNTER — Encounter

## 2025-03-18 ENCOUNTER — Encounter

## 2025-04-17 ENCOUNTER — Inpatient Hospital Stay

## 2025-05-02 ENCOUNTER — Ambulatory Visit: Admitting: Radiation Oncology

## 2025-07-05 ENCOUNTER — Other Ambulatory Visit: Admitting: Urology

## 2025-09-17 ENCOUNTER — Ambulatory Visit: Admitting: Dermatology
